# Patient Record
Sex: Female | Born: 1950 | Race: White | Hispanic: No | Smoking: Never smoker
Health system: Southern US, Community
[De-identification: ages and names within clinical notes are randomized; demographics above are authoritative.]

## PROBLEM LIST (undated history)

## (undated) DIAGNOSIS — F330 Major depressive disorder, recurrent, mild: Secondary | ICD-10-CM

## (undated) DIAGNOSIS — G4730 Sleep apnea, unspecified: Secondary | ICD-10-CM

## (undated) DIAGNOSIS — J8489 Other specified interstitial pulmonary diseases: Secondary | ICD-10-CM

## (undated) DIAGNOSIS — K589 Irritable bowel syndrome without diarrhea: Secondary | ICD-10-CM

## (undated) DIAGNOSIS — E782 Mixed hyperlipidemia: Secondary | ICD-10-CM

## (undated) DIAGNOSIS — R519 Headache, unspecified: Secondary | ICD-10-CM

## (undated) DIAGNOSIS — M51379 Other intervertebral disc degeneration, lumbosacral region without mention of lumbar back pain or lower extremity pain: Secondary | ICD-10-CM

## (undated) DIAGNOSIS — E042 Nontoxic multinodular goiter: Secondary | ICD-10-CM

## (undated) DIAGNOSIS — K219 Gastro-esophageal reflux disease without esophagitis: Secondary | ICD-10-CM

## (undated) DIAGNOSIS — F32A Depression, unspecified: Secondary | ICD-10-CM

## (undated) DIAGNOSIS — J45909 Unspecified asthma, uncomplicated: Secondary | ICD-10-CM

## (undated) DIAGNOSIS — I10 Essential (primary) hypertension: Secondary | ICD-10-CM

## (undated) DIAGNOSIS — U071 COVID-19: Secondary | ICD-10-CM

## (undated) HISTORY — PX: ABDOMINAL HYSTERECTOMY: SHX81

## (undated) HISTORY — PX: CHOLECYSTECTOMY: SHX55

## (undated) HISTORY — PX: OTHER SURGICAL HISTORY: SHX169

## (undated) HISTORY — PX: FRENULECTOMY, LINGUAL: SHX1681

## (undated) HISTORY — PX: APPENDECTOMY: SHX54

## (undated) HISTORY — PX: ELBOW BURSA SURGERY: SHX615

## (undated) HISTORY — PX: OOPHORECTOMY: SHX86

---

## 1994-12-27 HISTORY — PX: LUMBAR DISC SURGERY: SHX700

## 1996-12-27 HISTORY — PX: LUMBAR LAMINECTOMY: SHX95

## 1999-12-28 HISTORY — PX: UVULOPALATOPHARYNGOPLASTY: SHX827

## 2002-12-27 HISTORY — PX: LUMBAR DISC SURGERY: SHX700

## 2004-12-21 ENCOUNTER — Emergency Department: Payer: Self-pay | Admitting: Emergency Medicine

## 2004-12-27 HISTORY — PX: LUMBAR FUSION: SHX111

## 2005-01-01 ENCOUNTER — Ambulatory Visit: Payer: Self-pay | Admitting: Orthopedic Surgery

## 2005-01-14 ENCOUNTER — Inpatient Hospital Stay (HOSPITAL_COMMUNITY): Admission: RE | Admit: 2005-01-14 | Discharge: 2005-01-17 | Payer: Self-pay | Admitting: Orthopedic Surgery

## 2005-04-14 ENCOUNTER — Ambulatory Visit: Payer: Self-pay | Admitting: Otolaryngology

## 2005-05-15 ENCOUNTER — Inpatient Hospital Stay: Payer: Self-pay | Admitting: Internal Medicine

## 2005-05-15 ENCOUNTER — Other Ambulatory Visit: Payer: Self-pay

## 2005-05-16 ENCOUNTER — Other Ambulatory Visit: Payer: Self-pay

## 2005-10-25 ENCOUNTER — Inpatient Hospital Stay (HOSPITAL_COMMUNITY): Admission: RE | Admit: 2005-10-25 | Discharge: 2005-11-01 | Payer: Self-pay | Admitting: Orthopedic Surgery

## 2005-11-19 ENCOUNTER — Ambulatory Visit: Payer: Self-pay

## 2005-11-29 ENCOUNTER — Ambulatory Visit: Payer: Self-pay | Admitting: Physical Medicine & Rehabilitation

## 2005-11-29 ENCOUNTER — Inpatient Hospital Stay (HOSPITAL_COMMUNITY): Admission: RE | Admit: 2005-11-29 | Discharge: 2005-12-29 | Payer: Self-pay | Admitting: Orthopedic Surgery

## 2006-01-03 ENCOUNTER — Ambulatory Visit: Payer: Self-pay

## 2006-01-06 ENCOUNTER — Ambulatory Visit: Payer: Self-pay

## 2006-01-10 ENCOUNTER — Ambulatory Visit: Payer: Self-pay

## 2006-01-13 ENCOUNTER — Ambulatory Visit: Payer: Self-pay

## 2006-01-26 ENCOUNTER — Ambulatory Visit: Payer: Self-pay | Admitting: Internal Medicine

## 2008-07-16 ENCOUNTER — Ambulatory Visit: Payer: Self-pay | Admitting: Internal Medicine

## 2008-10-08 ENCOUNTER — Ambulatory Visit: Payer: Self-pay | Admitting: Unknown Physician Specialty

## 2009-03-09 ENCOUNTER — Emergency Department: Payer: Self-pay

## 2009-10-15 ENCOUNTER — Ambulatory Visit: Payer: Self-pay | Admitting: Internal Medicine

## 2011-01-05 ENCOUNTER — Ambulatory Visit: Payer: Self-pay | Admitting: Internal Medicine

## 2012-02-28 ENCOUNTER — Ambulatory Visit: Payer: Self-pay | Admitting: Internal Medicine

## 2013-08-22 ENCOUNTER — Ambulatory Visit: Payer: Self-pay | Admitting: Family Medicine

## 2014-05-30 ENCOUNTER — Ambulatory Visit: Payer: Self-pay | Admitting: Unknown Physician Specialty

## 2014-05-31 LAB — PATHOLOGY REPORT

## 2014-09-10 ENCOUNTER — Ambulatory Visit: Payer: Self-pay | Admitting: Internal Medicine

## 2017-11-24 ENCOUNTER — Other Ambulatory Visit: Payer: Self-pay | Admitting: Internal Medicine

## 2017-11-24 DIAGNOSIS — Z1231 Encounter for screening mammogram for malignant neoplasm of breast: Secondary | ICD-10-CM

## 2017-12-29 ENCOUNTER — Ambulatory Visit: Payer: Medicare Other | Attending: Otolaryngology

## 2017-12-29 DIAGNOSIS — R0683 Snoring: Secondary | ICD-10-CM | POA: Diagnosis not present

## 2017-12-29 DIAGNOSIS — I491 Atrial premature depolarization: Secondary | ICD-10-CM | POA: Insufficient documentation

## 2017-12-29 DIAGNOSIS — G4733 Obstructive sleep apnea (adult) (pediatric): Secondary | ICD-10-CM | POA: Insufficient documentation

## 2018-01-18 ENCOUNTER — Ambulatory Visit
Admission: RE | Admit: 2018-01-18 | Discharge: 2018-01-18 | Disposition: A | Discharge status: Home / Self Care | Payer: Medicare Other | Source: Ambulatory Visit | Attending: Internal Medicine | Admitting: Internal Medicine

## 2018-01-18 ENCOUNTER — Encounter: Payer: Self-pay | Admitting: Radiology

## 2018-01-18 DIAGNOSIS — Z1231 Encounter for screening mammogram for malignant neoplasm of breast: Secondary | ICD-10-CM | POA: Diagnosis not present

## 2018-09-26 ENCOUNTER — Emergency Department: Payer: Medicare Other

## 2018-09-26 ENCOUNTER — Encounter: Payer: Self-pay | Admitting: Emergency Medicine

## 2018-09-26 ENCOUNTER — Emergency Department
Admission: EM | Admit: 2018-09-26 | Discharge: 2018-09-26 | Disposition: A | Discharge status: Home / Self Care | Payer: Medicare Other | Attending: Emergency Medicine | Admitting: Emergency Medicine

## 2018-09-26 DIAGNOSIS — E860 Dehydration: Secondary | ICD-10-CM | POA: Diagnosis not present

## 2018-09-26 DIAGNOSIS — R42 Dizziness and giddiness: Secondary | ICD-10-CM | POA: Diagnosis present

## 2018-09-26 DIAGNOSIS — I10 Essential (primary) hypertension: Secondary | ICD-10-CM | POA: Diagnosis not present

## 2018-09-26 DIAGNOSIS — R55 Syncope and collapse: Secondary | ICD-10-CM | POA: Diagnosis not present

## 2018-09-26 LAB — CBC
HEMATOCRIT: 38.5 % (ref 35.0–47.0)
HEMOGLOBIN: 13.5 g/dL (ref 12.0–16.0)
MCH: 33 pg (ref 26.0–34.0)
MCHC: 35 g/dL (ref 32.0–36.0)
MCV: 94.1 fL (ref 80.0–100.0)
Platelets: 268 10*3/uL (ref 150–440)
RBC: 4.09 MIL/uL (ref 3.80–5.20)
RDW: 13.3 % (ref 11.5–14.5)
WBC: 8 10*3/uL (ref 3.6–11.0)

## 2018-09-26 LAB — URINALYSIS, COMPLETE (UACMP) WITH MICROSCOPIC
BILIRUBIN URINE: NEGATIVE
Glucose, UA: NEGATIVE mg/dL
HGB URINE DIPSTICK: NEGATIVE
Ketones, ur: NEGATIVE mg/dL
Leukocytes, UA: NEGATIVE
Nitrite: NEGATIVE
PROTEIN: NEGATIVE mg/dL
Specific Gravity, Urine: 1.011 (ref 1.005–1.030)
pH: 6 (ref 5.0–8.0)

## 2018-09-26 LAB — TROPONIN I

## 2018-09-26 LAB — BASIC METABOLIC PANEL
ANION GAP: 8 (ref 5–15)
BUN: 23 mg/dL (ref 8–23)
CALCIUM: 9.1 mg/dL (ref 8.9–10.3)
CO2: 26 mmol/L (ref 22–32)
Chloride: 106 mmol/L (ref 98–111)
Creatinine, Ser: 1.27 mg/dL — ABNORMAL HIGH (ref 0.44–1.00)
GFR calc non Af Amer: 43 mL/min — ABNORMAL LOW (ref >60)
GFR, EST AFRICAN AMERICAN: 50 mL/min — AB (ref >60)
GLUCOSE: 89 mg/dL (ref 70–99)
POTASSIUM: 4.2 mmol/L (ref 3.5–5.1)
Sodium: 140 mmol/L (ref 135–145)

## 2018-09-26 LAB — GLUCOSE, CAPILLARY: GLUCOSE-CAPILLARY: 83 mg/dL (ref 70–99)

## 2018-09-26 LAB — TSH: TSH: 3.956 u[IU]/mL (ref 0.350–4.500)

## 2018-09-26 LAB — T4, FREE: Free T4: 0.68 ng/dL — ABNORMAL LOW (ref 0.82–1.77)

## 2018-09-26 MED ORDER — SODIUM CHLORIDE 0.9 % IV BOLUS
1000.0000 mL | Freq: Once | INTRAVENOUS | Status: AC
  Administered 2018-09-26: 1000 mL via INTRAVENOUS

## 2018-09-26 NOTE — ED Triage Notes (Signed)
Pt had a sudden onset of shortness of breath and dizziness this morning. Pt attributed it to her blood sugar and ate an early lunch. Pt states she didn't feel any better and felt "foggy." Pt checked her blood sugar and it was 93 and bp was 90/42. Pt states she has had increased weakness/dizziness of the last few weeks. Pt states it's debilitated her from performing her ADLS

## 2018-09-26 NOTE — ED Notes (Signed)
Pt describes a feeling "like my blood sugar was low" with some generalized weakness and dizziness and diaphoresis followed by some slight sob which was not resolved by eating.  Pt reports her CBG was 93 when she checked it.  Pt reports some generalized weakness and fatigue ongoing for 6 weeks.  No focal weakness, numbness, no facial droop, no change in vision.  Pt is alert and oriented

## 2018-09-26 NOTE — ED Provider Notes (Signed)
[facility] Emergency Department Provider Note  ____________________________________________  Time seen: Approximately 8:39 PM  I have reviewed the triage vital signs and the nursing notes.   HISTORY  Chief Complaint Dizziness   HPI Jody Brewer is a 67 y.o. female with a history of OSA on CPAP, hypertension no longer medications since weight loss, IBS, and asthma who presents for evaluation of dizziness.  Patient reports that since the summer she has been having episodes of feeling off balance.  These episodes last a second or 2.  Over the last 2 weeks they have become more pronounced.  She has been extremely fatigued for 2 weeks and has not gone to the gym which she usually goes regularly.  Today she was vacuuming and she started feeling dizzy like she was going to pass out, she broke out in a sweat and felt clammy.  She felt that her sugar was down.  Patient reports having a history of hypoglycemia since losing 70 pounds several years ago.  She reports that she had a full meal which usually makes her feel better but she was not better.  She then check her blood glucose which was 85 and she thought it was low considering she had just had a full meal.  She check her blood pressure which was also low with systolics in the 90s.  Patient reports that her blood pressure at home is usually in the 130s.  She reports blurry vision and reports that that has been progressively worse over several months.  No headache although she does have a history of chronic headaches due to sinus infections, no chest pain, no palpitations, no shortness of breath, no abdominal pain, no nausea, no vomiting.  No personal family history of stroke.  Patient is not a smoker.  PMH OSA on CPAP 11/07/2017  LPRD (laryngopharyngeal reflux disease) 03/19/2015  Benign essential HTN 09/18/2014  DDD (degenerative disc disease), lumbosacral 09/18/2014  IBS (irritable bowel syndrome)      Past  Surgical History:  Procedure Laterality Date  . ABDOMINAL HYSTERECTOMY    . OOPHORECTOMY     1 ovary removed    Prior to Admission medications   Not on File    Allergies Patient has no allergy information on record.  FH High blood pressure (Hypertension) Brother    Alzheimer's disease Father    High blood pressure (Hypertension) Father    Parkinsonism Father    Emphysema Mother    High blood pressure (Hypertension) Sister Sister   High blood pressure (Hypertension) Sister       Social History Smoking - never Alcohol - no Drugs - no  Review of Systems  Constitutional: Negative for fever. + dizziness, near syncope, fatigue Eyes: Negative for visual changes. ENT: Negative for sore throat. Neck: No neck pain  Cardiovascular: Negative for chest pain. Respiratory: Negative for shortness of breath. Gastrointestinal: Negative for abdominal pain, vomiting or diarrhea. Genitourinary: Negative for dysuria. Musculoskeletal: Negative for back pain. Skin: Negative for rash. Neurological: Negative for headaches, weakness or numbness. Psych: No SI or HI  ____________________________________________   PHYSICAL EXAM:  VITAL SIGNS: ED Triage Vitals  Enc Vitals Group     BP 09/26/18 1535 136/60     Pulse Rate 09/26/18 1535 70     Resp 09/26/18 1535 18     Temp 09/26/18 1535 98.7 F (37.1 C)     Temp Source 09/26/18 1535 Oral     SpO2 09/26/18 1535 98 %  Weight 09/26/18 1535 168 lb (76.2 kg)     Height 09/26/18 1535 5\' 7"  (1.702 m)     Head Circumference --      Peak Flow --      Pain Score 09/26/18 1541 0     Pain Loc --      Pain Edu? --      Excl. in GC? --     Constitutional: Alert and oriented. Well appearing and in no apparent distress. HEENT:      Head: Normocephalic and atraumatic.         Eyes: Conjunctivae are normal. Sclera is non-icteric.       Mouth/Throat: Mucous membranes are moist.       Neck: Supple with no signs of  meningismus. Cardiovascular: Regular rate and rhythm. No murmurs, gallops, or rubs. 2+ symmetrical distal pulses are present in all extremities. No JVD. Respiratory: Normal respiratory effort. Lungs are clear to auscultation bilaterally. No wheezes, crackles, or rhonchi.  Gastrointestinal: Soft, non tender, and non distended with positive bowel sounds. No rebound or guarding. Musculoskeletal: Nontender with normal range of motion in all extremities. No edema, cyanosis, or erythema of extremities. Neurologic: Normal speech and language. Face is symmetric.  Intact strength and sensation all 4 extremities, patient has very slight right upper extremity pronator drift, no dysmetria. Skin: Skin is warm, dry and intact. No rash noted. Psychiatric: Mood and affect are normal. Speech and behavior are normal.  ____________________________________________   LABS (all labs ordered are listed, but only abnormal results are displayed)  Labs Reviewed  BASIC METABOLIC PANEL - Abnormal; Notable for the following components:      Result Value   Creatinine, Ser 1.27 (*)    GFR calc non Af Amer 43 (*)    GFR calc Af Amer 50 (*)    All other components within normal limits  URINALYSIS, COMPLETE (UACMP) WITH MICROSCOPIC - Abnormal; Notable for the following components:   Color, Urine STRAW (*)    APPearance CLEAR (*)    Bacteria, UA RARE (*)    All other components within normal limits  T4, FREE - Abnormal; Notable for the following components:   Free T4 0.68 (*)    All other components within normal limits  CBC  GLUCOSE, CAPILLARY  TROPONIN I  TSH  CBG MONITORING, ED   ____________________________________________  EKG  ED ECG REPORT I, Nita Sickle, the attending physician, personally viewed and interpreted this ECG.  Normal sinus rhythm, rate of 74, normal intervals, normal axis, no ST elevations or depressions. Normal EKG ____________________________________________  RADIOLOGY  I  have personally reviewed the images performed during this visit and I agree with the Radiologist's read.   Interpretation by Radiologist:  Ct Head Wo Contrast  Result Date: 09/26/2018 CLINICAL DATA:  Acute weakness and dizziness, vertigo EXAM: CT HEAD WITHOUT CONTRAST TECHNIQUE: Contiguous axial images were obtained from the base of the skull through the vertex without intravenous contrast. COMPARISON:  None. FINDINGS: Brain: Minor white matter microvascular ischemic changes about the periventricular white matter and the left frontal subcortical white matter. No acute intracranial hemorrhage, definite new infarction, mass lesion, midline shift, herniation, hydrocephalus, or extra-axial collection. No focal mass effect or edema. Cisterns are patent. Minor cerebellar atrophy. Vascular: No hyperdense vessel or unexpected calcification. Skull: Normal. Negative for fracture or focal lesion. Sinuses/Orbits: No acute finding. Other: None. IMPRESSION: Minor chronic white matter microvascular ischemic changes. No acute intracranial abnormality by noncontrast CT. Electronically Signed   By: Judie Petit.  Shick M.D.   On: 09/26/2018 20:34   Mr Brain Wo Contrast  Result Date: 09/26/2018 CLINICAL DATA:  67 y/o F; sudden onset shortness of breath, dizziness, and weakness starting this morning. Vertigo, episodic, peripheral. EXAM: MRI HEAD WITHOUT CONTRAST TECHNIQUE: Multiplanar, multiecho pulse sequences of the brain and surrounding structures were obtained without intravenous contrast. COMPARISON:  09/26/2018 CT head. FINDINGS: Brain: No acute infarction, hemorrhage, hydrocephalus, extra-axial collection or mass lesion. 5 mm well-circumscribed structure with fluid signal on all sequences centered in left lateral frontoparietal white matter without surrounding signal abnormality (series 10, image 17 and series 9, image 17). Few nonspecific T2 FLAIR hyperintensities in subcortical and periventricular white matter compatible with  mild chronic microvascular ischemic changes and can be seen associated with migraine headache. Mild volume loss of the brain for age. Vascular: Normal flow voids. Skull and upper cervical spine: Normal marrow signal. Sinuses/Orbits: Negative. Other: None. IMPRESSION: 1. No acute intracranial abnormality identified. 2. Few nonspecific T2 FLAIR hyperintensities in subcortical and periventricular white matter compatible with mild chronic microvascular ischemic changes and can be seen associated with migraine headache. 3. Mild volume loss of the brain for age. 4. 5 mm cyst in left frontoparietal white matter, likely a benign cyst such as neuroglial cyst or prominent perivascular space. Electronically Signed   By: Mitzi Hansen M.D.   On: 09/26/2018 22:42    ____________________________________________   PROCEDURES  Procedure(s) performed: None Procedures Critical Care performed:  None ____________________________________________   INITIAL IMPRESSION / ASSESSMENT AND PLAN / ED COURSE  a 67 y.o. female with a history of OSA on CPAP, hypertension no longer medications since weight loss, IBS, and asthma who presents for evaluation of dizziness, lightheadedness, fatigue, and a near syncopal episode today.  Patient is well-appearing, no distress she has normal vital signs, neuro exam shows right upper extremity pronator drift and no other neurological deficits.  Patient is complaining of blurry vision and very short-lived episodes of feeling off balance for several months therefore we will send patient for head CT and if that is negative we will most likely pursue an MRI to rule out stroke versus brain mass.  Will check labs to rule out thyroid dysfunction, anemia, AKI, electrolyte abnormalities, urinary tract infection.  EKG shows no evidence of dysrhythmias or ischemia.    Clinical Course as of Sep 26 2309  Tue Sep 26, 2018  2046 Labs showing mild acute kidney injury.  Will give IV fluids.   CT head is negative but due to neuro deficit found on exam we will proceed with an MRI.   [CV]  2308 MRI with no evidence of mass or stroke.  Free T4 is slightly low at 0.68 with a normal TSH.  Recommended recheck by primary care doctor in a few days.  Will not start patient on thyroid medicine at this time.  At this time presentation is concerning for mild dehydration and acute kidney injury.  Patient was given fluids.  Will discharge home with follow-up with primary care doctor.  Discussed return precautions and increase oral hydration.   [CV]    Clinical Course User Index [CV] Don Perking [place], MD     As part of my medical decision making, I reviewed the following data within the electronic MEDICAL RECORD NUMBER Nursing notes reviewed and incorporated, Labs reviewed , EKG interpreted , Old EKG reviewed, Old chart reviewed, Radiograph reviewed , Notes from prior ED visits and [place] Controlled Substance Database    Pertinent labs & imaging results that  were available during my care of the patient were reviewed by me and considered in my medical decision making (see chart for details).    ____________________________________________   FINAL CLINICAL IMPRESSION(S) / ED DIAGNOSES  Final diagnoses:  Near syncope  Dehydration      NEW MEDICATIONS STARTED DURING THIS VISIT:  ED Discharge Orders    None       Note:  This document was prepared using Dragon voice recognition software and may include unintentional dictation errors.    Nita Sickle, MD 09/26/18 [id number]

## 2018-11-08 ENCOUNTER — Ambulatory Visit
Admission: RE | Admit: 2018-11-08 | Discharge: 2018-11-08 | Disposition: A | Discharge status: Home / Self Care | Payer: Medicare Other | Source: Ambulatory Visit | Attending: Internal Medicine | Admitting: Internal Medicine

## 2018-11-08 ENCOUNTER — Other Ambulatory Visit: Payer: Self-pay | Admitting: Internal Medicine

## 2018-11-08 DIAGNOSIS — R0609 Other forms of dyspnea: Principal | ICD-10-CM

## 2018-11-08 DIAGNOSIS — I2510 Atherosclerotic heart disease of native coronary artery without angina pectoris: Secondary | ICD-10-CM | POA: Diagnosis not present

## 2018-11-08 MED ORDER — IOHEXOL 350 MG/ML SOLN
75.0000 mL | Freq: Once | INTRAVENOUS | Status: AC | PRN
  Administered 2018-11-08: 75 mL via INTRAVENOUS

## 2018-12-12 ENCOUNTER — Other Ambulatory Visit: Payer: Self-pay | Admitting: Internal Medicine

## 2018-12-12 DIAGNOSIS — Z1231 Encounter for screening mammogram for malignant neoplasm of breast: Secondary | ICD-10-CM

## 2019-01-23 ENCOUNTER — Ambulatory Visit
Admission: RE | Admit: 2019-01-23 | Discharge: 2019-01-23 | Disposition: A | Discharge status: Home / Self Care | Payer: Medicare Other | Source: Ambulatory Visit | Attending: Internal Medicine | Admitting: Internal Medicine

## 2019-01-23 DIAGNOSIS — Z1231 Encounter for screening mammogram for malignant neoplasm of breast: Secondary | ICD-10-CM | POA: Diagnosis not present

## 2019-11-27 DIAGNOSIS — U071 COVID-19: Secondary | ICD-10-CM

## 2019-11-27 HISTORY — DX: COVID-19: U07.1

## 2019-12-27 ENCOUNTER — Other Ambulatory Visit: Payer: Self-pay

## 2019-12-27 ENCOUNTER — Observation Stay
Admission: EM | Admit: 2019-12-27 | Discharge: 2019-12-28 | Disposition: A | Discharge status: Home / Self Care | Payer: Medicare Other | Attending: Family Medicine | Admitting: Family Medicine

## 2019-12-27 ENCOUNTER — Emergency Department: Payer: Medicare Other

## 2019-12-27 DIAGNOSIS — I10 Essential (primary) hypertension: Secondary | ICD-10-CM | POA: Diagnosis not present

## 2019-12-27 DIAGNOSIS — R0602 Shortness of breath: Secondary | ICD-10-CM | POA: Insufficient documentation

## 2019-12-27 DIAGNOSIS — J4520 Mild intermittent asthma, uncomplicated: Secondary | ICD-10-CM | POA: Diagnosis present

## 2019-12-27 DIAGNOSIS — U071 COVID-19: Principal | ICD-10-CM

## 2019-12-27 DIAGNOSIS — J9601 Acute respiratory failure with hypoxia: Secondary | ICD-10-CM

## 2019-12-27 DIAGNOSIS — Z20828 Contact with and (suspected) exposure to other viral communicable diseases: Secondary | ICD-10-CM | POA: Diagnosis not present

## 2019-12-27 DIAGNOSIS — J40 Bronchitis, not specified as acute or chronic: Secondary | ICD-10-CM | POA: Diagnosis not present

## 2019-12-27 DIAGNOSIS — B9729 Other coronavirus as the cause of diseases classified elsewhere: Secondary | ICD-10-CM | POA: Insufficient documentation

## 2019-12-27 HISTORY — DX: Essential (primary) hypertension: I10

## 2019-12-27 HISTORY — DX: Unspecified asthma, uncomplicated: J45.909

## 2019-12-27 LAB — HEPATIC FUNCTION PANEL
ALT: 22 U/L (ref 0–44)
AST: 29 U/L (ref 15–41)
Albumin: 4.1 g/dL (ref 3.5–5.0)
Alkaline Phosphatase: 92 U/L (ref 38–126)
Bilirubin, Direct: <0.1 mg/dL (ref 0.0–0.2)
Total Bilirubin: 0.6 mg/dL (ref 0.3–1.2)
Total Protein: 8.1 g/dL (ref 6.5–8.1)

## 2019-12-27 LAB — BASIC METABOLIC PANEL
Anion gap: 11 (ref 5–15)
BUN: 13 mg/dL (ref 8–23)
CO2: 26 mmol/L (ref 22–32)
Calcium: 9.2 mg/dL (ref 8.9–10.3)
Chloride: 101 mmol/L (ref 98–111)
Creatinine, Ser: 1.05 mg/dL — ABNORMAL HIGH (ref 0.44–1.00)
GFR calc Af Amer: >60 mL/min (ref >60)
GFR calc non Af Amer: 55 mL/min — ABNORMAL LOW (ref >60)
Glucose, Bld: 102 mg/dL — ABNORMAL HIGH (ref 70–99)
Potassium: 3.7 mmol/L (ref 3.5–5.1)
Sodium: 138 mmol/L (ref 135–145)

## 2019-12-27 LAB — CBC
HCT: 42.8 % (ref 36.0–46.0)
Hemoglobin: 14.1 g/dL (ref 12.0–15.0)
MCH: 29.7 pg (ref 26.0–34.0)
MCHC: 32.9 g/dL (ref 30.0–36.0)
MCV: 90.1 fL (ref 80.0–100.0)
Platelets: 252 10*3/uL (ref 150–400)
RBC: 4.75 MIL/uL (ref 3.87–5.11)
RDW: 12.4 % (ref 11.5–15.5)
WBC: 6.8 10*3/uL (ref 4.0–10.5)
nRBC: 0 % (ref 0.0–0.2)

## 2019-12-27 LAB — LACTIC ACID, PLASMA: Lactic Acid, Venous: 1.4 mmol/L (ref 0.5–1.9)

## 2019-12-27 LAB — POC SARS CORONAVIRUS 2 AG: SARS Coronavirus 2 Ag: NEGATIVE

## 2019-12-27 LAB — RESPIRATORY PANEL BY RT PCR (FLU A&B, COVID)
Influenza A by PCR: NEGATIVE
Influenza B by PCR: NEGATIVE
SARS Coronavirus 2 by RT PCR: POSITIVE — AB

## 2019-12-27 MED ORDER — HYDROCHLOROTHIAZIDE 12.5 MG PO CAPS
12.5000 mg | ORAL_CAPSULE | Freq: Every day | ORAL | Status: DC
  Administered 2019-12-27 – 2019-12-28 (×2): 12.5 mg via ORAL
  Filled 2019-12-27 (×4): qty 1

## 2019-12-27 MED ORDER — HYDROCOD POLST-CPM POLST ER 10-8 MG/5ML PO SUER
5.0000 mL | Freq: Two times a day (BID) | ORAL | Status: DC | PRN
  Administered 2019-12-28: 5 mL via ORAL
  Filled 2019-12-27: qty 5

## 2019-12-27 MED ORDER — ENOXAPARIN SODIUM 40 MG/0.4ML ~~LOC~~ SOLN
40.0000 mg | SUBCUTANEOUS | Status: DC
  Administered 2019-12-27: 23:00:00 40 mg via SUBCUTANEOUS
  Filled 2019-12-27 (×2): qty 0.4

## 2019-12-27 MED ORDER — GUAIFENESIN-DM 100-10 MG/5ML PO SYRP
10.0000 mL | ORAL_SOLUTION | ORAL | Status: DC | PRN
  Filled 2019-12-27: qty 10

## 2019-12-27 MED ORDER — ZINC SULFATE 220 (50 ZN) MG PO CAPS
220.0000 mg | ORAL_CAPSULE | Freq: Every day | ORAL | Status: DC
  Administered 2019-12-27 – 2019-12-28 (×2): 220 mg via ORAL
  Filled 2019-12-27 (×2): qty 1

## 2019-12-27 MED ORDER — OLMESARTAN MEDOXOMIL-HCTZ 20-12.5 MG PO TABS: 1.0000 | ORAL_TABLET | Freq: Every day | ORAL | Status: DC

## 2019-12-27 MED ORDER — OXYCODONE HCL 5 MG PO TABS: 5.0000 mg | ORAL_TABLET | ORAL | Status: DC | PRN

## 2019-12-27 MED ORDER — MONTELUKAST SODIUM 10 MG PO TABS
10.0000 mg | ORAL_TABLET | Freq: Every day | ORAL | Status: DC
  Administered 2019-12-27: 10 mg via ORAL
  Filled 2019-12-27 (×3): qty 1

## 2019-12-27 MED ORDER — NORTRIPTYLINE HCL 25 MG PO CAPS
25.0000 mg | ORAL_CAPSULE | Freq: Every day | ORAL | Status: DC
  Administered 2019-12-27: 25 mg via ORAL
  Filled 2019-12-27 (×2): qty 1

## 2019-12-27 MED ORDER — ALBUTEROL SULFATE HFA 108 (90 BASE) MCG/ACT IN AERS
6.0000 | INHALATION_SPRAY | Freq: Once | RESPIRATORY_TRACT | Status: DC
  Filled 2019-12-27: qty 6.7

## 2019-12-27 MED ORDER — ACETAMINOPHEN 325 MG PO TABS: 650.0000 mg | ORAL_TABLET | Freq: Once | ORAL | Status: AC

## 2019-12-27 MED ORDER — ASCORBIC ACID 500 MG PO TABS
500.0000 mg | ORAL_TABLET | Freq: Every day | ORAL | Status: DC
  Administered 2019-12-27 – 2019-12-28 (×2): 500 mg via ORAL
  Filled 2019-12-27 (×2): qty 1

## 2019-12-27 MED ORDER — IRBESARTAN 150 MG PO TABS
150.0000 mg | ORAL_TABLET | Freq: Every day | ORAL | Status: DC
  Administered 2019-12-27: 150 mg via ORAL
  Filled 2019-12-27 (×3): qty 1

## 2019-12-27 MED ORDER — ALBUTEROL SULFATE HFA 108 (90 BASE) MCG/ACT IN AERS
2.0000 | INHALATION_SPRAY | Freq: Four times a day (QID) | RESPIRATORY_TRACT | Status: DC | PRN
  Administered 2019-12-28: 2 via RESPIRATORY_TRACT
  Filled 2019-12-27: qty 6.7

## 2019-12-27 MED ORDER — IBUPROFEN 600 MG PO TABS
600.0000 mg | ORAL_TABLET | Freq: Once | ORAL | Status: AC
  Administered 2019-12-27: 14:00:00 600 mg via ORAL
  Filled 2019-12-27: qty 1

## 2019-12-27 MED ORDER — ACETAMINOPHEN 325 MG PO TABS
650.0000 mg | ORAL_TABLET | Freq: Four times a day (QID) | ORAL | Status: DC | PRN
  Administered 2019-12-28: 650 mg via ORAL
  Filled 2019-12-27: qty 2

## 2019-12-27 MED ORDER — VENLAFAXINE HCL ER 75 MG PO CP24
75.0000 mg | ORAL_CAPSULE | Freq: Every day | ORAL | Status: DC
  Administered 2019-12-27: 75 mg via ORAL
  Filled 2019-12-27 (×3): qty 1

## 2019-12-27 MED ORDER — PANTOPRAZOLE SODIUM 40 MG PO TBEC
40.0000 mg | DELAYED_RELEASE_TABLET | Freq: Two times a day (BID) | ORAL | Status: DC
  Administered 2019-12-27 – 2019-12-28 (×2): 40 mg via ORAL
  Filled 2019-12-27 (×2): qty 1

## 2019-12-27 MED ORDER — ONDANSETRON HCL 4 MG/2ML IJ SOLN: 4.0000 mg | Freq: Four times a day (QID) | INTRAMUSCULAR | Status: DC | PRN

## 2019-12-27 MED ORDER — ACETAMINOPHEN 325 MG PO TABS
ORAL_TABLET | ORAL | Status: AC
  Administered 2019-12-27: 650 mg via ORAL
  Filled 2019-12-27: qty 2

## 2019-12-27 MED ORDER — ONDANSETRON HCL 4 MG PO TABS
4.0000 mg | ORAL_TABLET | Freq: Four times a day (QID) | ORAL | Status: DC | PRN
  Filled 2019-12-27: qty 1

## 2019-12-27 NOTE — ED Notes (Signed)
Pt given belongings family brought from home including dinner.

## 2019-12-27 NOTE — ED Triage Notes (Addendum)
Pt sent from [facility] with low O2 in the low 80's per MD, pt c/o cough since sinus and chest congestion since Saturday, states she went to the clinic on Tuesday and was treated for bronchitis put on Augmentin and states she started loosing her taste and smell and had a fever that night, states she was feeling worse so returned to [facility] today. Pt arrived on 2L [place] at 99-100%. Pt is able to speak in complete sentences. Pt was given 1g Rocephin and 80mg  Kenalog IM at the clinic today

## 2019-12-27 NOTE — ED Notes (Signed)
Pt reports not feeling well since last Saturday with fevers, weakness and cough. PCP started pt on augmentin Monday for URI. Dyspnea noted with rest and exertion.

## 2019-12-27 NOTE — ED Notes (Addendum)
RN reached out to respiratory. Pt reports she wears CPAP at night but respiratory confirms pt can not use CPAP unless in a negative pressure room and even then it is discouraged if a Scott has not been attempted. Pt made aware.

## 2019-12-27 NOTE — ED Notes (Signed)
Pharmacy contacted to send medications

## 2019-12-27 NOTE — ED Notes (Signed)
MD at bedside. 

## 2019-12-27 NOTE — ED Provider Notes (Signed)
[facility] Emergency Department Provider Note  ____________________________________________   First MD Initiated Contact with Patient 12/27/19 1320     (approximate)  I have reviewed the triage vital signs and the nursing notes.   HISTORY  Chief Complaint Shortness of Breath    HPI Jody Brewer is a 68 y.o. female  With h/o HTN, asthma here with cough, SOB. Pt has been sick for the last  6 days. Sx started as fever, sinus and chest congestion, and mild SOB on Saturday. She presented to clinic on Tuesday and was placed on Augmentin, has been taking it but has sincel ost her taste, appetite, and had worsening fatigue. She was seen at [facility] today and became hypoxic, was given Rocephin, kenolog and sent here. No other complaints. No abd pain, nausea, vomiting. She feels markedly dyspneic during coughing spells and with exertion. No alleviating factors.       Past Medical History:  Diagnosis Date  . Asthma   . Hypertension     There are no problems to display for this patient.   Past Surgical History:  Procedure Laterality Date  . ABDOMINAL HYSTERECTOMY    . OOPHORECTOMY     1 ovary removed    Prior to Admission medications   Not on File    Allergies Patient has no known allergies.  No family history on file.  Social History Social History   Tobacco Use  . Smoking status: Never Smoker  . Smokeless tobacco: Never Used  Substance Use Topics  . Alcohol use: Not Currently  . Drug use: Not Currently    Review of Systems  Review of Systems  Constitutional: Positive for fatigue. Negative for chills and fever.  HENT: Positive for congestion and rhinorrhea. Negative for sore throat.   Respiratory: Positive for cough and shortness of breath.   Cardiovascular: Negative for chest pain.  Gastrointestinal: Negative for abdominal pain.  Genitourinary: Negative for flank pain.  Musculoskeletal: Negative for neck pain.  Skin: Negative for  rash and wound.  Allergic/Immunologic: Negative for immunocompromised state.  Neurological: Positive for weakness. Negative for numbness.  Hematological: Does not bruise/bleed easily.  All other systems reviewed and are negative.    ____________________________________________  PHYSICAL EXAM:      VITAL SIGNS: ED Triage Vitals  Enc Vitals Group     BP 12/27/19 1056 (!) 161/87     Pulse Rate 12/27/19 1056 (!) 101     Resp 12/27/19 1056 18     Temp 12/27/19 1102 (!) 102.8 F (39.3 C)     Temp Source 12/27/19 1056 Oral     SpO2 12/27/19 1102 98 %     Weight 12/27/19 1056 182 lb (82.6 kg)     Height 12/27/19 1056 5\' 7"  (1.702 m)     Head Circumference --      Peak Flow --      Pain Score 12/27/19 1056 5     Pain Loc --      Pain Edu? --      Excl. in GC? --      Physical Exam Vitals and nursing note reviewed.  Constitutional:      General: She is not in acute distress.    Appearance: She is well-developed.  HENT:     Head: Normocephalic and atraumatic.  Eyes:     Conjunctiva/sclera: Conjunctivae normal.  Cardiovascular:     Rate and Rhythm: Normal rate and regular rhythm.     Heart sounds: Normal heart  sounds. No murmur. No friction rub.  Pulmonary:     Effort: Pulmonary effort is normal. No respiratory distress.     Breath sounds: Examination of the right-upper field reveals wheezing. Examination of the left-upper field reveals wheezing. Examination of the right-middle field reveals wheezing. Examination of the left-middle field reveals wheezing. Examination of the right-lower field reveals wheezing. Examination of the left-lower field reveals wheezing. Wheezing present. No rhonchi or rales.  Abdominal:     General: There is no distension.     Palpations: Abdomen is soft.     Tenderness: There is no abdominal tenderness.  Musculoskeletal:     Cervical back: Neck supple.  Skin:    General: Skin is warm.     Capillary Refill: Capillary refill takes less than 2  seconds.  Neurological:     Mental Status: She is alert and oriented to person, place, and time.     Motor: No abnormal muscle tone.       ____________________________________________   LABS (all labs ordered are listed, but only abnormal results are displayed)  Labs Reviewed  RESPIRATORY PANEL BY RT PCR (FLU A&B, COVID) - Abnormal; Notable for the following components:      Result Value   SARS Coronavirus 2 by RT PCR POSITIVE (*)    All other components within normal limits  BASIC METABOLIC PANEL - Abnormal; Notable for the following components:   Glucose, Bld 102 (*)    Creatinine, Ser 1.05 (*)    GFR calc non Af Amer 55 (*)    All other components within normal limits  CBC  HEPATIC FUNCTION PANEL  LACTIC ACID, PLASMA  POC SARS CORONAVIRUS 2 AG -  ED  POC SARS CORONAVIRUS 2 AG    ____________________________________________  EKG: None ________________________________________  RADIOLOGY All imaging, including plain films, CT scans, and ultrasounds, independently reviewed by me, and interpretations confirmed via formal radiology reads.  ED MD interpretation:   CXR: No active disease  Official radiology report(s): DG Chest 2 View  Result Date: 12/27/2019 CLINICAL DATA:  Cough, congestion, shortness of breath EXAM: CHEST - 2 VIEW COMPARISON:  12/10/2005 FINDINGS: Linear atelectasis in the lung bases. Heart is normal size. No effusions or acute bony abnormality. Degenerative changes and postoperative changes in the thoracic spine. IMPRESSION: No active cardiopulmonary disease. Electronically Signed   By: Charlett NoseKevin  Dover M.D.   On: 12/27/2019 11:44    ____________________________________________  PROCEDURES   Procedure(s) performed (including Critical Care):  Procedures  ____________________________________________  INITIAL IMPRESSION / MDM / ASSESSMENT AND PLAN / ED COURSE  As part of my medical decision making, I reviewed the following data within the  electronic MEDICAL RECORD NUMBER Nursing notes reviewed and incorporated, Old chart reviewed, Notes from prior ED visits, and [place] Controlled Substance Database       *Jody Brewer was evaluated in Emergency Department on 12/27/2019 for the symptoms described in the history of present illness. She was evaluated in the context of the global COVID-19 pandemic, which necessitated consideration that the patient might be at risk for infection with the SARS-CoV-2 virus that causes COVID-19. Institutional protocols and algorithms that pertain to the evaluation of patients at risk for COVID-19 are in a state of rapid change based on information released by regulatory bodies including the CDC and federal and state organizations. These policies and algorithms were followed during the patient's care in the ED.  Some ED evaluations and interventions may be delayed as a result of limited staffing during the  pandemic.*  Clinical Course as of Dec 26 1548  Thu Dec 27, 2019  1536 68 yo F with h/o asthma here with COVID-19 related exertional hypoxia and dyspnea. H/o asthma so there could be a component of bronchospasm as her CXR is largely clear. No signs of bacterial superinfection.   [CI]  1547 With ambulation, pt desat into the 80s. Will place on supplemental O2 PRN and admit. Nebs ordered. She received Kenolog w her PCP. No PNA on CXR so will hold on Remdesevir.   [CI]  1548 Ir.   [CI]    Clinical Course User Index [CI] Duffy Bruce, MD    Medical Decision Making:  As above.  ____________________________________________  FINAL CLINICAL IMPRESSION(S) / ED DIAGNOSES  Final diagnoses:  COVID-19  Acute respiratory failure with hypoxemia ([place])     MEDICATIONS GIVEN DURING THIS VISIT:  Medications  albuterol (VENTOLIN HFA) 108 (90 Base) MCG/ACT inhaler 6 puff (has no administration in time range)  acetaminophen (TYLENOL) tablet 650 mg (650 mg Oral Given by Other 12/27/19 1120)  ibuprofen (ADVIL)  tablet 600 mg (600 mg Oral Given 12/27/19 1427)     ED Discharge Orders    None       Note:  This document was prepared using Dragon voice recognition software and may include unintentional dictation errors.   Duffy Bruce, MD 12/27/19 [phone]

## 2019-12-27 NOTE — ED Notes (Signed)
Pt lying in bed, NAD noted. Pt on RA, 96%

## 2019-12-27 NOTE — ED Notes (Signed)
Admitting MD at bedside.

## 2019-12-27 NOTE — ED Notes (Signed)
Pt given warm blanket and remote. Pt using phone at this time. No decrease in oxygen saturation after ambulating to bathroom.

## 2019-12-27 NOTE — ED Notes (Signed)
Pt ambulated on RA, SpO2 93%. Upon returning to bed, pt SpO2 86%, pt placed on 2 L [place]. Dr Ellender Hose aware.

## 2019-12-27 NOTE — ED Notes (Signed)
Pt up to the restroom. Pt able to ambulate with increased WOB. Coughing worsened when changing position.

## 2019-12-27 NOTE — H&P (Signed)
History and Physical  Patient Name: Jody Brewer     AYT:[id number]    DOB: 05-23-1951    DOA: 12/27/2019 PCP: Rusty Aus, MD  Patient coming from: Home  Chief Complaint: Cough, hypoxia from doctor's office      HPI: Jody Brewer is a 68 y.o. F with hx asthma, mild intermittent and HTN who presents with 1 week cough, malaise, URI symptoms.  Patient was in her usual state of health until 1 week ago, she started to have malaise, aches, cough, and sinus congestion.  This progressed until she had severe dry cough, headaches, and went to her PCPs office.  Their rapid antigen test was negative, but she was noted to desat to 85% with ambulation and so she was sent to the ER.  In the ER, chest x-ray clear, SPO2 99% on room air at rest, there was concern that she desaturated while lying in bed after ambulating.  Temp 102.24F.  Hemogram unremarkable, electrolytes and renal function normal.         ROS: Review of Systems  Constitutional: Positive for fever and malaise/fatigue.  HENT: Positive for congestion and sinus pain.   Respiratory: Positive for cough and shortness of breath. Negative for hemoptysis, sputum production and wheezing.   All other systems reviewed and are negative.         Past Medical History:  Diagnosis Date  . Asthma   . Hypertension     Past Surgical History:  Procedure Laterality Date  . ABDOMINAL HYSTERECTOMY    . APPENDECTOMY    . CHOLECYSTECTOMY    . OOPHORECTOMY     1 ovary removed    Social History: Patient lives with her husband.  The patient walks unassisted.  Never smoker.  No Known Allergies  Family history: family history includes Dementia in her father; Emphysema in her mother; Hypertension in her father; Parkinson's disease in her father.  Prior to Admission medications   Medication Sig Start Date End Date Taking? Authorizing Provider  albuterol (VENTOLIN HFA) 108 (90 Base) MCG/ACT inhaler Inhale 1 puff into the lungs  every 6 (six) hours as needed for wheezing or shortness of breath.   Yes [provider]  cetirizine (ZYRTEC) 10 MG tablet Take 10 mg by mouth daily.   Yes [provider]  cholecalciferol (VITAMIN D3) 25 MCG (1000 UT) tablet Take 1,000 Units by mouth 2 (two) times daily.   Yes [provider]  estradiol (ESTRACE) 0.5 MG tablet Take 0.5 mg by mouth daily. 10/15/19  Yes [provider]  montelukast (SINGULAIR) 10 MG tablet Take 10 mg by mouth daily. 10/15/19  Yes [provider]  Multiple Vitamin (MULTI-VITAMIN) tablet Take 1 tablet by mouth daily.   Yes [provider]  nortriptyline (PAMELOR) 25 MG capsule Take 25 mg by mouth at bedtime. 11/26/19  Yes [provider]  olmesartan-hydrochlorothiazide (BENICAR HCT) 20-12.5 MG tablet Take 1 tablet by mouth daily. 11/17/19  Yes [provider]  pantoprazole (PROTONIX) 40 MG tablet Take 40 mg by mouth 2 (two) times daily. 10/15/19  Yes [provider]  venlafaxine XR (EFFEXOR-XR) 75 MG 24 hr capsule Take 75 mg by mouth daily. 10/15/19  Yes [provider]  vitamin E 400 UNIT capsule Take 400 Units by mouth daily.   Yes [provider]       Physical Exam: BP 139/61   Pulse 87   Temp (!) 102.8 F (39.3 C) (Oral)   Resp 19  Ht 5\' 7"  (1.702 m)   Wt 82.6 kg   SpO2 94%   BMI 28.51 kg/m  General appearance: Well-developed, adult female, alert and in mild distress from coughing.   Eyes: Anicteric, conjunctiva pink, lids and lashes normal. PERRL.    ENT: No nasal deformity, discharge, epistaxis.  Hearing normal. OP moist without lesions.   Neck: No neck masses.  Trachea midline.  No thyromegaly/tenderness. Lymph: No cervical or supraclavicular lymphadenopathy. Skin: Warm and dry.  No jaundice.  No suspicious rashes or lesions. Cardiac: RRR, nl S1-S2, no murmurs appreciated.  Capillary refill is brisk.  JVP normal.  No LE edema.  Radial pulses 2+  and symmetric. Respiratory: Normal respiratory rate and rhythm, very labored with walking just around the bed twice.  CTAB without rales or wheezes. Abdomen: Abdomen soft.  No TTP Harding. No ascites, distension, hepatosplenomegaly.   MSK: No deformities or effusions of the large joints of the upper or lower extremities bilaterally.  No cyanosis or clubbing. Neuro: Cranial nerves 3 through 12 intact.  Sensation intact to light touch. Speech is fluent.  Muscle strength normal.   Gait normal. Psych: Sensorium intact and responding to questions, attention normal.  Behavior appropriate.  Affect blunted.  Judgment and insight appear normal.     Labs on Admission:  I have personally reviewed following labs and imaging studies: CBC: Recent Labs  Lab 12/27/19 1109  WBC 6.8  HGB 14.1  HCT 42.8  MCV 90.1  PLT 252   Basic Metabolic Panel: Recent Labs  Lab 12/27/19 1109  NA 138  K 3.7  CL 101  CO2 26  GLUCOSE 102*  BUN 13  CREATININE 1.05*  CALCIUM 9.2   GFR: Estimated Creatinine Clearance: 57.5 mL/min (A) (by C-G formula based on SCr of 1.05 mg/dL (H)).  Liver Function Tests: Recent Labs  Lab 12/27/19 1348  AST 29  ALT 22  ALKPHOS 92  BILITOT 0.6  PROT 8.1  ALBUMIN 4.1   Sepsis Labs: Lactate 1.4  Recent Results (from the past 240 hour(s))  Respiratory Panel by RT PCR (Flu A&B, Covid) - Nasopharyngeal Swab     Status: Abnormal   Collection Time: 12/27/19  1:49 PM   Specimen: Nasopharyngeal Swab  Result Value Ref Range Status   SARS Coronavirus 2 by RT PCR POSITIVE (A) NEGATIVE Final    Comment: RESULT CALLED TO, READ BACK BY AND VERIFIED WITH: JUDITH CHAPMAN 12/27/19 1533 KLW (NOTE) SARS-CoV-2 target nucleic acids are DETECTED. SARS-CoV-2 RNA is generally detectable in upper respiratory specimens  during the acute phase of infection. Positive results are indicative of the presence of the identified virus, but do not rule out bacterial infection or co-infection with  other pathogens not detected by the test. Clinical correlation with patient history and other diagnostic information is necessary to determine patient infection status. The expected result is Negative. Fact Sheet for Patients:  https://www.moore.com/https://www.fda.gov/media/142436/download Fact Sheet for Healthcare Providers: https://www.young.biz/https://www.fda.gov/media/142435/download This test is not yet approved or cleared by the [place] FDA and  has been authorized for detection and/or diagnosis of SARS-CoV-2 by FDA under an Emergency Use Authorization (EUA).  This EUA will remain in effect (meaning this test can be used) for  the duration of  the COVID-19 declaration under Section 564(b)(1) of the Act, 21 U.S.C. section 360bbb-3(b)(1), unless the authorization is terminated or revoked sooner.    Influenza A by PCR NEGATIVE NEGATIVE Final   Influenza B by PCR NEGATIVE NEGATIVE Final    Comment: (NOTE) The  Xpert Xpress SARS-CoV-2/FLU/RSV assay is intended as an aid in  the diagnosis of influenza from Nasopharyngeal swab specimens and  should not be used as a sole basis for treatment. Nasal washings and  aspirates are unacceptable for Xpert Xpress SARS-CoV-2/FLU/RSV  testing. Fact Sheet for Patients: https://www.moore.com/ Fact Sheet for Healthcare Providers: https://www.young.biz/ This test is not yet approved or cleared by the [place] FDA and  has been authorized for detection and/or diagnosis of SARS-CoV-2 by  FDA under an Emergency Use Authorization (EUA). This EUA will remain  in effect (meaning this test can be used) for the duration of the  Covid-19 declaration under Section 564(b)(1) of the Act, 21  U.S.C. section 360bbb-3(b)(1), unless the authorization is  terminated or revoked. Performed at [facility], [address] [place], [place] [address]            Radiological Exams on Admission: Personally reviewed chest x-ray shows no focal  airspace disease or opacities: DG Chest 2 View  Result Date: 12/27/2019 CLINICAL DATA:  Cough, congestion, shortness of breath EXAM: CHEST - 2 VIEW COMPARISON:  12/10/2005 FINDINGS: Linear atelectasis in the lung bases. Heart is normal size. No effusions or acute bony abnormality. Degenerative changes and postoperative changes in the thoracic spine. IMPRESSION: No active cardiopulmonary disease. Electronically Signed   By: Charlett Nose M.D.   On: 12/27/2019 11:44           Assessment/Plan Principal Problem:   COVID-19 virus infection Active Problems:   Essential hypertension   Asthma, intermittent, uncomplicated   COVID-19    Coronavirus bronchitis without hypoxemia at present Patient presents with 1 week malaise, dry cough, headaches in the setting of in setting of ongoing 2020 COVID-19 pandemic.  Antigen testing negative here but SARS-CoV-2 PCR positive.  Pulse ox is 97 to 99% on room air, and I am unable to reproduce desaturations even with having her ambulate in the room at this time.  Given concerns by her PCP and her family, I am comfortable observing overnight, to monitor for further desaturations.  If she does develop hypoxia, would start remdesivir and steroids. -VTE PPx with Lovenox -Zinc and Vitamin C    Hypertension Blood pressure elevated -Continue olmesartan and hydrochlorothiazide  Asthma, mild, intermittent -Continue Singulair -Albuterol as needed -Continue PPI  Mood disorder -Continue venlafaxine, nortriptyline      DVT prophylaxis: Lovenox Code Status: Full code Family Communication: Husband by phone Disposition Plan: Anticipate observation overnight for desaturation.  She is stable on room air, will get home tomorrow with antitussives, otherwise will start treatment for moderate Covid. Consults called:  Admission status: OBS   At the point of initial evaluation, it is my clinical opinion that admission for OBSERVATION is reasonable and  necessary because the patient's presenting complaints in the context of their chronic conditions represent sufficient risk of deterioration or significant morbidity to constitute reasonable grounds for close observation in the hospital setting, but that the patient may be medically stable for discharge from the hospital within 24 to 48 hours.     Medical decision making: Patient seen at 4:14 PM on 12/27/2019.  The patient was discussed with Dr. Erma Heritage.  What exists of the patient's chart was reviewed in depth and summarized above.  Clinical condition: Stable.        Earl Lites Xavior Niazi [facility] Pager: please page via [email or web address]  Contact charge nurse if [employer] needed

## 2019-12-28 DIAGNOSIS — U071 COVID-19: Secondary | ICD-10-CM | POA: Diagnosis not present

## 2019-12-28 DIAGNOSIS — I10 Essential (primary) hypertension: Secondary | ICD-10-CM | POA: Diagnosis not present

## 2019-12-28 DIAGNOSIS — J4520 Mild intermittent asthma, uncomplicated: Secondary | ICD-10-CM | POA: Diagnosis not present

## 2019-12-28 MED ORDER — HYDROCOD POLST-CPM POLST ER 10-8 MG/5ML PO SUER: 5.0000 mL | Freq: Two times a day (BID) | ORAL | 0 refills | Status: AC | PRN

## 2019-12-28 NOTE — Discharge Summary (Signed)
Physician Discharge Summary  Jody Brewer ZOX:[id number] DOB: 26-Aug-1951 DOA: 12/27/2019  PCP: Jody Brewer, Jody F, MD  Admit date: 12/27/2019 Discharge date: 12/28/2019  Admitted From: Home  Disposition:  Home   Recommendations for Outpatient Follow-up:  1. Follow up with PCP Dr. Hyacinth Brewer in 1-2 weeks     Home Health: None  Equipment/Devices: Pulse Ox for home checks  Discharge Condition: Fair  CODE STATUS: FULL Diet recommendation: Regular  Brief/Interim Summary: Jody Brewer is a 69 y.o. F with hx asthma, mild intermittent and HTN who presents with 1 week cough, malaise, URI symptoms.  Patient was in her usual state of health until 1 week ago, she started to have malaise, aches, cough, and sinus congestion.  This progressed until she had severe dry cough, headaches, and went to her PCPs office.  Their rapid antigen test was negative, but she was noted to desat to 85% with ambulation and so she was sent to the ER.  In the ER, chest x-ray clear, SPO2 99% on room air at rest, there was concern that she desaturated while lying in bed after ambulating.  Temp 102.1F.  Hemogram unremarkable, electrolytes and renal function normal.    Given the discrepancy between her normal SpO2 in the ER and the reported desaturation at PCP's office, the hospitalist service were asked to observe her overnight.      PRINCIPAL HOSPITAL DIAGNOSIS: COVID-19    Discharge Diagnoses:   Coronavirus bronchitis without hypoxemia at present Patient presents with 1 week malaise, dry cough, headaches in the setting of in setting of ongoing 2020 COVID-19 pandemic.  She was again negative by antigen testing here but positive by SARS-CoV-2 PCR.  She was observed overnight.  CXR was clear.  Pulse ox remained 97 to 99% on room air and with ambulation.  We were unable to reproduce desaturations with exertion.  While sleeping (she was unable to use her prescribed CPAP due to COVID protocols) she desaturated  briefly, but this is expected in the setting of OSA.      Hypertension  Asthma, mild, intermittent No wheezing on exam.     Mood disorder          Discharge Instructions  Discharge Instructions    Discharge instructions   Complete by: As directed    You were diagnosed with coronavirus (Also known as COVID-19)  Your chest x-ray shows no pneumonia, and other than when you were sleeping without your CPAP, your oxygen levels stayed normal.  This is reassuring.  If you have any lingering cough, you should take the cough syrup we gave you here, either the prescription medicine Tussionex (which contains hydrocodone, a narcotic cough syrup) or the over-the-counter Robitussin (with the ingredients "GUIAFENESIN" and "DEXTROMETHORPHAN")   You should purchase a pulse oximeter at your pharmacy. This is a device that you put on your finger to measure your oxygen level.  They are available at any pharmacy. Use it to check your oxygen level twice daily until you see your primary care doctor. If your oxygen level is ever LESS than 88% and doesn't get better, you should call your primary care doctor.   HOW LONG TO REMAIN IN QUARANTINE: There is no absolutely correct answer to this and so our best answer is to be on the cautious side.  Based on what we know of the virus, you should isolate strictly until 10 days from your first symptoms and at least 24 hours after your last fever  Your cough will likely linger  for several weeks  Until you end your quarantine: If you have anyone in the home who has NOT had coronavirus:    -do not be in the same room with them until your self isolation is over    -wear a mask and have them wear a mask if you MUST be in the same room    -clean all hard surfaces (counters, doors, tables) twice a day    -use a separate bathroom at all times   Increase activity slowly   Complete by: As directed      Allergies as of 12/28/2019   No Known Allergies      Medication List    TAKE these medications   albuterol 108 (90 Base) MCG/ACT inhaler Commonly known as: VENTOLIN HFA Inhale 1 puff into the lungs every 6 (six) hours as needed for wheezing or shortness of breath.   cetirizine 10 MG tablet Commonly known as: ZYRTEC Take 10 mg by mouth daily.   chlorpheniramine-HYDROcodone 10-8 MG/5ML Suer Commonly known as: TUSSIONEX Take 5 mLs by mouth every 12 (twelve) hours as needed for up to 5 days (for severe cough).   cholecalciferol 25 MCG (1000 UT) tablet Commonly known as: VITAMIN D3 Take 1,000 Units by mouth 2 (two) times daily.   estradiol 0.5 MG tablet Commonly known as: ESTRACE Take 0.5 mg by mouth daily.   montelukast 10 MG tablet Commonly known as: SINGULAIR Take 10 mg by mouth daily.   Multi-Vitamin tablet Take 1 tablet by mouth daily.   nortriptyline 25 MG capsule Commonly known as: PAMELOR Take 25 mg by mouth at bedtime.   olmesartan-hydrochlorothiazide 20-12.5 MG tablet Commonly known as: BENICAR HCT Take 1 tablet by mouth daily.   pantoprazole 40 MG tablet Commonly known as: PROTONIX Take 40 mg by mouth 2 (two) times daily.   venlafaxine XR 75 MG 24 hr capsule Commonly known as: EFFEXOR-XR Take 75 mg by mouth daily.   vitamin E 400 UNIT capsule Take 400 Units by mouth daily.       No Known Allergies  Consultations:     Procedures/Studies: DG Chest 2 View  Result Date: 12/27/2019 CLINICAL DATA:  Cough, congestion, shortness of breath EXAM: CHEST - 2 VIEW COMPARISON:  12/10/2005 FINDINGS: Linear atelectasis in the lung bases. Heart is normal size. No effusions or acute bony abnormality. Degenerative changes and postoperative changes in the thoracic spine. IMPRESSION: No active cardiopulmonary disease. Electronically Signed   By: Charlett Nose M.D.   On: 12/27/2019 11:44       Subjective: Still coughing, no sputum or hemoptysis.  Still tired, and chills overnight.  No confusion, vomiting,  respiratory distress.  Discharge Exam: Vitals:   12/27/19 2245 12/28/19 0923  BP: 114/68 121/74  Pulse: 69 81  Resp: 15 17  Temp:  97.9 F (36.6 C)  SpO2: 95% 95%   Vitals:   12/27/19 2030 12/27/19 2100 12/27/19 2245 12/28/19 0923  BP: (!) 125/58 129/69 114/68 121/74  Pulse: 77 77 69 81  Resp: 18 15 15 17   Temp:    97.9 F (36.6 C)  TempSrc:    Oral  SpO2: 92% 98% 95% 95%  Weight:      Height:        General: Pt is alert, awake, not in acute distress, appears tired, lying in bed Cardiovascular: RRR, nl S1-S2, no murmurs appreciated.   No LE edema.   Respiratory: Normal respiratory rate and rhythm.  CTAB without rales or wheezes. Abdominal: Abdomen soft  and non-tender.  No distension or HSM.   Neuro/Psych: Strength symmetric in upper and lower extremities.  Judgment and insight appear normal.   The results of significant diagnostics from this hospitalization (including imaging, microbiology, ancillary and laboratory) are listed below for reference.     Microbiology: Recent Results (from the past 240 hour(s))  Respiratory Panel by RT PCR (Flu A&B, Covid) - Nasopharyngeal Swab     Status: Abnormal   Collection Time: 12/27/19  1:49 PM   Specimen: Nasopharyngeal Swab  Result Value Ref Range Status   SARS Coronavirus 2 by RT PCR POSITIVE (A) NEGATIVE Final    Comment: RESULT CALLED TO, READ BACK BY AND VERIFIED WITH: JUDITH CHAPMAN 12/27/19 1533 KLW (NOTE) SARS-CoV-2 target nucleic acids are DETECTED. SARS-CoV-2 RNA is generally detectable in upper respiratory specimens  during the acute phase of infection. Positive results are indicative of the presence of the identified virus, but do not rule out bacterial infection or co-infection with other pathogens not detected by the test. Clinical correlation with patient history and other diagnostic information is necessary to determine patient infection status. The expected result is Negative. Fact Sheet for Patients:   PinkCheek.be Fact Sheet for Healthcare Providers: GravelBags.it This test is not yet approved or cleared by the [place] FDA and  has been authorized for detection and/or diagnosis of SARS-CoV-2 by FDA under an Emergency Use Authorization (EUA).  This EUA will remain in effect (meaning this test can be used) for  the duration of  the COVID-19 declaration under Section 564(b)(1) of the Act, 21 U.S.C. section 360bbb-3(b)(1), unless the authorization is terminated or revoked sooner.    Influenza A by PCR NEGATIVE NEGATIVE Final   Influenza B by PCR NEGATIVE NEGATIVE Final    Comment: (NOTE) The Xpert Xpress SARS-CoV-2/FLU/RSV assay is intended as an aid in  the diagnosis of influenza from Nasopharyngeal swab specimens and  should not be used as a sole basis for treatment. Nasal washings and  aspirates are unacceptable for Xpert Xpress SARS-CoV-2/FLU/RSV  testing. Fact Sheet for Patients: PinkCheek.be Fact Sheet for Healthcare Providers: GravelBags.it This test is not yet approved or cleared by the [place] FDA and  has been authorized for detection and/or diagnosis of SARS-CoV-2 by  FDA under an Emergency Use Authorization (EUA). This EUA will remain  in effect (meaning this test can be used) for the duration of the  Covid-19 declaration under Section 564(b)(1) of the Act, 21  U.S.C. section 360bbb-3(b)(1), unless the authorization is  terminated or revoked. Performed at [facility], [address] [place], [place] [address]      Labs: BNP (last 3 results) No results for input(s): BNP in the last 8760 hours. Basic Metabolic Panel: Recent Labs  Lab 12/27/19 1109  NA 138  K 3.7  CL 101  CO2 26  GLUCOSE 102*  BUN 13  CREATININE 1.05*  CALCIUM 9.2   Liver Function Tests: Recent Labs  Lab 12/27/19 1348  AST 29  ALT 22  ALKPHOS 92   BILITOT 0.6  PROT 8.1  ALBUMIN 4.1   No results for input(s): LIPASE, AMYLASE in the last 168 hours. No results for input(s): AMMONIA in the last 168 hours. CBC: Recent Labs  Lab 12/27/19 1109  WBC 6.8  HGB 14.1  HCT 42.8  MCV 90.1  PLT 252   Cardiac Enzymes: No results for input(s): CKTOTAL, CKMB, CKMBINDEX, TROPONINI in the last 168 hours. BNP: Invalid input(s): POCBNP CBG: No results for input(s): GLUCAP  in the last 168 hours. D-Dimer No results for input(s): DDIMER in the last 72 hours. Hgb A1c No results for input(s): HGBA1C in the last 72 hours. Lipid Profile No results for input(s): CHOL, HDL, LDLCALC, TRIG, CHOLHDL, LDLDIRECT in the last 72 hours. Thyroid function studies No results for input(s): TSH, T4TOTAL, T3FREE, THYROIDAB in the last 72 hours.  Invalid input(s): FREET3 Anemia work up No results for input(s): VITAMINB12, FOLATE, FERRITIN, TIBC, IRON, RETICCTPCT in the last 72 hours. Urinalysis    Component Value Date/Time   COLORURINE STRAW (A) 09/26/2018 2000   APPEARANCEUR CLEAR (A) 09/26/2018 2000   LABSPEC 1.011 09/26/2018 2000   PHURINE 6.0 09/26/2018 2000   GLUCOSEU NEGATIVE 09/26/2018 2000   HGBUR NEGATIVE 09/26/2018 2000   BILIRUBINUR NEGATIVE 09/26/2018 2000   KETONESUR NEGATIVE 09/26/2018 2000   PROTEINUR NEGATIVE 09/26/2018 2000   NITRITE NEGATIVE 09/26/2018 2000   LEUKOCYTESUR NEGATIVE 09/26/2018 2000   Sepsis Labs Invalid input(s): PROCALCITONIN,  WBC,  LACTICIDVEN Microbiology Recent Results (from the past 240 hour(s))  Respiratory Panel by RT PCR (Flu A&B, Covid) - Nasopharyngeal Swab     Status: Abnormal   Collection Time: 12/27/19  1:49 PM   Specimen: Nasopharyngeal Swab  Result Value Ref Range Status   SARS Coronavirus 2 by RT PCR POSITIVE (A) NEGATIVE Final    Comment: RESULT CALLED TO, READ BACK BY AND VERIFIED WITH: JUDITH CHAPMAN 12/27/19 1533 KLW (NOTE) SARS-CoV-2 target nucleic acids are DETECTED. SARS-CoV-2 RNA is  generally detectable in upper respiratory specimens  during the acute phase of infection. Positive results are indicative of the presence of the identified virus, but do not rule out bacterial infection or co-infection with other pathogens not detected by the test. Clinical correlation with patient history and other diagnostic information is necessary to determine patient infection status. The expected result is Negative. Fact Sheet for Patients:  https://www.moore.com/ Fact Sheet for Healthcare Providers: https://www.young.biz/ This test is not yet approved or cleared by the [place] FDA and  has been authorized for detection and/or diagnosis of SARS-CoV-2 by FDA under an Emergency Use Authorization (EUA).  This EUA will remain in effect (meaning this test can be used) for  the duration of  the COVID-19 declaration under Section 564(b)(1) of the Act, 21 U.S.C. section 360bbb-3(b)(1), unless the authorization is terminated or revoked sooner.    Influenza A by PCR NEGATIVE NEGATIVE Final   Influenza B by PCR NEGATIVE NEGATIVE Final    Comment: (NOTE) The Xpert Xpress SARS-CoV-2/FLU/RSV assay is intended as an aid in  the diagnosis of influenza from Nasopharyngeal swab specimens and  should not be used as a sole basis for treatment. Nasal washings and  aspirates are unacceptable for Xpert Xpress SARS-CoV-2/FLU/RSV  testing. Fact Sheet for Patients: https://www.moore.com/ Fact Sheet for Healthcare Providers: https://www.young.biz/ This test is not yet approved or cleared by the [place] FDA and  has been authorized for detection and/or diagnosis of SARS-CoV-2 by  FDA under an Emergency Use Authorization (EUA). This EUA will remain  in effect (meaning this test can be used) for the duration of the  Covid-19 declaration under Section 564(b)(1) of the Act, 21  U.S.C. section 360bbb-3(b)(1), unless the  authorization is  terminated or revoked. Performed at [facility], [address] [place], [place] [address]      Time coordinating discharge: 25 minutes The [place] controlled substances registry was reviewed for this patient.      SIGNED:   Alberteen Sam, MD  [facility] 12/28/2019, 11:25 AM

## 2019-12-28 NOTE — ED Notes (Signed)
Patient placed on 2L [place] because O2 sats were 88% on RA. 2L [place] brought patient up to 98%

## 2019-12-28 NOTE — Progress Notes (Addendum)
Reviewed AVS with patient who has no questions at this time. She will go home with her husband and isolate per the guidelines recommended by Dr. Maryfrances Bunnell. She understands she can get over the counter cough medicine and a pulse ox, and that she should call her PCP (Dr. Hyacinth Meeker) with any questions or concerns. Patient is adequate for discharge at this time.  Patient will share COVID-19 paperwork with daughter and husband and again, isolate appropriately. VSS.

## 2019-12-28 NOTE — Progress Notes (Signed)
Pulse oximetry on room air at rest is 97&. Pulse oximetry on room air ambulating is 100%. Pulse oximetry while patient coughing on room air is 98%.   Patient experienced severe dyspnea while ambulating after a coughing spell, but recovered quickly with rest on room air.

## 2020-01-05 ENCOUNTER — Emergency Department
Admission: EM | Admit: 2020-01-05 | Discharge: 2020-01-05 | Disposition: A | Discharge status: Home / Self Care | Payer: Medicare Other | Attending: Emergency Medicine | Admitting: Emergency Medicine

## 2020-01-05 ENCOUNTER — Emergency Department: Payer: Medicare Other

## 2020-01-05 ENCOUNTER — Encounter: Payer: Self-pay | Admitting: Emergency Medicine

## 2020-01-05 ENCOUNTER — Other Ambulatory Visit: Payer: Self-pay

## 2020-01-05 DIAGNOSIS — J45909 Unspecified asthma, uncomplicated: Secondary | ICD-10-CM | POA: Diagnosis not present

## 2020-01-05 DIAGNOSIS — Z79899 Other long term (current) drug therapy: Secondary | ICD-10-CM | POA: Diagnosis not present

## 2020-01-05 DIAGNOSIS — U071 COVID-19: Secondary | ICD-10-CM | POA: Insufficient documentation

## 2020-01-05 DIAGNOSIS — J1289 Other viral pneumonia: Secondary | ICD-10-CM | POA: Insufficient documentation

## 2020-01-05 DIAGNOSIS — J189 Pneumonia, unspecified organism: Secondary | ICD-10-CM

## 2020-01-05 DIAGNOSIS — R059 Cough, unspecified: Secondary | ICD-10-CM

## 2020-01-05 DIAGNOSIS — R509 Fever, unspecified: Secondary | ICD-10-CM | POA: Diagnosis present

## 2020-01-05 DIAGNOSIS — R05 Cough: Secondary | ICD-10-CM

## 2020-01-05 DIAGNOSIS — I10 Essential (primary) hypertension: Secondary | ICD-10-CM | POA: Insufficient documentation

## 2020-01-05 NOTE — ED Provider Notes (Signed)
[facility] Emergency Department Provider Note   ____________________________________________   First MD Initiated Contact with Patient 01/05/20 1301     (approximate)  I have reviewed the triage vital signs and the nursing notes.   HISTORY  Chief Complaint COVID positive and Fever    HPI Jody Brewer is a 69 y.o. female patient presents with fever and cough.  Patient was diagnosed with COVID-19 2 weeks ago.  Patient stated mild control with fever alternating Tylenol and ibuprofen.  Patient contacted her PCP yesterday and was prescribed Levaquin and steroids.  Patient also has a prescription for Tussionex.  Patient states only take Tussionex at night secondary to the drowsy effects.  Patient stated no relief taking Tessalon Perles.  Patient is a decreased energy.  Patient rates her pain discomfort as a 5/10.  Patient describes her pain as "achy".         Past Medical History:  Diagnosis Date  . Asthma   . Hypertension     Patient Active Problem List   Diagnosis Date Noted  . COVID-19 virus infection 12/27/2019  . Essential hypertension 12/27/2019  . Asthma, intermittent, uncomplicated 94/85/4627  . COVID-19 12/27/2019    Past Surgical History:  Procedure Laterality Date  . ABDOMINAL HYSTERECTOMY    . APPENDECTOMY    . CHOLECYSTECTOMY    . OOPHORECTOMY     1 ovary removed    Prior to Admission medications   Medication Sig Start Date End Date Taking? Authorizing Provider  albuterol (VENTOLIN HFA) 108 (90 Base) MCG/ACT inhaler Inhale 1 puff into the lungs every 6 (six) hours as needed for wheezing or shortness of breath.    [provider]  cetirizine (ZYRTEC) 10 MG tablet Take 10 mg by mouth daily.    [provider]  cholecalciferol (VITAMIN D3) 25 MCG (1000 UT) tablet Take 1,000 Units by mouth 2 (two) times daily.    [provider]  estradiol (ESTRACE) 0.5 MG tablet Take 0.5 mg by mouth daily. 10/15/19    [provider]  montelukast (SINGULAIR) 10 MG tablet Take 10 mg by mouth daily. 10/15/19   [provider]  Multiple Vitamin (MULTI-VITAMIN) tablet Take 1 tablet by mouth daily.    [provider]  nortriptyline (PAMELOR) 25 MG capsule Take 25 mg by mouth at bedtime. 11/26/19   [provider]  olmesartan-hydrochlorothiazide (BENICAR HCT) 20-12.5 MG tablet Take 1 tablet by mouth daily. 11/17/19   [provider]  pantoprazole (PROTONIX) 40 MG tablet Take 40 mg by mouth 2 (two) times daily. 10/15/19   [provider]  venlafaxine XR (EFFEXOR-XR) 75 MG 24 hr capsule Take 75 mg by mouth daily. 10/15/19   [provider]  vitamin E 400 UNIT capsule Take 400 Units by mouth daily.    [provider]    Allergies Patient has no known allergies.  Family History  Problem Relation Age of Onset  . Emphysema Mother   . Parkinson's disease Father   . Dementia Father   . Hypertension Father     Social History Social History   Tobacco Use  . Smoking status: Never Smoker  . Smokeless tobacco: Never Used  Substance Use Topics  . Alcohol use: Not Currently  . Drug use: Not Currently    Review of Systems  Constitutional: Fever. Eyes: No visual changes. ENT: No sore throat. Cardiovascular: Denies chest pain. Respiratory: Mild shortness of breath.  Nonproductive cough Gastrointestinal: No abdominal pain.  No nausea,  no vomiting.  No diarrhea.  No constipation. Genitourinary: Negative for dysuria. Musculoskeletal: Negative for back pain. Skin: Negative for rash. Neurological: Negative for headaches, focal weakness or numbness.   Endocrine:  Hypertension Hematological/Lymphatic:  Allergic/Immunilogical: **}  ____________________________________________   PHYSICAL EXAM:  VITAL SIGNS: ED Triage Vitals  Enc Vitals Group     BP 01/05/20 1245 (!) 147/84     Pulse Rate 01/05/20 1245 88     Resp 01/05/20 1245 20      Temp 01/05/20 1245 97.6 F (36.4 C)     Temp Source 01/05/20 1245 Oral     SpO2 01/05/20 1245 98 %     Weight 01/05/20 1246 178 lb (80.7 kg)     Height 01/05/20 1246 5\' 8"  (1.727 m)     Head Circumference --      Peak Flow --      Pain Score 01/05/20 1245 5     Pain Loc --      Pain Edu? --      Excl. in [place]? --     Constitutional: Alert and oriented. Well appearing and in no acute distress. Neck: No stridor.  Cardiovascular: Normal rate, regular rhythm. Grossly normal heart sounds.  Good peripheral circulation. Respiratory: Normal respiratory effort.  No retractions. Lungs CTAB. Skin:  Skin is warm, dry and intact. No rash noted. Psychiatric: Mood and affect are normal. Speech and behavior are normal.  ____________________________________________   LABS (all labs ordered are listed, but only abnormal results are displayed)  Labs Reviewed - No data to display ____________________________________________  EKG   ____________________________________________  RADIOLOGY  ED MD interpretation:    Official radiology report(s): DG Chest Port 1 View  Result Date: 01/05/2020 CLINICAL DATA:  COVID symptoms for 2 weeks. Patient diagnosed positive for COVID-19 December 26, 2019. EXAM: PORTABLE CHEST 1 VIEW COMPARISON:  None. FINDINGS: No pneumothorax. There is infiltrate in the right mid lower lung peripherally. The heart, hila, mediastinum, lungs, and pleura are otherwise unremarkable. IMPRESSION: Right-sided infiltrate consistent with pneumonia. Recommend short-term follow-up imaging to ensure resolution. Electronically Signed   By: 21 December III M.D   On: 01/05/2020 13:36    ____________________________________________   PROCEDURES  Procedure(s) performed (including Critical Care):  Procedures   ____________________________________________   INITIAL IMPRESSION / ASSESSMENT AND PLAN / ED COURSE  As part of my medical decision making, I reviewed the following data  within the electronic MEDICAL RECORD NUMBER     Patient presents with fever and cough status post diagnosed with COVID-19.  Patient chest x-ray is consistent with right lower lobe pneumonia.  Patient advised to continue medication prescribed yesterday by her PCP.  Increase Tussionex f to 5 mL twice a day.  Twice a day.  Follow-up PCP if no improvement in 3 days.  Advised to repeat chest x-ray in 10 days.   JYLA HOPF was evaluated in Emergency Department on 01/05/2020 for the symptoms described in the history of present illness. She was evaluated in the context of the global COVID-19 pandemic, which necessitated consideration that the patient might be at risk for infection with the SARS-CoV-2 virus that causes COVID-19. Institutional protocols and algorithms that pertain to the evaluation of patients at risk for COVID-19 are in a state of rapid change based on information released by regulatory bodies including the CDC and federal and state organizations. These policies and algorithms were followed during the patient's care in the ED.        ____________________________________________  FINAL CLINICAL IMPRESSION(S) / ED DIAGNOSES  Final diagnoses:  Community acquired pneumonia of right lower lobe of lung     ED Discharge Orders    None       Note:  This document was prepared using Dragon voice recognition software and may include unintentional dictation errors.    Joni Reining, PA-C 01/05/20 1407    Concha Se, MD 01/06/20 [phone]

## 2020-01-05 NOTE — ED Notes (Signed)
First Nurse Note: Pt is COVID pos. Pt states that she has high fever. Pt SpO2 97% on room air.

## 2020-01-05 NOTE — ED Triage Notes (Signed)
Patient states has had COVID symptoms x 2 weeks, diagnosed on December 30th. States she is still having fevers and thought they would be done by this time. States fever max today 103 and has been alternating tylenol and ibuprofen. Afebrile at triage. States has been taking soups and other fluids po well.

## 2020-01-05 NOTE — Discharge Instructions (Addendum)
Follow discharge care instruction.  Continues Levaquin, prednisone, and increased Tussionex to twice a day.  Advised Tylenol 650 mg every 4-6 hours when temperatures over 100.  Follow-up with PCP in 2 days if no improvement.  Advised to have chest x-ray again in 10 days.

## 2020-01-09 ENCOUNTER — Emergency Department: Payer: Medicare Other

## 2020-01-09 ENCOUNTER — Encounter: Payer: Self-pay | Admitting: Emergency Medicine

## 2020-01-09 ENCOUNTER — Inpatient Hospital Stay
Admission: EM | Admit: 2020-01-09 | Discharge: 2020-01-11 | DRG: 177 | Disposition: A | Discharge status: Home / Self Care | Payer: Medicare Other | Attending: Internal Medicine | Admitting: Internal Medicine

## 2020-01-09 ENCOUNTER — Other Ambulatory Visit: Payer: Self-pay

## 2020-01-09 DIAGNOSIS — U071 COVID-19: Principal | ICD-10-CM

## 2020-01-09 DIAGNOSIS — Z8249 Family history of ischemic heart disease and other diseases of the circulatory system: Secondary | ICD-10-CM

## 2020-01-09 DIAGNOSIS — J1282 Pneumonia due to coronavirus disease 2019: Secondary | ICD-10-CM | POA: Diagnosis present

## 2020-01-09 DIAGNOSIS — Z79899 Other long term (current) drug therapy: Secondary | ICD-10-CM

## 2020-01-09 DIAGNOSIS — J9601 Acute respiratory failure with hypoxia: Secondary | ICD-10-CM | POA: Diagnosis present

## 2020-01-09 DIAGNOSIS — R0602 Shortness of breath: Secondary | ICD-10-CM

## 2020-01-09 DIAGNOSIS — J189 Pneumonia, unspecified organism: Secondary | ICD-10-CM | POA: Diagnosis not present

## 2020-01-09 DIAGNOSIS — I10 Essential (primary) hypertension: Secondary | ICD-10-CM

## 2020-01-09 DIAGNOSIS — J45909 Unspecified asthma, uncomplicated: Secondary | ICD-10-CM | POA: Diagnosis present

## 2020-01-09 DIAGNOSIS — K219 Gastro-esophageal reflux disease without esophagitis: Secondary | ICD-10-CM | POA: Diagnosis present

## 2020-01-09 LAB — COMPREHENSIVE METABOLIC PANEL
ALT: 44 U/L (ref 0–44)
AST: 30 U/L (ref 15–41)
Albumin: 3.8 g/dL (ref 3.5–5.0)
Alkaline Phosphatase: 73 U/L (ref 38–126)
Anion gap: 12 (ref 5–15)
BUN: 29 mg/dL — ABNORMAL HIGH (ref 8–23)
CO2: 21 mmol/L — ABNORMAL LOW (ref 22–32)
Calcium: 9.8 mg/dL (ref 8.9–10.3)
Chloride: 104 mmol/L (ref 98–111)
Creatinine, Ser: 0.95 mg/dL (ref 0.44–1.00)
GFR calc Af Amer: >60 mL/min (ref >60)
GFR calc non Af Amer: >60 mL/min (ref >60)
Glucose, Bld: 108 mg/dL — ABNORMAL HIGH (ref 70–99)
Potassium: 4.3 mmol/L (ref 3.5–5.1)
Sodium: 137 mmol/L (ref 135–145)
Total Bilirubin: 0.9 mg/dL (ref 0.3–1.2)
Total Protein: 7.3 g/dL (ref 6.5–8.1)

## 2020-01-09 LAB — CBC WITH DIFFERENTIAL/PLATELET
Abs Immature Granulocytes: 0.08 10*3/uL — ABNORMAL HIGH (ref 0.00–0.07)
Basophils Absolute: 0 10*3/uL (ref 0.0–0.1)
Basophils Relative: 0 %
Eosinophils Absolute: 0 10*3/uL (ref 0.0–0.5)
Eosinophils Relative: 0 %
HCT: 39.3 % (ref 36.0–46.0)
Hemoglobin: 13.4 g/dL (ref 12.0–15.0)
Immature Granulocytes: 1 %
Lymphocytes Relative: 11 %
Lymphs Abs: 1.2 10*3/uL (ref 0.7–4.0)
MCH: 30.4 pg (ref 26.0–34.0)
MCHC: 34.1 g/dL (ref 30.0–36.0)
MCV: 89.1 fL (ref 80.0–100.0)
Monocytes Absolute: 0.5 10*3/uL (ref 0.1–1.0)
Monocytes Relative: 4 %
Neutro Abs: 9.2 10*3/uL — ABNORMAL HIGH (ref 1.7–7.7)
Neutrophils Relative %: 84 %
Platelets: 520 10*3/uL — ABNORMAL HIGH (ref 150–400)
RBC: 4.41 MIL/uL (ref 3.87–5.11)
RDW: 12.5 % (ref 11.5–15.5)
WBC: 11 10*3/uL — ABNORMAL HIGH (ref 4.0–10.5)
nRBC: 0 % (ref 0.0–0.2)

## 2020-01-09 LAB — LACTIC ACID, PLASMA: Lactic Acid, Venous: 1.8 mmol/L (ref 0.5–1.9)

## 2020-01-09 MED ORDER — SODIUM CHLORIDE 0.9 % IV SOLN
500.0000 mg | INTRAVENOUS | Status: DC
  Administered 2020-01-10: 500 mg via INTRAVENOUS
  Filled 2020-01-09: qty 500

## 2020-01-09 MED ORDER — IOHEXOL 350 MG/ML SOLN
100.0000 mL | Freq: Once | INTRAVENOUS | Status: AC | PRN
  Administered 2020-01-09: 100 mL via INTRAVENOUS

## 2020-01-09 MED ORDER — SODIUM CHLORIDE 0.9 % IV SOLN
200.0000 mg | Freq: Once | INTRAVENOUS | Status: AC
  Administered 2020-01-10: 200 mg via INTRAVENOUS
  Filled 2020-01-09: qty 200

## 2020-01-09 MED ORDER — ALBUTEROL SULFATE (2.5 MG/3ML) 0.083% IN NEBU: 5.0000 mg | INHALATION_SOLUTION | Freq: Once | RESPIRATORY_TRACT | Status: DC

## 2020-01-09 MED ORDER — DEXAMETHASONE SODIUM PHOSPHATE 10 MG/ML IJ SOLN
10.0000 mg | Freq: Once | INTRAMUSCULAR | Status: AC
  Administered 2020-01-09: 10 mg via INTRAVENOUS
  Filled 2020-01-09: qty 1

## 2020-01-09 MED ORDER — SODIUM CHLORIDE 0.9 % IV BOLUS
1000.0000 mL | Freq: Once | INTRAVENOUS | Status: AC
  Administered 2020-01-09: 1000 mL via INTRAVENOUS

## 2020-01-09 MED ORDER — SODIUM CHLORIDE 0.9 % IV SOLN
100.0000 mg | Freq: Every day | INTRAVENOUS | Status: DC
  Administered 2020-01-10 – 2020-01-11 (×2): 100 mg via INTRAVENOUS
  Filled 2020-01-09 (×2): qty 20
  Filled 2020-01-09: qty 100

## 2020-01-09 MED ORDER — SODIUM CHLORIDE 0.9 % IV SOLN
2.0000 g | INTRAVENOUS | Status: DC
  Administered 2020-01-10: 2 g via INTRAVENOUS
  Filled 2020-01-09: qty 20

## 2020-01-09 NOTE — ED Provider Notes (Signed)
[facility] Emergency Department Provider Note    First MD Initiated Contact with Patient 01/09/20 2319     (approximate)  I have reviewed the triage vital signs and the nursing notes.   HISTORY  Chief Complaint Fever    HPI Jody Brewer is a 68 y.o. female with below list of previous medical conditions including COVID-19 infection diagnosed on December 23 and subsequent right lower lobe pneumonia returns to the emergency department secondary to persistent fevers and progressive dyspnea.  Patient states her temperatures at home exceeded 102.  Patient also admits to right side chest pain.  Chest discomfort is worse with deep inspiration.  Patient denies any lower extremity pain or swelling no nausea vomiting diarrhea.        Past Medical History:  Diagnosis Date  . Asthma   . Hypertension     Patient Active Problem List   Diagnosis Date Noted  . COVID-19 virus infection 12/27/2019  . Essential hypertension 12/27/2019  . Asthma, intermittent, uncomplicated 93/90/3009  . COVID-19 12/27/2019    Past Surgical History:  Procedure Laterality Date  . ABDOMINAL HYSTERECTOMY    . APPENDECTOMY    . CHOLECYSTECTOMY    . OOPHORECTOMY     1 ovary removed    Prior to Admission medications   Medication Sig Start Date End Date Taking? Authorizing Provider  albuterol (VENTOLIN HFA) 108 (90 Base) MCG/ACT inhaler Inhale 1 puff into the lungs every 6 (six) hours as needed for wheezing or shortness of breath.    [provider]  cetirizine (ZYRTEC) 10 MG tablet Take 10 mg by mouth daily.    [provider]  cholecalciferol (VITAMIN D3) 25 MCG (1000 UT) tablet Take 1,000 Units by mouth 2 (two) times daily.    [provider]  estradiol (ESTRACE) 0.5 MG tablet Take 0.5 mg by mouth daily. 10/15/19   [provider]  montelukast (SINGULAIR) 10 MG tablet Take 10 mg by mouth daily. 10/15/19   [provider]    Multiple Vitamin (MULTI-VITAMIN) tablet Take 1 tablet by mouth daily.    [provider]  nortriptyline (PAMELOR) 25 MG capsule Take 25 mg by mouth at bedtime. 11/26/19   [provider]  olmesartan-hydrochlorothiazide (BENICAR HCT) 20-12.5 MG tablet Take 1 tablet by mouth daily. 11/17/19   [provider]  pantoprazole (PROTONIX) 40 MG tablet Take 40 mg by mouth 2 (two) times daily. 10/15/19   [provider]  venlafaxine XR (EFFEXOR-XR) 75 MG 24 hr capsule Take 75 mg by mouth daily. 10/15/19   [provider]  vitamin E 400 UNIT capsule Take 400 Units by mouth daily.    [provider]    Allergies Patient has no known allergies.  Family History  Problem Relation Age of Onset  . Emphysema Mother   . Parkinson's disease Father   . Dementia Father   . Hypertension Father     Social History Social History   Tobacco Use  . Smoking status: Never Smoker  . Smokeless tobacco: Never Used  Substance Use Topics  . Alcohol use: Not Currently  . Drug use: Not Currently    Review of Systems Constitutional: Positive fever/chills Eyes: No visual changes. ENT: No sore throat. Cardiovascular: Positive for chest pain. Respiratory: Positive for shortness of breath. Gastrointestinal: No abdominal pain.  No nausea, no vomiting.  No diarrhea.  No constipation. Genitourinary: Negative for dysuria. Musculoskeletal: Negative for neck pain.  Negative for back pain. Integumentary: Negative  for rash. Neurological: Negative for headaches, focal weakness or numbness.  ____________________________________________   PHYSICAL EXAM:  VITAL SIGNS: ED Triage Vitals  Enc Vitals Group     BP 01/09/20 1829 130/71     Pulse Rate 01/09/20 1829 87     Resp 01/09/20 1829 20     Temp 01/09/20 1829 99 F (37.2 C)     Temp Source 01/09/20 1829 Oral     SpO2 01/09/20 1829 94 %     Weight 01/09/20 1831 80.7 kg (177 lb 14.6 oz)     Height 01/09/20  1831 1.727 m ('5\' 8"'$ )     Head Circumference --      Peak Flow --      Pain Score 01/09/20 1829 7     Pain Loc --      Pain Edu? --      Excl. in [place]? --     Constitutional: Alert and oriented.  Apparent respiratory distress Eyes: Conjunctivae are normal.  Mouth/Throat: Patient is wearing a mask. Neck: No stridor.  No meningeal signs.   Cardiovascular: Normal rate, regular rhythm. Good peripheral circulation. Grossly normal heart sounds. Respiratory: Tachypnea, diffuse rhonchi worse in the right lung fields.  Positive accessory muscle use. Gastrointestinal: Soft and nontender. No distention.  Musculoskeletal: No lower extremity tenderness nor edema. No gross deformities of extremities. Neurologic:  Normal speech and language. No gross focal neurologic deficits are appreciated.  Skin:  Skin is warm, dry and intact. Psychiatric: Mood and affect are normal. Speech and behavior are normal.  ____________________________________________   LABS (all labs ordered are listed, but only abnormal results are displayed)  Labs Reviewed  CBC WITH DIFFERENTIAL/PLATELET - Abnormal; Notable for the following components:      Result Value   WBC 11.0 (*)    Platelets 520 (*)    Neutro Abs 9.2 (*)    Abs Immature Granulocytes 0.08 (*)    All other components within normal limits  COMPREHENSIVE METABOLIC PANEL - Abnormal; Notable for the following components:   CO2 21 (*)    Glucose, Bld 108 (*)    BUN 29 (*)    All other components within normal limits  CULTURE, BLOOD (ROUTINE X 2)  CULTURE, BLOOD (ROUTINE X 2)  LACTIC ACID, PLASMA  APTT  PROTIME-INR   ____________________________________________  EKG  ED ECG REPORT I, Iaeger N Yavuz Kirby, the attending physician, personally viewed and interpreted this ECG.   Date: 01/09/2020  EKG Time: 6:24 PM  Rate: 89  Rhythm: Normal sinus rhythm  Axis: Normal  Intervals: Normal  ST&T Change:  None  ____________________________________________  RADIOLOGY I, [place] Ernst Bowler, personally viewed and evaluated these images (plain radiographs) as part of my medical decision making, as well as reviewing the written report by the radiologist.  ED MD interpretation: Creased right lateral pneumonia since 01/05/2020.  Official radiology report(s): DG Chest Port 1 View  Result Date: 01/09/2020 CLINICAL DATA:  69 year old female with pneumonia, switched antibiotics recently. Persistent fever. Right side pleuritic pain. Positive for COVID-19. Last month. EXAM: PORTABLE CHEST 1 VIEW COMPARISON:  Portable chest 01/05/2020 and earlier. FINDINGS: Portable AP upright view at 2301 hours. Continued patchy and streaky opacity in the right lower lung. Density has mildly increased since 01/05/2020. No superimposed pneumothorax or pleural effusion. Elsewhere lung markings are stable. Normal lung volumes and mediastinal contours. Partially visible thoracolumbar posterior fusion hardware. IMPRESSION: Increased right lower lung pneumonia since 01/05/2020. No pleural effusion. Electronically Signed   By: Genevie Ann  M.D.   On: 01/09/2020 23:25    ____________________________________________   PROCEDURES     .Critical Care Performed by: Gregor Hams, MD Authorized by: Gregor Hams, MD   Critical care provider statement:    Critical care time (minutes):  30   Critical care time was exclusive of:  Separately billable procedures and treating other patients   Critical care was necessary to treat or prevent imminent or life-threatening deterioration of the following conditions:  Respiratory failure   Critical care was time spent personally by me on the following activities:  Development of treatment plan with patient or surrogate, discussions with consultants, evaluation of patient's response to treatment, examination of patient, obtaining history from patient or surrogate, ordering and performing  treatments and interventions, ordering and review of laboratory studies, ordering and review of radiographic studies, pulse oximetry, re-evaluation of patient's condition and review of old charts     ____________________________________________   INITIAL IMPRESSION / MDM / [place] / ED COURSE  As part of my medical decision making, I reviewed the following data within the electronic MEDICAL RECORD NUMBER   69 year old female presented with above-stated history and physical exam differential diagnosis including pneumonia with failed outpatient management, Covid pneumonia, pulmonary emboli.  Patient met sepsis criteria and as such was given ceftriaxone and azithromycin IV.  CT scan  revealed diffuse pneumonia.  Patient discussed with Dr. Danella Penton for hospital admission for further evaluation and management.      ____________________________________________  FINAL CLINICAL IMPRESSION(S) / ED DIAGNOSES  Final diagnoses:  Pneumonia  SOB (shortness of breath)  COVID-19     MEDICATIONS GIVEN DURING THIS VISIT:  Medications  cefTRIAXone (ROCEPHIN) 2 g in sodium chloride 0.9 % 100 mL IVPB (has no administration in time range)  azithromycin (ZITHROMAX) 500 mg in sodium chloride 0.9 % 250 mL IVPB (has no administration in time range)  sodium chloride 0.9 % bolus 1,000 mL (1,000 mLs Intravenous New Bag/Given 01/09/20 2238)     ED Discharge Orders    None      *Please note:  Jody Brewer was evaluated in Emergency Department on 01/09/2020 for the symptoms described in the history of present illness. She was evaluated in the context of the global COVID-19 pandemic, which necessitated consideration that the patient might be at risk for infection with the SARS-CoV-2 virus that causes COVID-19. Institutional protocols and algorithms that pertain to the evaluation of patients at risk for COVID-19 are in a state of rapid change based on information released by regulatory bodies  including the CDC and federal and state organizations. These policies and algorithms were followed during the patient's care in the ED.  Some ED evaluations and interventions may be delayed as a result of limited staffing during the pandemic.*  Note:  This document was prepared using Dragon voice recognition software and may include unintentional dictation errors.   Gregor Hams, MD 01/10/20 [phone]

## 2020-01-09 NOTE — ED Triage Notes (Addendum)
States she has pneumonia.  Recently switched from Levaquin to Doxycycline.  Reports persistent fevers this week.  Also c/o right pleuritic pain with inspiration.  Also had been diagnosed with COVID December 12/23.

## 2020-01-09 NOTE — ED Notes (Signed)
Pt taken to CT for PE study

## 2020-01-09 NOTE — ED Notes (Signed)
Pt taken to Xray.

## 2020-01-10 ENCOUNTER — Other Ambulatory Visit: Payer: Self-pay

## 2020-01-10 DIAGNOSIS — Z8249 Family history of ischemic heart disease and other diseases of the circulatory system: Secondary | ICD-10-CM | POA: Diagnosis not present

## 2020-01-10 DIAGNOSIS — J9601 Acute respiratory failure with hypoxia: Secondary | ICD-10-CM

## 2020-01-10 DIAGNOSIS — J1282 Pneumonia due to coronavirus disease 2019: Secondary | ICD-10-CM | POA: Diagnosis present

## 2020-01-10 DIAGNOSIS — U071 COVID-19: Secondary | ICD-10-CM | POA: Diagnosis not present

## 2020-01-10 DIAGNOSIS — I10 Essential (primary) hypertension: Secondary | ICD-10-CM

## 2020-01-10 DIAGNOSIS — K219 Gastro-esophageal reflux disease without esophagitis: Secondary | ICD-10-CM | POA: Diagnosis present

## 2020-01-10 DIAGNOSIS — J189 Pneumonia, unspecified organism: Secondary | ICD-10-CM | POA: Diagnosis present

## 2020-01-10 DIAGNOSIS — J45909 Unspecified asthma, uncomplicated: Secondary | ICD-10-CM

## 2020-01-10 DIAGNOSIS — Z79899 Other long term (current) drug therapy: Secondary | ICD-10-CM | POA: Diagnosis not present

## 2020-01-10 DIAGNOSIS — R0602 Shortness of breath: Secondary | ICD-10-CM

## 2020-01-10 LAB — C-REACTIVE PROTEIN: CRP: 0.6 mg/dL (ref <1.0)

## 2020-01-10 LAB — LACTATE DEHYDROGENASE: LDH: 121 U/L (ref 98–192)

## 2020-01-10 LAB — FIBRIN DERIVATIVES D-DIMER (ARMC ONLY): Fibrin derivatives D-dimer (ARMC): 304.61 ng/mL (FEU) (ref 0.00–499.00)

## 2020-01-10 LAB — APTT: aPTT: 29 seconds (ref 24–36)

## 2020-01-10 LAB — PROTIME-INR
INR: 1 (ref 0.8–1.2)
Prothrombin Time: 13.1 seconds (ref 11.4–15.2)

## 2020-01-10 LAB — FERRITIN: Ferritin: 215 ng/mL (ref 11–307)

## 2020-01-10 LAB — PROCALCITONIN: Procalcitonin: <0.1 ng/mL

## 2020-01-10 LAB — ABO/RH: ABO/RH(D): O POS

## 2020-01-10 MED ORDER — ESTRADIOL 1 MG PO TABS: 0.5000 mg | ORAL_TABLET | Freq: Every day | ORAL | Status: DC

## 2020-01-10 MED ORDER — OLMESARTAN MEDOXOMIL-HCTZ 20-12.5 MG PO TABS: 1.0000 | ORAL_TABLET | Freq: Every day | ORAL | Status: DC

## 2020-01-10 MED ORDER — VITAMIN D 25 MCG (1000 UNIT) PO TABS: 1000.0000 [IU] | ORAL_TABLET | Freq: Every day | ORAL | Status: DC

## 2020-01-10 MED ORDER — PANTOPRAZOLE SODIUM 40 MG PO TBEC
40.0000 mg | DELAYED_RELEASE_TABLET | Freq: Two times a day (BID) | ORAL | Status: DC
  Administered 2020-01-10 – 2020-01-11 (×4): 40 mg via ORAL
  Filled 2020-01-10 (×4): qty 1

## 2020-01-10 MED ORDER — ALBUTEROL SULFATE HFA 108 (90 BASE) MCG/ACT IN AERS
1.0000 | INHALATION_SPRAY | Freq: Four times a day (QID) | RESPIRATORY_TRACT | Status: DC | PRN
  Administered 2020-01-10: 1 via RESPIRATORY_TRACT
  Filled 2020-01-10: qty 6.7

## 2020-01-10 MED ORDER — ADULT MULTIVITAMIN W/MINERALS CH
1.0000 | ORAL_TABLET | Freq: Every day | ORAL | Status: DC
  Administered 2020-01-10 – 2020-01-11 (×2): 1 via ORAL
  Filled 2020-01-10 (×2): qty 1

## 2020-01-10 MED ORDER — VITAMIN D 25 MCG (1000 UNIT) PO TABS
1000.0000 [IU] | ORAL_TABLET | Freq: Two times a day (BID) | ORAL | Status: DC
  Administered 2020-01-10 – 2020-01-11 (×4): 1000 [IU] via ORAL
  Filled 2020-01-10 (×4): qty 1

## 2020-01-10 MED ORDER — SODIUM CHLORIDE 0.9 % IV SOLN: 100.0000 mg | Freq: Every day | INTRAVENOUS | Status: DC

## 2020-01-10 MED ORDER — NORTRIPTYLINE HCL 25 MG PO CAPS
25.0000 mg | ORAL_CAPSULE | Freq: Every day | ORAL | Status: DC
  Administered 2020-01-10 (×2): 25 mg via ORAL
  Filled 2020-01-10 (×4): qty 1

## 2020-01-10 MED ORDER — ZINC SULFATE 220 (50 ZN) MG PO CAPS
220.0000 mg | ORAL_CAPSULE | Freq: Every day | ORAL | Status: DC
  Administered 2020-01-10 – 2020-01-11 (×2): 220 mg via ORAL
  Filled 2020-01-10 (×2): qty 1

## 2020-01-10 MED ORDER — MONTELUKAST SODIUM 10 MG PO TABS
10.0000 mg | ORAL_TABLET | Freq: Every day | ORAL | Status: DC
  Administered 2020-01-10 – 2020-01-11 (×2): 10 mg via ORAL
  Filled 2020-01-10 (×2): qty 1

## 2020-01-10 MED ORDER — HYDROCOD POLST-CPM POLST ER 10-8 MG/5ML PO SUER
5.0000 mL | Freq: Two times a day (BID) | ORAL | Status: DC | PRN
  Administered 2020-01-10: 5 mL via ORAL
  Filled 2020-01-10: qty 5

## 2020-01-10 MED ORDER — ONDANSETRON HCL 4 MG/2ML IJ SOLN: 4.0000 mg | Freq: Four times a day (QID) | INTRAMUSCULAR | Status: DC | PRN

## 2020-01-10 MED ORDER — GUAIFENESIN-DM 100-10 MG/5ML PO SYRP
10.0000 mL | ORAL_SOLUTION | ORAL | Status: DC | PRN
  Filled 2020-01-10: qty 10

## 2020-01-10 MED ORDER — ONDANSETRON HCL 4 MG PO TABS: 4.0000 mg | ORAL_TABLET | Freq: Four times a day (QID) | ORAL | Status: DC | PRN

## 2020-01-10 MED ORDER — ASCORBIC ACID 500 MG PO TABS
500.0000 mg | ORAL_TABLET | Freq: Every day | ORAL | Status: DC
  Administered 2020-01-10 – 2020-01-11 (×2): 500 mg via ORAL
  Filled 2020-01-10 (×2): qty 1

## 2020-01-10 MED ORDER — VITAMIN E 180 MG (400 UNIT) PO CAPS
400.0000 [IU] | ORAL_CAPSULE | Freq: Every day | ORAL | Status: DC
  Administered 2020-01-10 – 2020-01-11 (×2): 400 [IU] via ORAL
  Filled 2020-01-10 (×2): qty 1

## 2020-01-10 MED ORDER — ACETAMINOPHEN 325 MG PO TABS
650.0000 mg | ORAL_TABLET | Freq: Four times a day (QID) | ORAL | Status: DC | PRN
  Administered 2020-01-10 – 2020-01-11 (×2): 650 mg via ORAL
  Filled 2020-01-10 (×2): qty 2

## 2020-01-10 MED ORDER — DEXAMETHASONE SODIUM PHOSPHATE 10 MG/ML IJ SOLN
6.0000 mg | INTRAMUSCULAR | Status: DC
  Administered 2020-01-10: 6 mg via INTRAVENOUS
  Filled 2020-01-10: qty 1

## 2020-01-10 MED ORDER — CEFDINIR 300 MG PO CAPS
300.0000 mg | ORAL_CAPSULE | Freq: Two times a day (BID) | ORAL | Status: DC
  Administered 2020-01-10 – 2020-01-11 (×2): 300 mg via ORAL
  Filled 2020-01-10 (×3): qty 1

## 2020-01-10 MED ORDER — HYDROCHLOROTHIAZIDE 12.5 MG PO CAPS
12.5000 mg | ORAL_CAPSULE | Freq: Every day | ORAL | Status: DC
  Administered 2020-01-10 – 2020-01-11 (×2): 12.5 mg via ORAL
  Filled 2020-01-10 (×2): qty 1

## 2020-01-10 MED ORDER — SODIUM CHLORIDE 0.9 % IV SOLN: INTRAVENOUS | Status: DC

## 2020-01-10 MED ORDER — MAGNESIUM HYDROXIDE 400 MG/5ML PO SUSP
30.0000 mL | Freq: Every day | ORAL | Status: DC | PRN
  Administered 2020-01-10: 30 mL via ORAL
  Filled 2020-01-10: qty 30

## 2020-01-10 MED ORDER — IRBESARTAN 150 MG PO TABS
75.0000 mg | ORAL_TABLET | Freq: Every day | ORAL | Status: DC
  Administered 2020-01-10 – 2020-01-11 (×2): 75 mg via ORAL
  Filled 2020-01-10 (×3): qty 1

## 2020-01-10 MED ORDER — SODIUM CHLORIDE 0.9 % IV SOLN: 200.0000 mg | Freq: Once | INTRAVENOUS | Status: DC

## 2020-01-10 MED ORDER — LORATADINE 10 MG PO TABS
10.0000 mg | ORAL_TABLET | Freq: Every day | ORAL | Status: DC
  Administered 2020-01-10 – 2020-01-11 (×2): 10 mg via ORAL
  Filled 2020-01-10 (×2): qty 1

## 2020-01-10 MED ORDER — FAMOTIDINE 20 MG PO TABS: 20.0000 mg | ORAL_TABLET | Freq: Two times a day (BID) | ORAL | Status: DC

## 2020-01-10 MED ORDER — ENOXAPARIN SODIUM 40 MG/0.4ML ~~LOC~~ SOLN
40.0000 mg | SUBCUTANEOUS | Status: DC
  Administered 2020-01-10 – 2020-01-11 (×2): 40 mg via SUBCUTANEOUS
  Filled 2020-01-10 (×2): qty 0.4

## 2020-01-10 MED ORDER — TRAZODONE HCL 50 MG PO TABS: 25.0000 mg | ORAL_TABLET | Freq: Every evening | ORAL | Status: DC | PRN

## 2020-01-10 MED ORDER — ASPIRIN EC 81 MG PO TBEC
81.0000 mg | DELAYED_RELEASE_TABLET | Freq: Every day | ORAL | Status: DC
  Administered 2020-01-11: 81 mg via ORAL
  Filled 2020-01-10 (×2): qty 1

## 2020-01-10 MED ORDER — SODIUM CHLORIDE 0.9 % IV SOLN: 1.0000 g | INTRAVENOUS | Status: DC

## 2020-01-10 MED ORDER — SODIUM CHLORIDE 0.9% FLUSH
10.0000 mL | Freq: Two times a day (BID) | INTRAVENOUS | Status: DC
  Administered 2020-01-10 – 2020-01-11 (×2): 10 mL via INTRAVENOUS

## 2020-01-10 MED ORDER — GUAIFENESIN ER 600 MG PO TB12
600.0000 mg | ORAL_TABLET | Freq: Two times a day (BID) | ORAL | Status: DC
  Administered 2020-01-10 – 2020-01-11 (×4): 600 mg via ORAL
  Filled 2020-01-10 (×4): qty 1

## 2020-01-10 MED ORDER — VENLAFAXINE HCL ER 75 MG PO CP24
75.0000 mg | ORAL_CAPSULE | Freq: Every day | ORAL | Status: DC
  Administered 2020-01-10 – 2020-01-11 (×2): 75 mg via ORAL
  Filled 2020-01-10 (×2): qty 1

## 2020-01-10 MED ORDER — AZITHROMYCIN 250 MG PO TABS: 250.0000 mg | ORAL_TABLET | Freq: Every day | ORAL | Status: DC

## 2020-01-10 NOTE — Progress Notes (Signed)
CODE SEPSIS - PHARMACY COMMUNICATION  **Broad Spectrum Antibiotics should be administered within 1 hour of Sepsis diagnosis**  Time Code Sepsis Called/Page Received: 2332  Antibiotics Ordered: azithro/ceftriaxone  Time of 1st antibiotic administration: 0028  Additional action taken by pharmacy:   If necessary, Name of Provider/Nurse Contacted:     Thomasene Ripple ,PharmD Clinical Pharmacist  01/10/2020  3:40 AM

## 2020-01-10 NOTE — ED Notes (Signed)
Pt assisted to bedside commode. Peri-care performed independently. Pt assisted back to bed. Oxygen remains 100% on room air.

## 2020-01-10 NOTE — ED Notes (Signed)
ED TO INPATIENT HANDOFF REPORT  ED Nurse Name and Phone #: Danae OrleansAnnie   S Name/Age/Gender Jody Brewer 69 y.o. female Room/Bed: ED35A/ED35A  Code Status   Code Status: Full Code  Home/SNF/Other Home Patient oriented to: self, place, time and situation Is this baseline? Yes   Triage Complete: Triage complete  Chief Complaint COVID-19 [U07.1]  Triage Note States she has pneumonia.  Recently switched from Levaquin to Doxycycline.  Reports persistent fevers this week.  Also c/o right pleuritic pain with inspiration.  Also had been diagnosed with COVID December 12/23.    Allergies No Known Allergies  Level of Care/Admitting Diagnosis ED Disposition    ED Disposition Condition Comment   Admit  Hospital Area: [facility] [[id number]]  Level of Care: Med-Surg [16]  Covid Evaluation: Confirmed COVID Positive  Diagnosis: COVID-19 [[id number]][[phone]]  Admitting Physician: Hannah BeatMANSY, JAN A [[id number]]  Attending Physician: Hannah BeatMANSY, JAN A [[id number]]  Estimated length of stay: 3 - 4 days  Certification:: I certify this patient will need inpatient services for at least 2 midnights       B Medical/Surgery History Past Medical History:  Diagnosis Date  . Asthma   . Hypertension    Past Surgical History:  Procedure Laterality Date  . ABDOMINAL HYSTERECTOMY    . APPENDECTOMY    . CHOLECYSTECTOMY    . OOPHORECTOMY     1 ovary removed     A IV Location/Drains/Wounds Patient Lines/Drains/Airways Status   Active Line/Drains/Airways    Name:   Placement date:   Placement time:   Site:   Days:   Peripheral IV 01/09/20 Right Antecubital   01/09/20    2237    Antecubital   1   Peripheral IV 01/10/20 Left Antecubital   01/10/20    0023    Antecubital   less than 1          Intake/Output Last 24 hours  Intake/Output Summary (Last 24 hours) at 01/10/2020 1222 Last data filed at 01/10/2020 0936 Gross per 24 hour  Intake 1700 ml  Output 900 ml  Net 800 ml     Labs/Imaging Results for orders placed or performed during the hospital encounter of 01/09/20 (from the past 48 hour(s))  CBC with Differential     Status: Abnormal   Collection Time: 01/09/20  6:38 PM  Result Value Ref Range   WBC 11.0 (H) 4.0 - 10.5 K/uL   RBC 4.41 3.87 - 5.11 MIL/uL   Hemoglobin 13.4 12.0 - 15.0 g/dL   HCT 29.539.3 28.436.0 - 13.246.0 %   MCV 89.1 80.0 - 100.0 fL   MCH 30.4 26.0 - 34.0 pg   MCHC 34.1 30.0 - 36.0 g/dL   RDW 44.012.5 10.211.5 - 72.515.5 %   Platelets 520 (H) 150 - 400 K/uL   nRBC 0.0 0.0 - 0.2 %   Neutrophils Relative % 84 %   Neutro Abs 9.2 (H) 1.7 - 7.7 K/uL   Lymphocytes Relative 11 %   Lymphs Abs 1.2 0.7 - 4.0 K/uL   Monocytes Relative 4 %   Monocytes Absolute 0.5 0.1 - 1.0 K/uL   Eosinophils Relative 0 %   Eosinophils Absolute 0.0 0.0 - 0.5 K/uL   Basophils Relative 0 %   Basophils Absolute 0.0 0.0 - 0.1 K/uL   Immature Granulocytes 1 %   Abs Immature Granulocytes 0.08 (H) 0.00 - 0.07 K/uL    Comment: Performed at [facility], [address] [place], [place] [address]  Comprehensive  metabolic panel     Status: Abnormal   Collection Time: 01/09/20  6:38 PM  Result Value Ref Range   Sodium 137 135 - 145 mmol/L   Potassium 4.3 3.5 - 5.1 mmol/L   Chloride 104 98 - 111 mmol/L   CO2 21 (L) 22 - 32 mmol/L   Glucose, Bld 108 (H) 70 - 99 mg/dL   BUN 29 (H) 8 - 23 mg/dL   Creatinine, Ser 5.57 0.44 - 1.00 mg/dL   Calcium 9.8 8.9 - 32.2 mg/dL   Total Protein 7.3 6.5 - 8.1 g/dL   Albumin 3.8 3.5 - 5.0 g/dL   AST 30 15 - 41 U/L   ALT 44 0 - 44 U/L   Alkaline Phosphatase 73 38 - 126 U/L   Total Bilirubin 0.9 0.3 - 1.2 mg/dL   GFR calc non Af Amer >60 >60 mL/min   GFR calc Af Amer >60 >60 mL/min   Anion gap 12 5 - 15    Comment: Performed at [facility], [address] [place], [place] [address]  Lactic acid, plasma     Status: None   Collection Time: 01/09/20  6:38 PM  Result Value Ref Range   Lactic Acid, Venous 1.8 0.5 - 1.9 mmol/L     Comment: Performed at [facility], [address], [place], [place] [address]  ABO/Rh     Status: None   Collection Time: 01/09/20  6:38 PM  Result Value Ref Range   ABO/RH(D)      O POS Performed at [facility], [address] [place], [place] [address]   APTT     Status: None   Collection Time: 01/10/20 12:16 AM  Result Value Ref Range   aPTT 29 24 - 36 seconds    Comment: Performed at [facility], [address] [place], [place] [address]  Protime-INR     Status: None   Collection Time: 01/10/20 12:16 AM  Result Value Ref Range   Prothrombin Time 13.1 11.4 - 15.2 seconds   INR 1.0 0.8 - 1.2    Comment: (NOTE) INR goal varies based on device and disease states. Performed at [facility], [address] [place], [place] [address]   Blood Culture (routine x 2)     Status: None (Preliminary result)   Collection Time: 01/10/20 12:16 AM   Specimen: BLOOD  Result Value Ref Range   Specimen Description BLOOD RIGHT ANTECUBITAL    Special Requests      BOTTLES DRAWN AEROBIC AND ANAEROBIC Blood Culture results may not be optimal due to an excessive volume of blood received in culture bottles   Culture      NO GROWTH < 12 HOURS Performed at [facility], [address], [place], [place] [address]    Report Status PENDING   Blood Culture (routine x 2)     Status: None (Preliminary result)   Collection Time: 01/10/20 12:16 AM   Specimen: BLOOD  Result Value Ref Range   Specimen Description BLOOD LEFT ANTECUBITAL    Special Requests      BOTTLES DRAWN AEROBIC AND ANAEROBIC Blood Culture adequate volume   Culture      NO GROWTH < 12 HOURS Performed at [facility], [address], [place], [place] [address]    Report Status PENDING   C-reactive protein     Status: None   Collection Time: 01/10/20  3:29 AM  Result Value Ref Range   CRP  0.6 <1.0 mg/dL    Comment: Performed at [facility], [address], [place], [place] [address]  Ferritin     Status: None   Collection Time: 01/10/20  3:29 AM  Result Value Ref Range   Ferritin 215 11 - 307 ng/mL    Comment: Performed at [facility], [address] [place], [place] [address]  Lactate dehydrogenase     Status: None   Collection Time: 01/10/20  3:29 AM  Result Value Ref Range   LDH 121 98 - 192 U/L    Comment: Performed at [facility], [address] [place], [place] [address]  Procalcitonin - Baseline     Status: None   Collection Time: 01/10/20  3:29 AM  Result Value Ref Range   Procalcitonin <0.10 ng/mL    Comment:        Interpretation: PCT (Procalcitonin) <= 0.5 ng/mL: Systemic infection (sepsis) is not likely. Local bacterial infection is possible. (NOTE)       Sepsis PCT Algorithm           Lower Respiratory Tract                                      Infection PCT Algorithm    ----------------------------     ----------------------------         PCT < 0.25 ng/mL                PCT < 0.10 ng/mL         Strongly encourage             Strongly discourage   discontinuation of antibiotics    initiation of antibiotics    ----------------------------     -----------------------------       PCT 0.25 - 0.50 ng/mL            PCT 0.10 - 0.25 ng/mL               OR       >80% decrease in PCT            Discourage initiation of                                            antibiotics      Encourage discontinuation           of antibiotics    ----------------------------     -----------------------------         PCT >= 0.50 ng/mL              PCT 0.26 - 0.50 ng/mL               AND        <80% decrease in PCT             Encourage initiation of                                             antibiotics       Encourage continuation           of antibiotics    ----------------------------     -----------------------------  PCT >= 0.50 ng/mL                  PCT > 0.50 ng/mL               AND         increase  in PCT                  Strongly encourage                                      initiation of antibiotics    Strongly encourage escalation           of antibiotics                                     -----------------------------                                           PCT <= 0.25 ng/mL                                                 OR                                        > 80% decrease in PCT                                     Discontinue / Do not initiate                                             antibiotics Performed at [facility], [address], [place], [place] [address]   Fibrin derivatives D-Dimer [facility] only)     Status: None   Collection Time: 01/10/20  9:22 AM  Result Value Ref Range   Fibrin derivatives D-dimer ([facility]) 304.61 0.00 - 499.00 ng/mL (FEU)    Comment: (NOTE) <> Exclusion of Venous Thromboembolism (VTE) - OUTPATIENT ONLY   (Emergency Department or [place])   0-499 ng/ml (FEU): With a low to intermediate pretest probability                      for VTE this test result excludes the diagnosis                      of VTE.   >499 ng/ml (FEU) : VTE not excluded; additional work up for VTE is                      required. <> Testing on Inpatients and Evaluation of Disseminated Intravascular   Coagulation (DIC) Reference Range:   0-499 ng/ml (FEU) Performed at [facility], [address], [place], [place] [address]    CT Angio Chest  PE W and/or Wo Contrast  Result Date: 01/10/2020 CLINICAL DATA:  69 year old female with pneumonia, switched antibiotics recently. Persistent fever. Right side pleuritic pain. Positive for COVID-19 last month. EXAM: CT ANGIOGRAPHY CHEST WITH CONTRAST TECHNIQUE: Multidetector CT imaging of the chest was performed using the standard protocol during bolus administration of intravenous contrast. Multiplanar CT image reconstructions and MIPs were obtained to evaluate the vascular anatomy. CONTRAST:  100mL OMNIPAQUE  IOHEXOL 350 MG/ML SOLN COMPARISON:  Portable chest earlier today, 01/05/2020. CTA chest 11/08/2018. FINDINGS: Cardiovascular: Good contrast bolus timing in the pulmonary arterial tree. No focal filling defect identified in the pulmonary arteries to suggest acute pulmonary embolism. No calcified coronary artery atherosclerosis is evident. No cardiomegaly or pericardial effusion. Negative visible aorta. Mediastinum/Nodes: Negative. No lymphadenopathy. Lungs/Pleura: Major airways remain patent. There is widespread scattered right lung peripheral and peribronchial ground-glass opacity in all lobes. In the contralateral left lung there is only occasional mild peripheral and peribronchial ground-glass (series 7, image 51). No consolidation. No pleural effusion. Upper Abdomen: Surgically absent gallbladder. Negative visible liver, spleen, pancreas, adrenal glands, kidneys. Small gastric hiatal hernia otherwise negative visible bowel. Musculoskeletal: Posterior fusion hardware beginning in the lower thoracic spine and continuing inferiorly. Solid-appearing posterior element arthrodesis and superimposed additional levels of lower thoracic interbody ankylosis related to bridging endplate osteophytes. No acute osseous abnormality identified. Review of the MIP images confirms the above findings. IMPRESSION: 1. Negative for acute pulmonary embolus. 2. Widespread right lung and occasional left lung peripheral and peribronchial ground-glass opacity typical in appearance for COVID-19 pneumonia. Other viral/atypical etiology is possible. No pleural effusion. 3. No other acute findings in the chest. Electronically Signed   By: Odessa FlemingH  Hall M.D.   On: 01/10/2020 00:10   DG Chest Port 1 View  Result Date: 01/09/2020 CLINICAL DATA:  69 year old female with pneumonia, switched antibiotics recently. Persistent fever. Right side pleuritic pain. Positive for COVID-19. Last month. EXAM: PORTABLE CHEST 1 VIEW COMPARISON:  Portable chest  01/05/2020 and earlier. FINDINGS: Portable AP upright view at 2301 hours. Continued patchy and streaky opacity in the right lower lung. Density has mildly increased since 01/05/2020. No superimposed pneumothorax or pleural effusion. Elsewhere lung markings are stable. Normal lung volumes and mediastinal contours. Partially visible thoracolumbar posterior fusion hardware. IMPRESSION: Increased right lower lung pneumonia since 01/05/2020. No pleural effusion. Electronically Signed   By: Odessa FlemingH  Hall M.D.   On: 01/09/2020 23:25    Pending Labs Unresulted Labs (From admission, onward)    Start     Ordered   01/11/20 0500  Comprehensive metabolic panel  Daily,   STAT     01/10/20 0116   01/11/20 0500  CBC with Differential/Platelet  Daily,   STAT     01/10/20 0116   01/11/20 0500  C-reactive protein  Daily,   STAT     01/10/20 0116   01/11/20 0500  Fibrin derivatives D-Dimer ([facility] only)  Daily,   STAT     01/10/20 0116   01/11/20 0500  Ferritin  Daily,   STAT     01/10/20 0116   01/10/20 0113  Influenza panel by PCR (type A & B)  Add-on,   AD     01/10/20 0116          Vitals/Pain Today's Vitals   01/10/20 0329 01/10/20 0330 01/10/20 0500 01/10/20 0800  BP:  139/71 134/64 (!) 152/70  Pulse:  83 82 87  Resp:  18 17 15   Temp:      TempSrc:  SpO2:  100% 100% 99%  Weight:      Height:      PainSc: 0-No pain       Isolation Precautions Airborne and Contact precautions  Medications Medications  cefTRIAXone (ROCEPHIN) 2 g in sodium chloride 0.9 % 100 mL IVPB (0 g Intravenous Stopped 01/10/20 0105)  azithromycin (ZITHROMAX) 500 mg in sodium chloride 0.9 % 250 mL IVPB (0 mg Intravenous Stopped 01/10/20 0126)  remdesivir 200 mg in sodium chloride 0.9% 250 mL IVPB (0 mg Intravenous Stopped 01/10/20 0221)    Followed by  remdesivir 100 mg in sodium chloride 0.9 % 100 mL IVPB (0 mg Intravenous Stopped 01/10/20 0936)  nortriptyline (PAMELOR) capsule 25 mg (25 mg Oral Given 01/10/20 0332)   venlafaxine XR (EFFEXOR-XR) 24 hr capsule 75 mg (75 mg Oral Given 01/10/20 0906)  pantoprazole (PROTONIX) EC tablet 40 mg (40 mg Oral Given 01/10/20 0906)  cholecalciferol (VITAMIN D3) tablet 1,000 Units (1,000 Units Oral Given 01/10/20 0906)  multivitamin with minerals tablet 1 tablet (1 tablet Oral Given 01/10/20 0906)  vitamin E capsule 400 Units (400 Units Oral Given 01/10/20 0907)  albuterol (VENTOLIN HFA) 108 (90 Base) MCG/ACT inhaler 1 puff (1 puff Inhalation Given 01/10/20 0316)  loratadine (CLARITIN) tablet 10 mg (10 mg Oral Given 01/10/20 0906)  montelukast (SINGULAIR) tablet 10 mg (10 mg Oral Given 01/10/20 0907)  enoxaparin (LOVENOX) injection 40 mg (40 mg Subcutaneous Given 01/10/20 0317)  guaiFENesin-dextromethorphan (ROBITUSSIN DM) 100-10 MG/5ML syrup 10 mL (has no administration in time range)  chlorpheniramine-HYDROcodone (TUSSIONEX) 10-8 MG/5ML suspension 5 mL (has no administration in time range)  dexamethasone (DECADRON) injection 6 mg (6 mg Intravenous Given 01/10/20 0318)  ascorbic acid (VITAMIN C) tablet 500 mg (500 mg Oral Given 01/10/20 0907)  zinc sulfate capsule 220 mg (220 mg Oral Given 01/10/20 0906)  acetaminophen (TYLENOL) tablet 650 mg (has no administration in time range)  traZODone (DESYREL) tablet 25 mg (has no administration in time range)  magnesium hydroxide (MILK OF MAGNESIA) suspension 30 mL (has no administration in time range)  ondansetron (ZOFRAN) tablet 4 mg (has no administration in time range)    Or  ondansetron (ZOFRAN) injection 4 mg (has no administration in time range)  aspirin EC tablet 81 mg (81 mg Oral Refused 01/10/20 0906)  guaiFENesin (MUCINEX) 12 hr tablet 600 mg (600 mg Oral Given 01/10/20 0906)  irbesartan (AVAPRO) tablet 75 mg (75 mg Oral Given 01/10/20 0906)    And  hydrochlorothiazide (MICROZIDE) capsule 12.5 mg (12.5 mg Oral Given 01/10/20 0906)  sodium chloride 0.9 % bolus 1,000 mL (0 mLs Intravenous Stopped 01/10/20 0030)  dexamethasone  (DECADRON) injection 10 mg (10 mg Intravenous Given 01/09/20 2342)  iohexol (OMNIPAQUE) 350 MG/ML injection 100 mL (100 mLs Intravenous Contrast Given 01/09/20 2346)    Mobility walks Low fall risk   Focused Assessments Pulmonary Assessment Handoff:  Lung sounds: Bilateral Breath Sounds: Expiratory wheezes L Breath Sounds: Diminished R Breath Sounds: Clear O2 Device: Room Air O2 Flow Rate (L/min): 2 L/min      R Recommendations: See Admitting Provider Note  Report given to:   Additional Notes:

## 2020-01-10 NOTE — Progress Notes (Signed)
[facility]  - [place] [facility]   PATIENT NAME: Jody Brewer    MR#:  [id number]  DATE OF BIRTH:  [birth year]  SUBJECTIVE:   Patient came in with intermittent high-grade fever 103 at home. She denies coughing up any flame however feels tired fatigued and short of breath. Feels a lot better after receiving IV antibiotics fluids and steroids. REVIEW OF SYSTEMS:   Review of Systems  Constitutional: Positive for malaise/fatigue. Negative for chills, fever and weight loss.  HENT: Negative for ear discharge, ear pain and nosebleeds.   Eyes: Negative for blurred vision, pain and discharge.  Respiratory: Positive for shortness of breath. Negative for sputum production, wheezing and stridor.   Cardiovascular: Negative for chest pain, palpitations, orthopnea and PND.  Gastrointestinal: Negative for abdominal pain, diarrhea, nausea and vomiting.  Genitourinary: Negative for frequency and urgency.  Musculoskeletal: Negative for back pain and joint pain.  Neurological: Positive for weakness. Negative for sensory change, speech change and focal weakness.  Psychiatric/Behavioral: Negative for depression and hallucinations. The patient is not nervous/anxious.    Tolerating Diet:yes Tolerating PT: not needed  DRUG ALLERGIES:  No Known Allergies  VITALS:  Blood pressure 123/65, pulse 84, temperature 97.8 F (36.6 C), temperature source Oral, resp. rate 16, height 5\' 8"  (1.727 m), weight 83.1 kg, SpO2 96 %.  PHYSICAL EXAMINATION:   Physical Exam  GENERAL:  69 y.o.-year-old patient lying in the bed with no acute distress.  EYES: Pupils equal, round, reactive to light and accommodation. No scleral icterus. Extraocular muscles intact.  HEENT: Head atraumatic, normocephalic. Oropharynx and nasopharynx clear.  NECK:  Supple, no jugular venous distention. No thyroid enlargement, no tenderness.  LUNGS: decreased breath sounds bilaterally bases , no wheezing, rales, rhonchi.  No use of accessory muscles of respiration.  CARDIOVASCULAR: S1, S2 normal. No murmurs, rubs, or gallops.  ABDOMEN: Soft, nontender, nondistended. Bowel sounds present. No organomegaly or mass.  EXTREMITIES: No cyanosis, clubbing or edema b/l.    NEUROLOGIC: Cranial nerves II through XII are intact. No focal Motor or sensory deficits b/l.   PSYCHIATRIC:  patient is alert and oriented x 3.  SKIN: No obvious rash, lesion, or ulcer.   LABORATORY PANEL:  CBC Recent Labs  Lab 01/09/20 1838  WBC 11.0*  HGB 13.4  HCT 39.3  PLT 520*    Chemistries  Recent Labs  Lab 01/09/20 1838  NA 137  K 4.3  CL 104  CO2 21*  GLUCOSE 108*  BUN 29*  CREATININE 0.95  CALCIUM 9.8  AST 30  ALT 44  ALKPHOS 73  BILITOT 0.9   Cardiac Enzymes No results for input(s): TROPONINI in the last 168 hours. RADIOLOGY:  CT Angio Chest PE W and/or Wo Contrast  Result Date: 01/10/2020 CLINICAL DATA:  69 year old female with pneumonia, switched antibiotics recently. Persistent fever. Right side pleuritic pain. Positive for COVID-19 last month. EXAM: CT ANGIOGRAPHY CHEST WITH CONTRAST TECHNIQUE: Multidetector CT imaging of the chest was performed using the standard protocol during bolus administration of intravenous contrast. Multiplanar CT image reconstructions and MIPs were obtained to evaluate the vascular anatomy. CONTRAST:  79 OMNIPAQUE IOHEXOL 350 MG/ML SOLN COMPARISON:  Portable chest earlier today, 01/05/2020. CTA chest 11/08/2018. FINDINGS: Cardiovascular: Good contrast bolus timing in the pulmonary arterial tree. No focal filling defect identified in the pulmonary arteries to suggest acute pulmonary embolism. No calcified coronary artery atherosclerosis is evident. No cardiomegaly or pericardial effusion. Negative visible aorta. Mediastinum/Nodes: Negative. No lymphadenopathy. Lungs/Pleura: Major airways remain patent.  There is widespread scattered right lung peripheral and peribronchial ground-glass  opacity in all lobes. In the contralateral left lung there is only occasional mild peripheral and peribronchial ground-glass (series 7, image 51). No consolidation. No pleural effusion. Upper Abdomen: Surgically absent gallbladder. Negative visible liver, spleen, pancreas, adrenal glands, kidneys. Small gastric hiatal hernia otherwise negative visible bowel. Musculoskeletal: Posterior fusion hardware beginning in the lower thoracic spine and continuing inferiorly. Solid-appearing posterior element arthrodesis and superimposed additional levels of lower thoracic interbody ankylosis related to bridging endplate osteophytes. No acute osseous abnormality identified. Review of the MIP images confirms the above findings. IMPRESSION: 1. Negative for acute pulmonary embolus. 2. Widespread right lung and occasional left lung peripheral and peribronchial ground-glass opacity typical in appearance for COVID-19 pneumonia. Other viral/atypical etiology is possible. No pleural effusion. 3. No other acute findings in the chest. Electronically Signed   By: Odessa Fleming M.D.   On: 01/10/2020 00:10   DG Chest Port 1 View  Result Date: 01/09/2020 CLINICAL DATA:  69 year old female with pneumonia, switched antibiotics recently. Persistent fever. Right side pleuritic pain. Positive for COVID-19. Last month. EXAM: PORTABLE CHEST 1 VIEW COMPARISON:  Portable chest 01/05/2020 and earlier. FINDINGS: Portable AP upright view at 2301 hours. Continued patchy and streaky opacity in the right lower lung. Density has mildly increased since 01/05/2020. No superimposed pneumothorax or pleural effusion. Elsewhere lung markings are stable. Normal lung volumes and mediastinal contours. Partially visible thoracolumbar posterior fusion hardware. IMPRESSION: Increased right lower lung pneumonia since 01/05/2020. No pleural effusion. Electronically Signed   By: Odessa Fleming M.D.   On: 01/09/2020 23:25   ASSESSMENT AND PLAN:  Ziyanna Tolin  is a 69 y.o.  Caucasian female with a known history of asthma and hypertension, who presented to the emergency room acute onset of persistent dry cough with associated dyspnea without wheezing for the last couple weeks.  She had a positive COVID-19 test on 12/31.  She admitted to fever which has been intermittent and up to 103 over the last couple weeks.   1. Acute hypoxemic respiratory failure secondary to COVID-19. -O2 protocol will be followed to keep O2 saturation above 93.  2.   Pneumonia likely secondary to COVID-19.s. -Given multifocal pneumonia we will empirically place the patient on IV Rocephin and Zithromax --change to po antibiotic -Her procalcitonin is <0.1--d/w pt. Given that she is been having fevers intermittently at home I will finish course for seven days  -Ton scheduled Mucinex and as needed Tussionex. -O2 protocol will be followed. - patient on IV Remdisivir and IV steroid therapy with Decadron with elevated inflammatory markers. - on vitamin D3, vitamin C, zinc sulfate, p.o. Pepcid  -patient feels a lot better.  3.  Asthma.  She has no current exacerbation.  We will continue Singulair and as needed albuterol MDI.  4.  Essential hypertension.  We will continue Benicar while hydrating the patient given slightly elevated BUN.  5.  GERD.  PPI therapy will be resumed.  6.  DVT prophylaxis.  Subtenons Lovenox.  Procedures:none Family communication : patient and husband on the phone Consults : none Discharge Disposition : home CODE STATUS: full DVT Prophylaxis : lovenox  TOTAL TIME TAKING CARE OF THIS PATIENT: *30* minutes.  >50% time spent on counselling and coordination of care  Note: This dictation was prepared with Dragon dictation along with smaller phrase technology. Any transcriptional errors that result from this process are unintentional.  Enedina Finner M.D on 01/10/2020 at 3:56 PM  Between 7am  to 6pm - Pager - [phone]  After 6pm go to [email or web address]  [facility]   CC: Primary care physician; Rusty Aus, MDPatient ID: Omar Person, female   DOB: October 15, 1951, 69 y.o.   MRN: [id number]

## 2020-01-10 NOTE — ED Notes (Addendum)
Patient ambulated in room approx 50 feet. Patient became more short of breath and became fatigued, but was able to tolerate ambulation.

## 2020-01-10 NOTE — H&P (Addendum)
[name]    MR#:  [id number]  DATE OF BIRTH:  05-Mar-1951  DATE OF ADMISSION:  01/09/2020  PRIMARY CARE PHYSICIAN: Jody Aus, MD   REQUESTING/REFERRING PHYSICIAN: Marjean Donna, MD  CHIEF COMPLAINT:   Chief Complaint  Patient presents with  . Fever    HISTORY OF PRESENT ILLNESS:  Jody Brewer  is a 69 y.o. Caucasian female with a known history of asthma and hypertension, who presented to the emergency room acute onset of persistent dry cough with associated dyspnea without wheezing for the last couple weeks.  She had a positive COVID-19 test on 12/31.  She admitted to fever which has been intermittent and up to 103 over the last couple weeks.  She has been alternating Tylenol and ibuprofen almost every 3 hours.  She denies any diarrhea however admits to nausea without vomiting.  She admits to loss of taste and smell which has resolved.  She has been having headache without dizziness or blurred vision.  No dysuria, oliguria or hematuria or flank pain.  Upon presentation to the emergency room, temperature was 99 otherwise vital signs were within normal.  Later on the patient was however required O2 at 2 L/min and pulse oximetry was 100%.  Respiratory rate was 22.  Labs revealed a BUN of 29 with otherwise unremarkable CMP.  CBC showed minimal leukocytosis and thrombocytosis.  Serum ferritin came back to 15 and LDH 121.  Blood cultures were drawn.EKG showed normal sinus rhythm with a rate of 89 with Q waves inferiorly.  Chest x-ray showed increased right lower lobe pneumonia compared to 01/05/2020 with no pleural effusion.  The patient was given IV Rocephin and Zithromax as well as IV remdesivir 1 L bolus of IV normal saline and 10 mg of IV Decadron.  She will be admitted to the medical monitored isolation bed for further evaluation and management.  PAST MEDICAL HISTORY:   Past Medical History:  Diagnosis Date  . Asthma     . Hypertension     PAST SURGICAL HISTORY:   Past Surgical History:  Procedure Laterality Date  . ABDOMINAL HYSTERECTOMY    . APPENDECTOMY    . CHOLECYSTECTOMY    . OOPHORECTOMY     1 ovary removed    SOCIAL HISTORY:   Social History   Tobacco Use  . Smoking status: Never Smoker  . Smokeless tobacco: Never Used  Substance Use Topics  . Alcohol use: Not Currently    FAMILY HISTORY:   Family History  Problem Relation Age of Onset  . Emphysema Mother   . Parkinson's disease Father   . Dementia Father   . Hypertension Father     DRUG ALLERGIES:  No Known Allergies  REVIEW OF SYSTEMS:   ROS As per history of present illness. All pertinent systems were reviewed above. Constitutional,  HEENT, cardiovascular, respiratory, GI, GU, musculoskeletal, neuro, psychiatric, endocrine,  integumentary and hematologic systems were reviewed and are otherwise  negative/unremarkable except for positive findings mentioned above in the HPI.   MEDICATIONS AT HOME:   Prior to Admission medications   Medication Sig Start Date End Date Taking? Authorizing Provider  albuterol (VENTOLIN HFA) 108 (90 Base) MCG/ACT inhaler Inhale 1 puff into the lungs every 6 (six) hours as needed for wheezing or shortness of breath.   Yes [provider]  azithromycin (ZITHROMAX) 500 MG tablet Take 500 mg by mouth daily. 01/08/20 01/12/20 Yes [provider]  cetirizine (ZYRTEC) 10 MG tablet Take 10 mg by mouth daily.   Yes [provider]  cholecalciferol (VITAMIN D3) 25 MCG (1000 UT) tablet Take 1,000 Units by mouth 2 (two) times daily.   Yes [provider]  doxycycline (VIBRAMYCIN) 100 MG capsule Take 100 mg by mouth 2 (two) times daily. 01/07/20 01/14/20 Yes [provider]  estradiol (ESTRACE) 0.5 MG tablet Take 0.5 mg by mouth daily. 10/15/19  Yes [provider]  montelukast (SINGULAIR) 10 MG tablet Take 10 mg by mouth daily. 10/15/19  Yes [provider]  Multiple Vitamin (MULTI-VITAMIN) tablet Take 1 tablet by mouth daily.   Yes [provider]  nortriptyline (PAMELOR) 25 MG capsule Take 25 mg by mouth at bedtime. 11/26/19  Yes [provider]  olmesartan-hydrochlorothiazide (BENICAR HCT) 20-12.5 MG tablet Take 1 tablet by mouth daily. 11/17/19  Yes [provider]  pantoprazole (PROTONIX) 40 MG tablet Take 40 mg by mouth 2 (two) times daily. 10/15/19  Yes [provider]  venlafaxine XR (EFFEXOR-XR) 75 MG 24 hr capsule Take 75 mg by mouth daily. 10/15/19  Yes [provider]  vitamin E 400 UNIT capsule Take 400 Units by mouth daily.   Yes [provider]      VITAL SIGNS:  Blood pressure 134/64, pulse 82, temperature 98.6 F (37 C), temperature source Oral, resp. rate 17, height 5\' 8"  (1.727 m), weight 81.6 kg, SpO2 100 %.  PHYSICAL EXAMINATION:  Physical Exam  GENERAL:  69 y.o.-year-old Caucasian female patient lying in the bed with mild respiratory distress with conversational dyspnea. EYES: Pupils equal, round, reactive to light and accommodation. No scleral icterus. Extraocular muscles intact.  HEENT: Head atraumatic, normocephalic. Oropharynx and nasopharynx clear.  NECK:  Supple, no jugular venous distention. No thyroid enlargement, no tenderness.  LUNGS: Diminished bibasal breath sounds with right basal crackles. CARDIOVASCULAR: Regular rate and rhythm, S1, S2 normal. No murmurs, rubs, or gallops.  ABDOMEN: Soft, nondistended, nontender. Bowel sounds present. No organomegaly or mass.  EXTREMITIES: No pedal edema, cyanosis, or clubbing.  NEUROLOGIC: Cranial nerves II through XII are intact. Muscle strength 5/5 in all extremities. Sensation intact. Gait not checked.  PSYCHIATRIC: The patient is alert and oriented x 3.  Normal affect and good eye contact. SKIN: No obvious rash, lesion, or ulcer.   LABORATORY PANEL:   CBC Recent Labs  Lab 01/09/20 1838  WBC  11.0*  HGB 13.4  HCT 39.3  PLT 520*   ------------------------------------------------------------------------------------------------------------------  Chemistries  Recent Labs  Lab 01/09/20 1838  NA 137  K 4.3  CL 104  CO2 21*  GLUCOSE 108*  BUN 29*  CREATININE 0.95  CALCIUM 9.8  AST 30  ALT 44  ALKPHOS 73  BILITOT 0.9   ------------------------------------------------------------------------------------------------------------------  Cardiac Enzymes No results for input(s): TROPONINI in the last 168 hours. ------------------------------------------------------------------------------------------------------------------  RADIOLOGY:  CT Angio Chest PE W and/or Wo Contrast  Result Date: 01/10/2020 CLINICAL DATA:  69 year old female with pneumonia, switched antibiotics recently. Persistent fever. Right side pleuritic pain. Positive for COVID-19 last month. EXAM: CT ANGIOGRAPHY CHEST WITH CONTRAST TECHNIQUE: Multidetector CT imaging of the chest was performed using the standard protocol during bolus administration of intravenous contrast. Multiplanar CT image reconstructions and MIPs were obtained to evaluate the vascular anatomy. CONTRAST:  79 OMNIPAQUE IOHEXOL 350 MG/ML SOLN COMPARISON:  Portable chest earlier today, 01/05/2020. CTA chest 11/08/2018. FINDINGS: Cardiovascular: Good contrast bolus timing in the pulmonary arterial tree. No focal filling defect identified in the pulmonary  arteries to suggest acute pulmonary embolism. No calcified coronary artery atherosclerosis is evident. No cardiomegaly or pericardial effusion. Negative visible aorta. Mediastinum/Nodes: Negative. No lymphadenopathy. Lungs/Pleura: Major airways remain patent. There is widespread scattered right lung peripheral and peribronchial ground-glass opacity in all lobes. In the contralateral left lung there is only occasional mild peripheral and peribronchial ground-glass (series 7, image 51). No  consolidation. No pleural effusion. Upper Abdomen: Surgically absent gallbladder. Negative visible liver, spleen, pancreas, adrenal glands, kidneys. Small gastric hiatal hernia otherwise negative visible bowel. Musculoskeletal: Posterior fusion hardware beginning in the lower thoracic spine and continuing inferiorly. Solid-appearing posterior element arthrodesis and superimposed additional levels of lower thoracic interbody ankylosis related to bridging endplate osteophytes. No acute osseous abnormality identified. Review of the MIP images confirms the above findings. IMPRESSION: 1. Negative for acute pulmonary embolus. 2. Widespread right lung and occasional left lung peripheral and peribronchial ground-glass opacity typical in appearance for COVID-19 pneumonia. Other viral/atypical etiology is possible. No pleural effusion. 3. No other acute findings in the chest. Electronically Signed   By: Odessa Fleming M.D.   On: 01/10/2020 00:10   DG Chest Port 1 View  Result Date: 01/09/2020 CLINICAL DATA:  69 year old female with pneumonia, switched antibiotics recently. Persistent fever. Right side pleuritic pain. Positive for COVID-19. Last month. EXAM: PORTABLE CHEST 1 VIEW COMPARISON:  Portable chest 01/05/2020 and earlier. FINDINGS: Portable AP upright view at 2301 hours. Continued patchy and streaky opacity in the right lower lung. Density has mildly increased since 01/05/2020. No superimposed pneumothorax or pleural effusion. Elsewhere lung markings are stable. Normal lung volumes and mediastinal contours. Partially visible thoracolumbar posterior fusion hardware. IMPRESSION: Increased right lower lung pneumonia since 01/05/2020. No pleural effusion. Electronically Signed   By: Odessa Fleming M.D.   On: 01/09/2020 23:25      IMPRESSION AND PLAN:   1.  Acute hypoxemic respiratory failure secondary to COVID-19. -The patient will be admitted to a medically monitored isolation bed. -O2 protocol will be followed to keep O2  saturation above 93.   2.    Pneumonia likely secondary to COVID-19. -The patient will be admitted to an isolation monitored bed with droplet and contact precautions. -Given multifocal pneumonia we will empirically place the patient on IV Rocephin and Zithromax for possible bacterial superinfection only with elevated Procalcitonin.  She received 1 dose in the ER and will follow procalcitonin. -The patient will be placed on scheduled Mucinex and as needed Tussionex. -We will avoid nebulization as much as we can, give bronchodilator MDI if needed, and with deterioration of oxygenation try to avoid BiPAP/CPAP if possible.    -Will obtain sputum Gram stain culture and sensitivity and follow blood cultures. -O2 protocol will be followed. -We will follow CRP, ferritin, LDH and D-dimer. -Will follow manual differential for ANC/ALC ratio as well as follow troponin I and daily CBC with manual differential and CMP. - Will place the patient on IV Remdisivir and IV steroid therapy with Decadron with elevated inflammatory markers. - I discussed convalescent plasma benefits and risks with the patient who was agreeable to proceed with it. -The patient will be placed on vitamin D3, vitamin C, zinc sulfate, p.o. Pepcid and aspirin.  3.  Asthma.  She has no current exacerbation.  We will continue Singulair and as needed albuterol MDI.  4.  Essential hypertension.  We will continue Benicar while hydrating the patient given slightly elevated BUN.  5.  GERD.  PPI therapy will be resumed.  6.  DVT prophylaxis.  Subtenons Lovenox.    All the records are reviewed and case discussed with ED provider. The plan of care was discussed in details with the patient (and family). I answered all questions. The patient agreed to proceed with the above mentioned plan. Further management will depend upon hospital course.   CODE STATUS: Full code  TOTAL TIME TAKING CARE OF THIS PATIENT: 55 minutes.    Hannah Beat M.D  on 01/10/2020 at 6:14 AM  [facility]   From 7 PM-7 AM, contact night-coverage [email or web address]  CC: Primary care physician; Danella Penton, MD   Note: This dictation was prepared with Dragon dictation along with smaller phrase technology. Any transcriptional errors that result from this process are unintentional.

## 2020-01-10 NOTE — Progress Notes (Signed)
Patient admitted to unit. Oriented to room, call bell, and staff. Bed in lowest position. Fall safety plan reviewed. Full assessment to Epic. Skin assessment verified with Margaretmary Dys RN. Telemetry box verification with tele clerk- Box#: [id number]. Will continue to monitor.

## 2020-01-11 LAB — COMPREHENSIVE METABOLIC PANEL
ALT: 37 U/L (ref 0–44)
AST: 24 U/L (ref 15–41)
Albumin: 3.2 g/dL — ABNORMAL LOW (ref 3.5–5.0)
Alkaline Phosphatase: 64 U/L (ref 38–126)
Anion gap: 7 (ref 5–15)
BUN: 30 mg/dL — ABNORMAL HIGH (ref 8–23)
CO2: 25 mmol/L (ref 22–32)
Calcium: 8.8 mg/dL — ABNORMAL LOW (ref 8.9–10.3)
Chloride: 107 mmol/L (ref 98–111)
Creatinine, Ser: 0.81 mg/dL (ref 0.44–1.00)
GFR calc Af Amer: >60 mL/min (ref >60)
GFR calc non Af Amer: >60 mL/min (ref >60)
Glucose, Bld: 96 mg/dL (ref 70–99)
Potassium: 4.1 mmol/L (ref 3.5–5.1)
Sodium: 139 mmol/L (ref 135–145)
Total Bilirubin: 1 mg/dL (ref 0.3–1.2)
Total Protein: 6.3 g/dL — ABNORMAL LOW (ref 6.5–8.1)

## 2020-01-11 LAB — FIBRIN DERIVATIVES D-DIMER (ARMC ONLY): Fibrin derivatives D-dimer (ARMC): 308.41 ng/mL (FEU) (ref 0.00–499.00)

## 2020-01-11 LAB — CBC WITH DIFFERENTIAL/PLATELET
Abs Immature Granulocytes: 0.05 10*3/uL (ref 0.00–0.07)
Basophils Absolute: 0 10*3/uL (ref 0.0–0.1)
Basophils Relative: 0 %
Eosinophils Absolute: 0 10*3/uL (ref 0.0–0.5)
Eosinophils Relative: 0 %
HCT: 32.9 % — ABNORMAL LOW (ref 36.0–46.0)
Hemoglobin: 11 g/dL — ABNORMAL LOW (ref 12.0–15.0)
Immature Granulocytes: 1 %
Lymphocytes Relative: 20 %
Lymphs Abs: 2.1 10*3/uL (ref 0.7–4.0)
MCH: 30 pg (ref 26.0–34.0)
MCHC: 33.4 g/dL (ref 30.0–36.0)
MCV: 89.6 fL (ref 80.0–100.0)
Monocytes Absolute: 1 10*3/uL (ref 0.1–1.0)
Monocytes Relative: 9 %
Neutro Abs: 7.5 10*3/uL (ref 1.7–7.7)
Neutrophils Relative %: 70 %
Platelets: 407 10*3/uL — ABNORMAL HIGH (ref 150–400)
RBC: 3.67 MIL/uL — ABNORMAL LOW (ref 3.87–5.11)
RDW: 12.8 % (ref 11.5–15.5)
WBC: 10.6 10*3/uL — ABNORMAL HIGH (ref 4.0–10.5)
nRBC: 0 % (ref 0.0–0.2)

## 2020-01-11 LAB — C-REACTIVE PROTEIN: CRP: <0.5 mg/dL (ref <1.0)

## 2020-01-11 LAB — FERRITIN: Ferritin: 192 ng/mL (ref 11–307)

## 2020-01-11 MED ORDER — GUAIFENESIN-DM 100-10 MG/5ML PO SYRP: 10.0000 mL | ORAL_SOLUTION | ORAL | 0 refills | Status: DC | PRN

## 2020-01-11 MED ORDER — HYDROCOD POLST-CPM POLST ER 10-8 MG/5ML PO SUER: 5.0000 mL | Freq: Two times a day (BID) | ORAL | 0 refills | Status: DC | PRN

## 2020-01-11 MED ORDER — DEXAMETHASONE 4 MG PO TABS
6.0000 mg | ORAL_TABLET | Freq: Every day | ORAL | Status: DC
  Administered 2020-01-11: 6 mg via ORAL
  Filled 2020-01-11: qty 1.5

## 2020-01-11 MED ORDER — DEXAMETHASONE 6 MG PO TABS: 6.0000 mg | ORAL_TABLET | Freq: Every day | ORAL | 0 refills | Status: DC

## 2020-01-11 MED ORDER — CEFDINIR 300 MG PO CAPS: 300.0000 mg | ORAL_CAPSULE | Freq: Two times a day (BID) | ORAL | 0 refills | Status: AC

## 2020-01-11 NOTE — Discharge Instructions (Signed)
You are scheduled for an outpatient infusion of Remdesivir at 11:30AM on Saturday 1/16, Sunday 1/17, Monday 1/18. Please report to [facility] at [address]  Drive to the security guard and tell them you are here for an infusion. They will direct you to the front entrance where we will come and get you.  For questions call [phone].  Thanks

## 2020-01-11 NOTE — Plan of Care (Signed)
  Problem: Education: Goal: Knowledge of General Education information will improve Description: Including pain rating scale, medication(s)/side effects and non-pharmacologic comfort measures Outcome: Progressing   Problem: Health Behavior/Discharge Planning: Goal: Ability to manage health-related needs will improve Outcome: Progressing   Problem: Clinical Measurements: Goal: Respiratory complications will improve Outcome: Not Progressing Note: Patient declined ambulating around the room due to dyspnea

## 2020-01-11 NOTE — Progress Notes (Signed)
Orders for CPAP at bedtime. Patient is Covid positive. Made patient aware that she could not use her home cpap unit per hospital policy for Covid. Explained options to patient. We could place her on our filtered V-60 unit or place patient on HFNC through the night. Patient would like to use our V-60 unit.  Placed on filtered V-60 unit with medium mask. Patient was displeased with mask, switched to small mask. RN called to notify me that patient still has complaints that masks are not working well for her. Explained to RN of our only other option, the HFNC. Patient has not indicated a choice at this time. Will continue to follow.

## 2020-01-11 NOTE — Discharge Summary (Signed)
[facility] [place] [facility]   PATIENT NAME: Jody Brewer    MR#:  [id number]  DATE OF BIRTH:  [birth year]  DATE OF ADMISSION:  01/09/2020 ADMITTING PHYSICIAN: Hannah Beat, MD  DATE OF DISCHARGE: 01/11/2020  PRIMARY CARE PHYSICIAN: Danella Penton, MD    ADMISSION DIAGNOSIS:  Pneumonia [J18.9] SOB (shortness of breath) [R06.02] COVID-19 [U07.1]  DISCHARGE DIAGNOSIS:   Acute respiratory failure due to COVID-19 infection  SECONDARY DIAGNOSIS:   Past Medical History:  Diagnosis Date  . Asthma   . Hypertension     HOSPITAL COURSE:   DeborahWinsteadis a69 y.o.Caucasian femalewith a known history of asthma and hypertension, who presented to the emergency room acute onset of persistent dry cough with associated dyspnea without wheezing for the last couple weeks. She had a positive COVID-19 test on 12/31. She admitted to fever which has been intermittent and up to 103 over the last couple weeks.   1. Acute hypoxemic respiratory failure secondary to COVID-19. -O2 protocol will be followed to keep O2 saturation above 93.  2.Pneumonia likely secondary to COVID-19.s. -Given multifocal pneumonia we will empirically place the patient on IV Rocephin and Zithromax --change to po antibiotic -Her procalcitonin is <0.1--d/w pt. Given that she is been having fevers intermittently at home I will finish course for seven days -remains afebrile throughout hospital stay.  - scheduled Mucinex and as needed Tussionex. -O2 protocol will be followed-- sats are 99% on room air patient exertional shortness of breath -patient on IV Remdisivir day 2/5-- she will go to [facility] outpatient infusion. Arrangement has been made. Patient has been given information. and IV steroid therapy with Decadron -- inch to oral Decadron -- on vitamin D3, vitamin C, zinc sulfate, p.o. Pepcid  -patient feels  Better has some lingering shortness of breath.  3. Asthma. She  has no current exacerbation.  -We will continue Singulair and as needed albuterol MDI.  4. Essential hypertension. -continue Benicar  5. GERD. PPI therapy will be resumed.  6. DVT prophylaxis.  Lovenox.  Answered list of questions which family had for patient to ask me.  Procedures:none Family communication : patient and left message for husband on the phone Consults : none Discharge Disposition : home CODE STATUS: full DVT Prophylaxis : lovenox CONSULTS OBTAINED:    DRUG ALLERGIES:  No Known Allergies  DISCHARGE MEDICATIONS:   Allergies as of 01/11/2020   No Known Allergies     Medication List    STOP taking these medications   azithromycin 500 MG tablet Commonly known as: ZITHROMAX   doxycycline 100 MG capsule Commonly known as: VIBRAMYCIN     TAKE these medications   albuterol 108 (90 Base) MCG/ACT inhaler Commonly known as: VENTOLIN HFA Inhale 1 puff into the lungs every 6 (six) hours as needed for wheezing or shortness of breath.   cefdinir 300 MG capsule Commonly known as: OMNICEF Take 1 capsule (300 mg total) by mouth every 12 (twelve) hours for 6 days.   cetirizine 10 MG tablet Commonly known as: ZYRTEC Take 10 mg by mouth daily.   chlorpheniramine-HYDROcodone 10-8 MG/5ML Suer Commonly known as: TUSSIONEX Take 5 mLs by mouth every 12 (twelve) hours as needed for cough.   cholecalciferol 25 MCG (1000 UNIT) tablet Commonly known as: VITAMIN D3 Take 1,000 Units by mouth 2 (two) times daily.   dexamethasone 6 MG tablet Commonly known as: DECADRON Take 1 tablet (6 mg total) by mouth daily.   estradiol 0.5 MG tablet  Commonly known as: ESTRACE Take 0.5 mg by mouth daily.   guaiFENesin-dextromethorphan 100-10 MG/5ML syrup Commonly known as: ROBITUSSIN DM Take 10 mLs by mouth every 4 (four) hours as needed for cough.   montelukast 10 MG tablet Commonly known as: SINGULAIR Take 10 mg by mouth daily.   Multi-Vitamin tablet Take 1  tablet by mouth daily.   nortriptyline 25 MG capsule Commonly known as: PAMELOR Take 25 mg by mouth at bedtime.   olmesartan-hydrochlorothiazide 20-12.5 MG tablet Commonly known as: BENICAR HCT Take 1 tablet by mouth daily.   pantoprazole 40 MG tablet Commonly known as: PROTONIX Take 40 mg by mouth 2 (two) times daily.   venlafaxine XR 75 MG 24 hr capsule Commonly known as: EFFEXOR-XR Take 75 mg by mouth daily.   vitamin E 180 MG (400 UNITS) capsule Take 400 Units by mouth daily.       If you experience worsening of your admission symptoms, develop shortness of breath, life threatening emergency, suicidal or homicidal thoughts you must seek medical attention immediately by calling 911 or calling your MD immediately  if symptoms less severe.  You Must read complete instructions/literature along with all the possible adverse reactions/side effects for all the Medicines you take and that have been prescribed to you. Take any new Medicines after you have completely understood and accept all the possible adverse reactions/side effects.   Please note  You were cared for by a hospitalist during your hospital stay. If you have any questions about your discharge medications or the care you received while you were in the hospital after you are discharged, you can call the unit and asked to speak with the hospitalist on call if the hospitalist that took care of you is not available. Once you are discharged, your primary care physician will handle any further medical issues. Please note that NO REFILLS for any discharge medications will be authorized once you are discharged, as it is imperative that you return to your primary care physician (or establish a relationship with a primary care physician if you do not have one) for your aftercare needs so that they can reassess your need for medications and monitor your lab values. Today   SUBJECTIVE   Overall better. No respiratory distress. Some  exertional shortness of breath. Sats 99% on room air  VITAL SIGNS:  Blood pressure 120/77, pulse 85, temperature 98.6 F (37 C), temperature source Oral, resp. rate 18, height 5\' 8"  (1.727 m), weight 83.1 kg, SpO2 100 %.  I/O:    Intake/Output Summary (Last 24 hours) at 01/11/2020 1108 Last data filed at 01/11/2020 0401 Gross per 24 hour  Intake --  Output 1300 ml  Net -1300 ml    PHYSICAL EXAMINATION:  GENERAL:  69 y.o.-year-old patient lying in the bed with no acute distress.  EYES: Pupils equal, round, reactive to light and accommodation. No scleral icterus. Extraocular muscles intact.  HEENT: Head atraumatic, normocephalic. Oropharynx and nasopharynx clear.  NECK:  Supple, no jugular venous distention. No thyroid enlargement, no tenderness.  LUNGS: Normal breath sounds bilaterally, no wheezing, rales,rhonchi or crepitation. No use of accessory muscles of respiration.  CARDIOVASCULAR: S1, S2 normal. No murmurs, rubs, or gallops.  ABDOMEN: Soft, non-tender, non-distended. Bowel sounds present. No organomegaly or mass.  EXTREMITIES: No pedal edema, cyanosis, or clubbing.  NEUROLOGIC: Cranial nerves II through XII are intact. Muscle strength 5/5 in all extremities. Sensation intact. Gait not checked.  PSYCHIATRIC: The patient is alert and oriented x 3.  SKIN:  No obvious rash, lesion, or ulcer.   DATA REVIEW:   CBC  Recent Labs  Lab 01/11/20 0341  WBC 10.6*  HGB 11.0*  HCT 32.9*  PLT 407*    Chemistries  Recent Labs  Lab 01/11/20 0341  NA 139  K 4.1  CL 107  CO2 25  GLUCOSE 96  BUN 30*  CREATININE 0.81  CALCIUM 8.8*  AST 24  ALT 37  ALKPHOS 64  BILITOT 1.0    Microbiology Results   Recent Results (from the past 240 hour(s))  Blood Culture (routine x 2)     Status: None (Preliminary result)   Collection Time: 01/10/20 12:16 AM   Specimen: BLOOD  Result Value Ref Range Status   Specimen Description BLOOD RIGHT ANTECUBITAL  Final   Special Requests    Final    BOTTLES DRAWN AEROBIC AND ANAEROBIC Blood Culture results may not be optimal due to an excessive volume of blood received in culture bottles   Culture   Final    NO GROWTH 1 DAY Performed at [facility], [address], [place], [place] [address]    Report Status PENDING  Incomplete  Blood Culture (routine x 2)     Status: None (Preliminary result)   Collection Time: 01/10/20 12:16 AM   Specimen: BLOOD  Result Value Ref Range Status   Specimen Description BLOOD LEFT ANTECUBITAL  Final   Special Requests   Final    BOTTLES DRAWN AEROBIC AND ANAEROBIC Blood Culture adequate volume   Culture   Final    NO GROWTH 1 DAY Performed at [facility], [address], [place], [place] [address]    Report Status PENDING  Incomplete    RADIOLOGY:  CT Angio Chest PE W and/or Wo Contrast  Result Date: 01/10/2020 CLINICAL DATA:  69 year old female with pneumonia, switched antibiotics recently. Persistent fever. Right side pleuritic pain. Positive for COVID-19 last month. EXAM: CT ANGIOGRAPHY CHEST WITH CONTRAST TECHNIQUE: Multidetector CT imaging of the chest was performed using the standard protocol during bolus administration of intravenous contrast. Multiplanar CT image reconstructions and MIPs were obtained to evaluate the vascular anatomy. CONTRAST:  166mL OMNIPAQUE IOHEXOL 350 MG/ML SOLN COMPARISON:  Portable chest earlier today, 01/05/2020. CTA chest 11/08/2018. FINDINGS: Cardiovascular: Good contrast bolus timing in the pulmonary arterial tree. No focal filling defect identified in the pulmonary arteries to suggest acute pulmonary embolism. No calcified coronary artery atherosclerosis is evident. No cardiomegaly or pericardial effusion. Negative visible aorta. Mediastinum/Nodes: Negative. No lymphadenopathy. Lungs/Pleura: Major airways remain patent. There is widespread scattered right lung peripheral and peribronchial ground-glass opacity in all lobes. In the  contralateral left lung there is only occasional mild peripheral and peribronchial ground-glass (series 7, image 51). No consolidation. No pleural effusion. Upper Abdomen: Surgically absent gallbladder. Negative visible liver, spleen, pancreas, adrenal glands, kidneys. Small gastric hiatal hernia otherwise negative visible bowel. Musculoskeletal: Posterior fusion hardware beginning in the lower thoracic spine and continuing inferiorly. Solid-appearing posterior element arthrodesis and superimposed additional levels of lower thoracic interbody ankylosis related to bridging endplate osteophytes. No acute osseous abnormality identified. Review of the MIP images confirms the above findings. IMPRESSION: 1. Negative for acute pulmonary embolus. 2. Widespread right lung and occasional left lung peripheral and peribronchial ground-glass opacity typical in appearance for COVID-19 pneumonia. Other viral/atypical etiology is possible. No pleural effusion. 3. No other acute findings in the chest. Electronically Signed   By: Genevie Ann M.D.   On: 01/10/2020 00:10   DG Chest [facility]  1 View  Result Date: 01/09/2020 CLINICAL DATA:  69 year old female with pneumonia, switched antibiotics recently. Persistent fever. Right side pleuritic pain. Positive for COVID-19. Last month. EXAM: PORTABLE CHEST 1 VIEW COMPARISON:  Portable chest 01/05/2020 and earlier. FINDINGS: Portable AP upright view at 2301 hours. Continued patchy and streaky opacity in the right lower lung. Density has mildly increased since 01/05/2020. No superimposed pneumothorax or pleural effusion. Elsewhere lung markings are stable. Normal lung volumes and mediastinal contours. Partially visible thoracolumbar posterior fusion hardware. IMPRESSION: Increased right lower lung pneumonia since 01/05/2020. No pleural effusion. Electronically Signed   By: Odessa Fleming M.D.   On: 01/09/2020 23:25     CODE STATUS:     Code Status Orders  (From admission, onward)          Start     Ordered   01/10/20 0111  Full code  Continuous     01/10/20 0116        Code Status History    Date Active Date Inactive Code Status Order ID Comments User Context   12/27/2019 1614 12/28/2019 1803 Full Code [id number]  Alberteen Sam, MD ED   Advance Care Planning Activity       TOTAL TIME TAKING CARE OF THIS PATIENT: *40* minutes.    Enedina Finner M.D on 01/11/2020 at 11:08 AM  Between 7am to 6pm - Pager - [phone] After 6pm go to [email or web address] - password [email or web address]  [facility]    CC: Primary care physician; Danella Penton, MD

## 2020-01-11 NOTE — Progress Notes (Signed)
Patient scheduled for outpatient Remdesivir infusion at 11:30AM on Saturday 1/16, Sunday 1/17, Monday 1/18.  Please advise them to report to [facility] at [address]  Drive to the security guard and tell them you are here for an infusion. They will direct you to the front entrance where we will come and get you.  For questions call [phone].  Thanks

## 2020-01-12 ENCOUNTER — Ambulatory Visit (HOSPITAL_COMMUNITY)
Admission: RE | Admit: 2020-01-12 | Discharge: 2020-01-12 | Disposition: A | Discharge status: Home / Self Care | Payer: Medicare Other | Source: Ambulatory Visit | Attending: Pulmonary Disease | Admitting: Pulmonary Disease

## 2020-01-12 VITALS — BP 128/64 | HR 80 | Temp 98.4°F | Resp 17

## 2020-01-12 DIAGNOSIS — U071 COVID-19: Secondary | ICD-10-CM | POA: Diagnosis present

## 2020-01-12 MED ORDER — METHYLPREDNISOLONE SODIUM SUCC 125 MG IJ SOLR: 125.0000 mg | Freq: Once | INTRAMUSCULAR | Status: DC | PRN

## 2020-01-12 MED ORDER — FAMOTIDINE IN NACL 20-0.9 MG/50ML-% IV SOLN: 20.0000 mg | Freq: Once | INTRAVENOUS | Status: DC | PRN

## 2020-01-12 MED ORDER — SODIUM CHLORIDE 0.9 % IV SOLN
INTRAVENOUS | Status: DC | PRN
  Administered 2020-01-12: 12:00:00 250 mL via INTRAVENOUS

## 2020-01-12 MED ORDER — DIPHENHYDRAMINE HCL 50 MG/ML IJ SOLN: 50.0000 mg | Freq: Once | INTRAMUSCULAR | Status: DC | PRN

## 2020-01-12 MED ORDER — SODIUM CHLORIDE 0.9 % IV SOLN
100.0000 mg | Freq: Once | INTRAVENOUS | Status: AC
  Administered 2020-01-12: 12:00:00 100 mg via INTRAVENOUS

## 2020-01-12 MED ORDER — EPINEPHRINE 0.3 MG/0.3ML IJ SOAJ: 0.3000 mg | Freq: Once | INTRAMUSCULAR | Status: DC | PRN

## 2020-01-12 MED ORDER — SODIUM CHLORIDE 0.9 % IV SOLN
INTRAVENOUS | Status: AC
  Filled 2020-01-12: qty 20

## 2020-01-12 MED ORDER — ALBUTEROL SULFATE HFA 108 (90 BASE) MCG/ACT IN AERS: 2.0000 | INHALATION_SPRAY | Freq: Once | RESPIRATORY_TRACT | Status: DC | PRN

## 2020-01-12 NOTE — Discharge Instructions (Signed)
10 Things You Can Do to Manage Your COVID-19 Symptoms at Home If you have possible or confirmed COVID-19: 1. Stay home from work and school. And stay away from other public places. If you must go out, avoid using any kind of public transportation, ridesharing, or taxis. 2. Monitor your symptoms carefully. If your symptoms get worse, call your healthcare provider immediately. 3. Get rest and stay hydrated. 4. If you have a medical appointment, call the healthcare provider ahead of time and tell them that you have or may have COVID-19. 5. For medical emergencies, call 911 and notify the dispatch personnel that you have or may have COVID-19. 6. Cover your cough and sneezes with a tissue or use the inside of your elbow. 7. Wash your hands often with soap and water for at least 20 seconds or clean your hands with an alcohol-based hand sanitizer that contains at least 60% alcohol. 8. As much as possible, stay in a specific room and away from other people in your home. Also, you should use a separate bathroom, if available. If you need to be around other people in or outside of the home, wear a mask. 9. Avoid sharing personal items with other people in your household, like dishes, towels, and bedding. 10. Clean all surfaces that are touched often, like counters, tabletops, and doorknobs. Use household cleaning sprays or wipes according to the label instructions. cdc.gov/coronavirus 06/27/2019 This information is not intended to replace advice given to you by your health care provider. Make sure you discuss any questions you have with your health care provider. Document Revised: 11/29/2019 Document Reviewed: 11/29/2019 Elsevier Patient Education  2020 [employer]   COVID-19 COVID-19 is a respiratory infection that is caused by a virus called severe acute respiratory syndrome coronavirus 2 (SARS-CoV-2). The disease is also known as coronavirus disease or novel coronavirus. In some people, the virus may  not cause any symptoms. In others, it may cause a serious infection. The infection can get worse quickly and can lead to complications, such as:  Pneumonia, or infection of the lungs.  Acute respiratory distress syndrome or ARDS. This is a condition in which fluid build-up in the lungs prevents the lungs from filling with air and passing oxygen into the blood.  Acute respiratory failure. This is a condition in which there is not enough oxygen passing from the lungs to the body or when carbon dioxide is not passing from the lungs out of the body.  Sepsis or septic shock. This is a serious bodily reaction to an infection.  Blood clotting problems.  Secondary infections due to bacteria or fungus.  Organ failure. This is when your body's organs stop working. The virus that causes COVID-19 is contagious. This means that it can spread from person to person through droplets from coughs and sneezes (respiratory secretions). What are the causes? This illness is caused by a virus. You may catch the virus by:  Breathing in droplets from an infected person. Droplets can be spread by a person breathing, speaking, singing, coughing, or sneezing.  Touching something, like a table or a doorknob, that was exposed to the virus (contaminated) and then touching your mouth, nose, or eyes. What increases the risk? Risk for infection You are more likely to be infected with this virus if you:  Are within 6 feet (2 meters) of a person with COVID-19.  Provide care for or live with a person who is infected with COVID-19.  Spend time in crowded indoor spaces or   live in shared housing. Risk for serious illness You are more likely to become seriously ill from the virus if you:  Are 50 years of age or older. The higher your age, the more you are at risk for serious illness.  Live in a nursing home or long-term care facility.  Have cancer.  Have a long-term (chronic) disease such as: ? Chronic lung disease,  including chronic obstructive pulmonary disease or asthma. ? A long-term disease that lowers your body's ability to fight infection (immunocompromised). ? Heart disease, including heart failure, a condition in which the arteries that lead to the heart become narrow or blocked (coronary artery disease), a disease which makes the heart muscle thick, weak, or stiff (cardiomyopathy). ? Diabetes. ? Chronic kidney disease. ? Sickle cell disease, a condition in which red blood cells have an abnormal "sickle" shape. ? Liver disease.  Are obese. What are the signs or symptoms? Symptoms of this condition can range from mild to severe. Symptoms may appear any time from 2 to 14 days after being exposed to the virus. They include:  A fever or chills.  A cough.  Difficulty breathing.  Headaches, body aches, or muscle aches.  Runny or stuffy (congested) nose.  A sore throat.  New loss of taste or smell. Some people may also have stomach problems, such as nausea, vomiting, or diarrhea. Other people may not have any symptoms of COVID-19. How is this diagnosed? This condition may be diagnosed based on:  Your signs and symptoms, especially if: ? You live in an area with a COVID-19 outbreak. ? You recently traveled to or from an area where the virus is common. ? You provide care for or live with a person who was diagnosed with COVID-19. ? You were exposed to a person who was diagnosed with COVID-19.  A physical exam.  Lab tests, which may include: ? Taking a sample of fluid from the back of your nose and throat (nasopharyngeal fluid), your nose, or your throat using a swab. ? A sample of mucus from your lungs (sputum). ? Blood tests.  Imaging tests, which may include, X-rays, CT scan, or ultrasound. How is this treated? At present, there is no medicine to treat COVID-19. Medicines that treat other diseases are being used on a trial basis to see if they are effective against COVID-19. Your  health care provider will talk with you about ways to treat your symptoms. For most people, the infection is mild and can be managed at home with rest, fluids, and over-the-counter medicines. Treatment for a serious infection usually takes places in a hospital intensive care unit (ICU). It may include one or more of the following treatments. These treatments are given until your symptoms improve.  Receiving fluids and medicines through an IV.  Supplemental oxygen. Extra oxygen is given through a tube in the nose, a face mask, or a hood.  Positioning you to lie on your stomach (prone position). This makes it easier for oxygen to get into the lungs.  Continuous positive airway pressure (CPAP) or bi-level positive airway pressure (BPAP) machine. This treatment uses mild air pressure to keep the airways open. A tube that is connected to a motor delivers oxygen to the body.  Ventilator. This treatment moves air into and out of the lungs by using a tube that is placed in your windpipe.  Tracheostomy. This is a procedure to create a hole in the neck so that a breathing tube can be inserted.  Extracorporeal membrane   oxygenation (ECMO). This procedure gives the lungs a chance to recover by taking over the functions of the heart and lungs. It supplies oxygen to the body and removes carbon dioxide. Follow these instructions at home: Lifestyle  If you are sick, stay home except to get medical care. Your health care provider will tell you how long to stay home. Call your health care provider before you go for medical care.  Rest at home as told by your health care provider.  Do not use any products that contain nicotine or tobacco, such as cigarettes, e-cigarettes, and chewing tobacco. If you need help quitting, ask your health care provider.  Return to your normal activities as told by your health care provider. Ask your health care provider what activities are safe for you. General  instructions  Take over-the-counter and prescription medicines only as told by your health care provider.  Drink enough fluid to keep your urine pale yellow.  Keep all follow-up visits as told by your health care provider. This is important. How is this prevented?  There is no vaccine to help prevent COVID-19 infection. However, there are steps you can take to protect yourself and others from this virus. To protect yourself:   Do not travel to areas where COVID-19 is a risk. The areas where COVID-19 is reported change often. To identify high-risk areas and travel restrictions, check the CDC travel website: wwwnc.cdc.gov/travel/notices  If you live in, or must travel to, an area where COVID-19 is a risk, take precautions to avoid infection. ? Stay away from people who are sick. ? Wash your hands often with soap and water for 20 seconds. If soap and water are not available, use an alcohol-based hand sanitizer. ? Avoid touching your mouth, face, eyes, or nose. ? Avoid going out in public, follow guidance from your state and local health authorities. ? If you must go out in public, wear a cloth face covering or face mask. Make sure your mask covers your nose and mouth. ? Avoid crowded indoor spaces. Stay at least 6 feet (2 meters) away from others. ? Disinfect objects and surfaces that are frequently touched every day. This may include:  Counters and tables.  Doorknobs and light switches.  Sinks and faucets.  Electronics, such as phones, remote controls, keyboards, computers, and tablets. To protect others: If you have symptoms of COVID-19, take steps to prevent the virus from spreading to others.  If you think you have a COVID-19 infection, contact your health care provider right away. Tell your health care team that you think you may have a COVID-19 infection.  Stay home. Leave your house only to seek medical care. Do not use public transport.  Do not travel while you are  sick.  Wash your hands often with soap and water for 20 seconds. If soap and water are not available, use alcohol-based hand sanitizer.  Stay away from other members of your household. Let healthy household members care for children and pets, if possible. If you have to care for children or pets, wash your hands often and wear a mask. If possible, stay in your own room, separate from others. Use a different bathroom.  Make sure that all people in your household wash their hands well and often.  Cough or sneeze into a tissue or your sleeve or elbow. Do not cough or sneeze into your hand or into the air.  Wear a cloth face covering or face mask. Make sure your mask covers your nose   and mouth. Where to find more information  Centers for Disease Control and Prevention: www.cdc.gov/coronavirus/2019-ncov/index.html  World Health Organization: www.who.int/health-topics/coronavirus Contact a health care provider if:  You live in or have traveled to an area where COVID-19 is a risk and you have symptoms of the infection.  You have had contact with someone who has COVID-19 and you have symptoms of the infection. Get help right away if:  You have trouble breathing.  You have pain or pressure in your chest.  You have confusion.  You have bluish lips and fingernails.  You have difficulty waking from sleep.  You have symptoms that get worse. These symptoms may represent a serious problem that is an emergency. Do not wait to see if the symptoms will go away. Get medical help right away. Call your local emergency services (911 in the [place] Do not drive yourself to the hospital. Let the emergency medical personnel know if you think you have COVID-19. Summary  COVID-19 is a respiratory infection that is caused by a virus. It is also known as coronavirus disease or novel coronavirus. It can cause serious infections, such as pneumonia, acute respiratory distress syndrome, acute respiratory failure,  or sepsis.  The virus that causes COVID-19 is contagious. This means that it can spread from person to person through droplets from breathing, speaking, singing, coughing, or sneezing.  You are more likely to develop a serious illness if you are 50 years of age or older, have a weak immune system, live in a nursing home, or have chronic disease.  There is no medicine to treat COVID-19. Your health care provider will talk with you about ways to treat your symptoms.  Take steps to protect yourself and others from infection. Wash your hands often and disinfect objects and surfaces that are frequently touched every day. Stay away from people who are sick and wear a mask if you are sick. This information is not intended to replace advice given to you by your health care provider. Make sure you discuss any questions you have with your health care provider. Document Revised: 10/12/2019 Document Reviewed: 01/18/2019 Elsevier Patient Education  2020 [employer]  What types of side effects do monoclonal antibody drugs cause?  Common side effects  In general, the more common side effects caused by monoclonal antibody drugs include: . Allergic reactions, such as hives or itching . Flu-like signs and symptoms, including chills, fatigue, fever, and muscle aches and pains . Nausea, vomiting . Diarrhea . Skin rashes . Low blood pressure   The CDC is recommending patients who receive monoclonal antibody treatments wait at least 90 days before being vaccinated.  Currently, there are no data on the safety and efficacy of mRNA COVID-19 vaccines in persons who received monoclonal antibodies or convalescent plasma as part of COVID-19 treatment. Based on the estimated half-life of such therapies as well as evidence suggesting that reinfection is uncommon in the 90 days after initial infection, vaccination should be deferred for at least 90 days, as a precautionary measure until additional information becomes  available, to avoid interference of the antibody treatment with vaccine-induced immune responses.  

## 2020-01-12 NOTE — Progress Notes (Signed)
  Diagnosis: COVID-19  Physician:Dr Wright  Procedure: [facility] Med: remdesivir infusion.  Complications: No immediate complications noted.  Discharge: Discharged home   Evania Lyne W 01/12/2020  

## 2020-01-13 ENCOUNTER — Ambulatory Visit (HOSPITAL_COMMUNITY)
Admit: 2020-01-13 | Discharge: 2020-01-13 | Disposition: A | Discharge status: Home / Self Care | Payer: Medicare Other | Attending: Pulmonary Disease | Admitting: Pulmonary Disease

## 2020-01-13 DIAGNOSIS — U071 COVID-19: Secondary | ICD-10-CM

## 2020-01-13 MED ORDER — METHYLPREDNISOLONE SODIUM SUCC 125 MG IJ SOLR: 125.0000 mg | Freq: Once | INTRAMUSCULAR | Status: DC | PRN

## 2020-01-13 MED ORDER — SODIUM CHLORIDE 0.9 % IV SOLN: INTRAVENOUS | Status: DC | PRN

## 2020-01-13 MED ORDER — EPINEPHRINE 0.3 MG/0.3ML IJ SOAJ: 0.3000 mg | Freq: Once | INTRAMUSCULAR | Status: DC | PRN

## 2020-01-13 MED ORDER — ALBUTEROL SULFATE HFA 108 (90 BASE) MCG/ACT IN AERS: 2.0000 | INHALATION_SPRAY | Freq: Once | RESPIRATORY_TRACT | Status: DC | PRN

## 2020-01-13 MED ORDER — DIPHENHYDRAMINE HCL 50 MG/ML IJ SOLN: 50.0000 mg | Freq: Once | INTRAMUSCULAR | Status: DC | PRN

## 2020-01-13 MED ORDER — FAMOTIDINE IN NACL 20-0.9 MG/50ML-% IV SOLN: 20.0000 mg | Freq: Once | INTRAVENOUS | Status: DC | PRN

## 2020-01-13 MED ORDER — SODIUM CHLORIDE 0.9 % IV SOLN: 100.0000 mg | Freq: Once | INTRAVENOUS | Status: DC

## 2020-01-13 MED ORDER — SODIUM CHLORIDE 0.9 % IV SOLN
INTRAVENOUS | Status: AC
  Filled 2020-01-13: qty 20

## 2020-01-13 MED ORDER — SODIUM CHLORIDE 0.9 % IV SOLN
100.0000 mg | Freq: Once | INTRAVENOUS | Status: AC
  Administered 2020-01-13: 12:00:00 100 mg via INTRAVENOUS

## 2020-01-13 NOTE — Progress Notes (Signed)
  Diagnosis: COVID-19  Physician: Bethann Punches, MD  Procedure: [facility] Med: remdesivir infusion.  Complications: No immediate complications noted.  Discharge: Discharged home   Rosi Secrist R Izel Eisenhardt 01/13/2020

## 2020-01-13 NOTE — Discharge Instructions (Signed)
10 Things You Can Do to Manage Your COVID-19 Symptoms at Home °If you have possible or confirmed COVID-19: °1. Stay home from work and school. And stay away from other public places. If you must go out, avoid using any kind of public transportation, ridesharing, or taxis. °2. Monitor your symptoms carefully. If your symptoms get worse, call your healthcare provider immediately. °3. Get rest and stay hydrated. °4. If you have a medical appointment, call the healthcare provider ahead of time and tell them that you have or may have COVID-19. °5. For medical emergencies, call 911 and notify the dispatch personnel that you have or may have COVID-19. °6. Cover your cough and sneezes with a tissue or use the inside of your elbow. °7. Wash your hands often with soap and water for at least 20 seconds or clean your hands with an alcohol-based hand sanitizer that contains at least 60% alcohol. °8. As much as possible, stay in a specific room and away from other people in your home. Also, you should use a separate bathroom, if available. If you need to be around other people in or outside of the home, wear a mask. °9. Avoid sharing personal items with other people in your household, like dishes, towels, and bedding. °10. Clean all surfaces that are touched often, like counters, tabletops, and doorknobs. Use household cleaning sprays or wipes according to the label instructions. °cdc.gov/coronavirus °06/27/2019 °This information is not intended to replace advice given to you by your health care provider. Make sure you discuss any questions you have with your health care provider. °Document Revised: 11/29/2019 Document Reviewed: 11/29/2019 °[employer] Patient Education © 2020 [employer] ° ° °COVID-19 °COVID-19 is a respiratory infection that is caused by a virus called severe acute respiratory syndrome coronavirus 2 (SARS-CoV-2). The disease is also known as coronavirus disease or novel coronavirus. In some people, the virus may  not cause any symptoms. In others, it may cause a serious infection. The infection can get worse quickly and can lead to complications, such as: °· Pneumonia, or infection of the lungs. °· Acute respiratory distress syndrome or ARDS. This is a condition in which fluid build-up in the lungs prevents the lungs from filling with air and passing oxygen into the blood. °· Acute respiratory failure. This is a condition in which there is not enough oxygen passing from the lungs to the body or when carbon dioxide is not passing from the lungs out of the body. °· Sepsis or septic shock. This is a serious bodily reaction to an infection. °· Blood clotting problems. °· Secondary infections due to bacteria or fungus. °· Organ failure. This is when your body's organs stop working. °The virus that causes COVID-19 is contagious. This means that it can spread from person to person through droplets from coughs and sneezes (respiratory secretions). °What are the causes? °This illness is caused by a virus. You may catch the virus by: °· Breathing in droplets from an infected person. Droplets can be spread by a person breathing, speaking, singing, coughing, or sneezing. °· Touching something, like a table or a doorknob, that was exposed to the virus (contaminated) and then touching your mouth, nose, or eyes. °What increases the risk? °Risk for infection °You are more likely to be infected with this virus if you: °· Are within 6 feet (2 meters) of a person with COVID-19. °· Provide care for or live with a person who is infected with COVID-19. °· Spend time in crowded indoor spaces or   live in shared housing. °Risk for serious illness °You are more likely to become seriously ill from the virus if you: °· Are 50 years of age or older. The higher your age, the more you are at risk for serious illness. °· Live in a nursing home or long-term care facility. °· Have cancer. °· Have a long-term (chronic) disease such as: °? Chronic lung disease,  including chronic obstructive pulmonary disease or asthma. °? A long-term disease that lowers your body's ability to fight infection (immunocompromised). °? Heart disease, including heart failure, a condition in which the arteries that lead to the heart become narrow or blocked (coronary artery disease), a disease which makes the heart muscle thick, weak, or stiff (cardiomyopathy). °? Diabetes. °? Chronic kidney disease. °? Sickle cell disease, a condition in which red blood cells have an abnormal "sickle" shape. °? Liver disease. °· Are obese. °What are the signs or symptoms? °Symptoms of this condition can range from mild to severe. Symptoms may appear any time from 2 to 14 days after being exposed to the virus. They include: °· A fever or chills. °· A cough. °· Difficulty breathing. °· Headaches, body aches, or muscle aches. °· Runny or stuffy (congested) nose. °· A sore throat. °· New loss of taste or smell. °Some people may also have stomach problems, such as nausea, vomiting, or diarrhea. °Other people may not have any symptoms of COVID-19. °How is this diagnosed? °This condition may be diagnosed based on: °· Your signs and symptoms, especially if: °? You live in an area with a COVID-19 outbreak. °? You recently traveled to or from an area where the virus is common. °? You provide care for or live with a person who was diagnosed with COVID-19. °? You were exposed to a person who was diagnosed with COVID-19. °· A physical exam. °· Lab tests, which may include: °? Taking a sample of fluid from the back of your nose and throat (nasopharyngeal fluid), your nose, or your throat using a swab. °? A sample of mucus from your lungs (sputum). °? Blood tests. °· Imaging tests, which may include, X-rays, CT scan, or ultrasound. °How is this treated? °At present, there is no medicine to treat COVID-19. Medicines that treat other diseases are being used on a trial basis to see if they are effective against COVID-19. °Your  health care provider will talk with you about ways to treat your symptoms. For most people, the infection is mild and can be managed at home with rest, fluids, and over-the-counter medicines. °Treatment for a serious infection usually takes places in a hospital intensive care unit (ICU). It may include one or more of the following treatments. These treatments are given until your symptoms improve. °· Receiving fluids and medicines through an IV. °· Supplemental oxygen. Extra oxygen is given through a tube in the nose, a face mask, or a hood. °· Positioning you to lie on your stomach (prone position). This makes it easier for oxygen to get into the lungs. °· Continuous positive airway pressure (CPAP) or bi-level positive airway pressure (BPAP) machine. This treatment uses mild air pressure to keep the airways open. A tube that is connected to a motor delivers oxygen to the body. °· Ventilator. This treatment moves air into and out of the lungs by using a tube that is placed in your windpipe. °· Tracheostomy. This is a procedure to create a hole in the neck so that a breathing tube can be inserted. °· Extracorporeal membrane   oxygenation (ECMO). This procedure gives the lungs a chance to recover by taking over the functions of the heart and lungs. It supplies oxygen to the body and removes carbon dioxide. °Follow these instructions at home: °Lifestyle °· If you are sick, stay home except to get medical care. Your health care provider will tell you how long to stay home. Call your health care provider before you go for medical care. °· Rest at home as told by your health care provider. °· Do not use any products that contain nicotine or tobacco, such as cigarettes, e-cigarettes, and chewing tobacco. If you need help quitting, ask your health care provider. °· Return to your normal activities as told by your health care provider. Ask your health care provider what activities are safe for you. °General  instructions °· Take over-the-counter and prescription medicines only as told by your health care provider. °· Drink enough fluid to keep your urine pale yellow. °· Keep all follow-up visits as told by your health care provider. This is important. °How is this prevented? ° °There is no vaccine to help prevent COVID-19 infection. However, there are steps you can take to protect yourself and others from this virus. °To protect yourself:  °· Do not travel to areas where COVID-19 is a risk. The areas where COVID-19 is reported change often. To identify high-risk areas and travel restrictions, check the CDC travel website: wwwnc.cdc.gov/travel/notices °· If you live in, or must travel to, an area where COVID-19 is a risk, take precautions to avoid infection. °? Stay away from people who are sick. °? Wash your hands often with soap and water for 20 seconds. If soap and water are not available, use an alcohol-based hand sanitizer. °? Avoid touching your mouth, face, eyes, or nose. °? Avoid going out in public, follow guidance from your state and local health authorities. °? If you must go out in public, wear a cloth face covering or face mask. Make sure your mask covers your nose and mouth. °? Avoid crowded indoor spaces. Stay at least 6 feet (2 meters) away from others. °? Disinfect objects and surfaces that are frequently touched every day. This may include: °§ Counters and tables. °§ Doorknobs and light switches. °§ Sinks and faucets. °§ Electronics, such as phones, remote controls, keyboards, computers, and tablets. °To protect others: °If you have symptoms of COVID-19, take steps to prevent the virus from spreading to others. °· If you think you have a COVID-19 infection, contact your health care provider right away. Tell your health care team that you think you may have a COVID-19 infection. °· Stay home. Leave your house only to seek medical care. Do not use public transport. °· Do not travel while you are  sick. °· Wash your hands often with soap and water for 20 seconds. If soap and water are not available, use alcohol-based hand sanitizer. °· Stay away from other members of your household. Let healthy household members care for children and pets, if possible. If you have to care for children or pets, wash your hands often and wear a mask. If possible, stay in your own room, separate from others. Use a different bathroom. °· Make sure that all people in your household wash their hands well and often. °· Cough or sneeze into a tissue or your sleeve or elbow. Do not cough or sneeze into your hand or into the air. °· Wear a cloth face covering or face mask. Make sure your mask covers your nose   and mouth. °Where to find more information °· Centers for Disease Control and Prevention: www.cdc.gov/coronavirus/2019-ncov/index.html °· World Health Organization: www.who.int/health-topics/coronavirus °Contact a health care provider if: °· You live in or have traveled to an area where COVID-19 is a risk and you have symptoms of the infection. °· You have had contact with someone who has COVID-19 and you have symptoms of the infection. °Get help right away if: °· You have trouble breathing. °· You have pain or pressure in your chest. °· You have confusion. °· You have bluish lips and fingernails. °· You have difficulty waking from sleep. °· You have symptoms that get worse. °These symptoms may represent a serious problem that is an emergency. Do not wait to see if the symptoms will go away. Get medical help right away. Call your local emergency services (911 in the [place] Do not drive yourself to the hospital. Let the emergency medical personnel know if you think you have COVID-19. °Summary °· COVID-19 is a respiratory infection that is caused by a virus. It is also known as coronavirus disease or novel coronavirus. It can cause serious infections, such as pneumonia, acute respiratory distress syndrome, acute respiratory failure,  or sepsis. °· The virus that causes COVID-19 is contagious. This means that it can spread from person to person through droplets from breathing, speaking, singing, coughing, or sneezing. °· You are more likely to develop a serious illness if you are 50 years of age or older, have a weak immune system, live in a nursing home, or have chronic disease. °· There is no medicine to treat COVID-19. Your health care provider will talk with you about ways to treat your symptoms. °· Take steps to protect yourself and others from infection. Wash your hands often and disinfect objects and surfaces that are frequently touched every day. Stay away from people who are sick and wear a mask if you are sick. °This information is not intended to replace advice given to you by your health care provider. Make sure you discuss any questions you have with your health care provider. °Document Revised: 10/12/2019 Document Reviewed: 01/18/2019 °[employer] Patient Education © 2020 [employer] ° °

## 2020-01-14 ENCOUNTER — Ambulatory Visit (HOSPITAL_COMMUNITY)
Admit: 2020-01-14 | Discharge: 2020-01-14 | Disposition: A | Discharge status: Home / Self Care | Payer: Medicare Other | Attending: Pulmonary Disease | Admitting: Pulmonary Disease

## 2020-01-14 DIAGNOSIS — U071 COVID-19: Secondary | ICD-10-CM | POA: Diagnosis not present

## 2020-01-14 MED ORDER — METHYLPREDNISOLONE SODIUM SUCC 125 MG IJ SOLR: 125.0000 mg | Freq: Once | INTRAMUSCULAR | Status: DC | PRN

## 2020-01-14 MED ORDER — SODIUM CHLORIDE 0.9 % IV SOLN
INTRAVENOUS | Status: AC
  Filled 2020-01-14: qty 20

## 2020-01-14 MED ORDER — ALBUTEROL SULFATE HFA 108 (90 BASE) MCG/ACT IN AERS: 2.0000 | INHALATION_SPRAY | Freq: Once | RESPIRATORY_TRACT | Status: DC | PRN

## 2020-01-14 MED ORDER — FAMOTIDINE IN NACL 20-0.9 MG/50ML-% IV SOLN: 20.0000 mg | Freq: Once | INTRAVENOUS | Status: DC | PRN

## 2020-01-14 MED ORDER — SODIUM CHLORIDE 0.9 % IV SOLN
100.0000 mg | Freq: Once | INTRAVENOUS | Status: AC
  Administered 2020-01-14: 12:00:00 100 mg via INTRAVENOUS

## 2020-01-14 MED ORDER — SODIUM CHLORIDE 0.9 % IV SOLN
INTRAVENOUS | Status: DC | PRN
  Administered 2020-01-14: 12:00:00 250 mL via INTRAVENOUS

## 2020-01-14 MED ORDER — EPINEPHRINE 0.3 MG/0.3ML IJ SOAJ: 0.3000 mg | Freq: Once | INTRAMUSCULAR | Status: DC | PRN

## 2020-01-14 MED ORDER — DIPHENHYDRAMINE HCL 50 MG/ML IJ SOLN: 50.0000 mg | Freq: Once | INTRAMUSCULAR | Status: DC | PRN

## 2020-01-14 NOTE — Discharge Instructions (Signed)
What types of side effects do monoclonal antibody drugs cause?  Common side effects  In general, the more common side effects caused by monoclonal antibody drugs include: . Allergic reactions, such as hives or itching . Flu-like signs and symptoms, including chills, fatigue, fever, and muscle aches and pains . Nausea, vomiting . Diarrhea . Skin rashes . Low blood pressure   The CDC is recommending patients who receive monoclonal antibody treatments wait at least 90 days before being vaccinated.  Currently, there are no data on the safety and efficacy of mRNA COVID-19 vaccines in persons who received monoclonal antibodies or convalescent plasma as part of COVID-19 treatment. Based on the estimated half-life of such therapies as well as evidence suggesting that reinfection is uncommon in the 90 days after initial infection, vaccination should be deferred for at least 90 days, as a precautionary measure until additional information becomes available, to avoid interference of the antibody treatment with vaccine-induced immune responses.  COVID-19 COVID-19 is a respiratory infection that is caused by a virus called severe acute respiratory syndrome coronavirus 2 (SARS-CoV-2). The disease is also known as coronavirus disease or novel coronavirus. In some people, the virus may not cause any symptoms. In others, it may cause a serious infection. The infection can get worse quickly and can lead to complications, such as:  Pneumonia, or infection of the lungs.  Acute respiratory distress syndrome or ARDS. This is a condition in which fluid build-up in the lungs prevents the lungs from filling with air and passing oxygen into the blood.  Acute respiratory failure. This is a condition in which there is not enough oxygen passing from the lungs to the body or when carbon dioxide is not passing from the lungs out of the body.  Sepsis or septic shock. This is a serious bodily reaction to an  infection.  Blood clotting problems.  Secondary infections due to bacteria or fungus.  Organ failure. This is when your body's organs stop working. The virus that causes COVID-19 is contagious. This means that it can spread from person to person through droplets from coughs and sneezes (respiratory secretions). What are the causes? This illness is caused by a virus. You may catch the virus by:  Breathing in droplets from an infected person. Droplets can be spread by a person breathing, speaking, singing, coughing, or sneezing.  Touching something, like a table or a doorknob, that was exposed to the virus (contaminated) and then touching your mouth, nose, or eyes. What increases the risk? Risk for infection You are more likely to be infected with this virus if you:  Are within 6 feet (2 meters) of a person with COVID-19.  Provide care for or live with a person who is infected with COVID-19.  Spend time in crowded indoor spaces or live in shared housing. Risk for serious illness You are more likely to become seriously ill from the virus if you:  Are 50 years of age or older. The higher your age, the more you are at risk for serious illness.  Live in a nursing home or long-term care facility.  Have cancer.  Have a long-term (chronic) disease such as: ? Chronic lung disease, including chronic obstructive pulmonary disease or asthma. ? A long-term disease that lowers your body's ability to fight infection (immunocompromised). ? Heart disease, including heart failure, a condition in which the arteries that lead to the heart become narrow or blocked (coronary artery disease), a disease which makes the heart muscle thick, weak, or   stiff (cardiomyopathy). ? Diabetes. ? Chronic kidney disease. ? Sickle cell disease, a condition in which red blood cells have an abnormal "sickle" shape. ? Liver disease.  Are obese. What are the signs or symptoms? Symptoms of this condition can range  from mild to severe. Symptoms may appear any time from 2 to 14 days after being exposed to the virus. They include:  A fever or chills.  A cough.  Difficulty breathing.  Headaches, body aches, or muscle aches.  Runny or stuffy (congested) nose.  A sore throat.  New loss of taste or smell. Some people may also have stomach problems, such as nausea, vomiting, or diarrhea. Other people may not have any symptoms of COVID-19. How is this diagnosed? This condition may be diagnosed based on:  Your signs and symptoms, especially if: ? You live in an area with a COVID-19 outbreak. ? You recently traveled to or from an area where the virus is common. ? You provide care for or live with a person who was diagnosed with COVID-19. ? You were exposed to a person who was diagnosed with COVID-19.  A physical exam.  Lab tests, which may include: ? Taking a sample of fluid from the back of your nose and throat (nasopharyngeal fluid), your nose, or your throat using a swab. ? A sample of mucus from your lungs (sputum). ? Blood tests.  Imaging tests, which may include, X-rays, CT scan, or ultrasound. How is this treated? At present, there is no medicine to treat COVID-19. Medicines that treat other diseases are being used on a trial basis to see if they are effective against COVID-19. Your health care provider will talk with you about ways to treat your symptoms. For most people, the infection is mild and can be managed at home with rest, fluids, and over-the-counter medicines. Treatment for a serious infection usually takes places in a hospital intensive care unit (ICU). It may include one or more of the following treatments. These treatments are given until your symptoms improve.  Receiving fluids and medicines through an IV.  Supplemental oxygen. Extra oxygen is given through a tube in the nose, a face mask, or a hood.  Positioning you to lie on your stomach (prone position). This makes it  easier for oxygen to get into the lungs.  Continuous positive airway pressure (CPAP) or bi-level positive airway pressure (BPAP) machine. This treatment uses mild air pressure to keep the airways open. A tube that is connected to a motor delivers oxygen to the body.  Ventilator. This treatment moves air into and out of the lungs by using a tube that is placed in your windpipe.  Tracheostomy. This is a procedure to create a hole in the neck so that a breathing tube can be inserted.  Extracorporeal membrane oxygenation (ECMO). This procedure gives the lungs a chance to recover by taking over the functions of the heart and lungs. It supplies oxygen to the body and removes carbon dioxide. Follow these instructions at home: Lifestyle  If you are sick, stay home except to get medical care. Your health care provider will tell you how long to stay home. Call your health care provider before you go for medical care.  Rest at home as told by your health care provider.  Do not use any products that contain nicotine or tobacco, such as cigarettes, e-cigarettes, and chewing tobacco. If you need help quitting, ask your health care provider.  Return to your normal activities as told by your   health care provider. Ask your health care provider what activities are safe for you. General instructions  Take over-the-counter and prescription medicines only as told by your health care provider.  Drink enough fluid to keep your urine pale yellow.  Keep all follow-up visits as told by your health care provider. This is important. How is this prevented?  There is no vaccine to help prevent COVID-19 infection. However, there are steps you can take to protect yourself and others from this virus. To protect yourself:   Do not travel to areas where COVID-19 is a risk. The areas where COVID-19 is reported change often. To identify high-risk areas and travel restrictions, check the CDC travel website:  wwwnc.cdc.gov/travel/notices  If you live in, or must travel to, an area where COVID-19 is a risk, take precautions to avoid infection. ? Stay away from people who are sick. ? Wash your hands often with soap and water for 20 seconds. If soap and water are not available, use an alcohol-based hand sanitizer. ? Avoid touching your mouth, face, eyes, or nose. ? Avoid going out in public, follow guidance from your state and local health authorities. ? If you must go out in public, wear a cloth face covering or face mask. Make sure your mask covers your nose and mouth. ? Avoid crowded indoor spaces. Stay at least 6 feet (2 meters) away from others. ? Disinfect objects and surfaces that are frequently touched every day. This may include:  Counters and tables.  Doorknobs and light switches.  Sinks and faucets.  Electronics, such as phones, remote controls, keyboards, computers, and tablets. To protect others: If you have symptoms of COVID-19, take steps to prevent the virus from spreading to others.  If you think you have a COVID-19 infection, contact your health care provider right away. Tell your health care team that you think you may have a COVID-19 infection.  Stay home. Leave your house only to seek medical care. Do not use public transport.  Do not travel while you are sick.  Wash your hands often with soap and water for 20 seconds. If soap and water are not available, use alcohol-based hand sanitizer.  Stay away from other members of your household. Let healthy household members care for children and pets, if possible. If you have to care for children or pets, wash your hands often and wear a mask. If possible, stay in your own room, separate from others. Use a different bathroom.  Make sure that all people in your household wash their hands well and often.  Cough or sneeze into a tissue or your sleeve or elbow. Do not cough or sneeze into your hand or into the air.  Wear a cloth  face covering or face mask. Make sure your mask covers your nose and mouth. Where to find more information  Centers for Disease Control and Prevention: www.cdc.gov/coronavirus/2019-ncov/index.html  World Health Organization: www.who.int/health-topics/coronavirus Contact a health care provider if:  You live in or have traveled to an area where COVID-19 is a risk and you have symptoms of the infection.  You have had contact with someone who has COVID-19 and you have symptoms of the infection. Get help right away if:  You have trouble breathing.  You have pain or pressure in your chest.  You have confusion.  You have bluish lips and fingernails.  You have difficulty waking from sleep.  You have symptoms that get worse. These symptoms may represent a serious problem that is an emergency. Do   not wait to see if the symptoms will go away. Get medical help right away. Call your local emergency services (911 in the [place] Do not drive yourself to the hospital. Let the emergency medical personnel know if you think you have COVID-19. Summary  COVID-19 is a respiratory infection that is caused by a virus. It is also known as coronavirus disease or novel coronavirus. It can cause serious infections, such as pneumonia, acute respiratory distress syndrome, acute respiratory failure, or sepsis.  The virus that causes COVID-19 is contagious. This means that it can spread from person to person through droplets from breathing, speaking, singing, coughing, or sneezing.  You are more likely to develop a serious illness if you are 50 years of age or older, have a weak immune system, live in a nursing home, or have chronic disease.  There is no medicine to treat COVID-19. Your health care provider will talk with you about ways to treat your symptoms.  Take steps to protect yourself and others from infection. Wash your hands often and disinfect objects and surfaces that are frequently touched every day.  Stay away from people who are sick and wear a mask if you are sick. This information is not intended to replace advice given to you by your health care provider. Make sure you discuss any questions you have with your health care provider. Document Revised: 10/12/2019 Document Reviewed: 01/18/2019 Elsevier Patient Education  2020 [employer] Inc[employer]  

## 2020-01-14 NOTE — Progress Notes (Signed)
  Diagnosis: COVID-19  Physician:dr wright Procedure: [facility] Med: remdesivir infusion.  Complications: No immediate complications noted.  Discharge: Discharged home   Jody Brewer [name] 01/14/2020  

## 2020-01-15 ENCOUNTER — Emergency Department (HOSPITAL_COMMUNITY): Payer: Medicare Other

## 2020-01-15 ENCOUNTER — Encounter (HOSPITAL_COMMUNITY): Payer: Self-pay | Admitting: Emergency Medicine

## 2020-01-15 ENCOUNTER — Observation Stay (HOSPITAL_COMMUNITY)
Admission: EM | Admit: 2020-01-15 | Discharge: 2020-01-17 | Disposition: A | Discharge status: Home / Self Care | Payer: Medicare Other | Attending: Family Medicine | Admitting: Family Medicine

## 2020-01-15 ENCOUNTER — Other Ambulatory Visit: Payer: Self-pay

## 2020-01-15 DIAGNOSIS — E860 Dehydration: Secondary | ICD-10-CM | POA: Diagnosis not present

## 2020-01-15 DIAGNOSIS — Z7952 Long term (current) use of systemic steroids: Secondary | ICD-10-CM | POA: Insufficient documentation

## 2020-01-15 DIAGNOSIS — R0789 Other chest pain: Secondary | ICD-10-CM | POA: Diagnosis present

## 2020-01-15 DIAGNOSIS — J1282 Pneumonia due to coronavirus disease 2019: Secondary | ICD-10-CM | POA: Diagnosis not present

## 2020-01-15 DIAGNOSIS — N179 Acute kidney failure, unspecified: Secondary | ICD-10-CM | POA: Diagnosis present

## 2020-01-15 DIAGNOSIS — U071 COVID-19: Secondary | ICD-10-CM | POA: Diagnosis present

## 2020-01-15 DIAGNOSIS — G4733 Obstructive sleep apnea (adult) (pediatric): Secondary | ICD-10-CM | POA: Diagnosis not present

## 2020-01-15 DIAGNOSIS — Z8249 Family history of ischemic heart disease and other diseases of the circulatory system: Secondary | ICD-10-CM | POA: Insufficient documentation

## 2020-01-15 DIAGNOSIS — I2510 Atherosclerotic heart disease of native coronary artery without angina pectoris: Secondary | ICD-10-CM | POA: Diagnosis not present

## 2020-01-15 DIAGNOSIS — I10 Essential (primary) hypertension: Secondary | ICD-10-CM | POA: Diagnosis not present

## 2020-01-15 DIAGNOSIS — M489 Spondylopathy, unspecified: Secondary | ICD-10-CM | POA: Diagnosis not present

## 2020-01-15 DIAGNOSIS — R079 Chest pain, unspecified: Secondary | ICD-10-CM | POA: Diagnosis not present

## 2020-01-15 DIAGNOSIS — Z79899 Other long term (current) drug therapy: Secondary | ICD-10-CM | POA: Insufficient documentation

## 2020-01-15 DIAGNOSIS — J4520 Mild intermittent asthma, uncomplicated: Secondary | ICD-10-CM | POA: Diagnosis present

## 2020-01-15 HISTORY — DX: Acute kidney failure, unspecified: N17.9

## 2020-01-15 HISTORY — DX: COVID-19: U07.1

## 2020-01-15 LAB — COMPREHENSIVE METABOLIC PANEL
ALT: 32 U/L (ref 0–44)
AST: 20 U/L (ref 15–41)
Albumin: 3.6 g/dL (ref 3.5–5.0)
Alkaline Phosphatase: 63 U/L (ref 38–126)
Anion gap: 13 (ref 5–15)
BUN: 33 mg/dL — ABNORMAL HIGH (ref 8–23)
CO2: 20 mmol/L — ABNORMAL LOW (ref 22–32)
Calcium: 9.2 mg/dL (ref 8.9–10.3)
Chloride: 103 mmol/L (ref 98–111)
Creatinine, Ser: 1.19 mg/dL — ABNORMAL HIGH (ref 0.44–1.00)
GFR calc Af Amer: 55 mL/min — ABNORMAL LOW (ref >60)
GFR calc non Af Amer: 47 mL/min — ABNORMAL LOW (ref >60)
Glucose, Bld: 103 mg/dL — ABNORMAL HIGH (ref 70–99)
Potassium: 3.4 mmol/L — ABNORMAL LOW (ref 3.5–5.1)
Sodium: 136 mmol/L (ref 135–145)
Total Bilirubin: 1.4 mg/dL — ABNORMAL HIGH (ref 0.3–1.2)
Total Protein: 6.6 g/dL (ref 6.5–8.1)

## 2020-01-15 LAB — CBC WITH DIFFERENTIAL/PLATELET
Abs Immature Granulocytes: 0.05 10*3/uL (ref 0.00–0.07)
Basophils Absolute: 0 10*3/uL (ref 0.0–0.1)
Basophils Relative: 0 %
Eosinophils Absolute: 0 10*3/uL (ref 0.0–0.5)
Eosinophils Relative: 0 %
HCT: 36.8 % (ref 36.0–46.0)
Hemoglobin: 12.4 g/dL (ref 12.0–15.0)
Immature Granulocytes: 1 %
Lymphocytes Relative: 22 %
Lymphs Abs: 2 10*3/uL (ref 0.7–4.0)
MCH: 30.8 pg (ref 26.0–34.0)
MCHC: 33.7 g/dL (ref 30.0–36.0)
MCV: 91.3 fL (ref 80.0–100.0)
Monocytes Absolute: 1 10*3/uL (ref 0.1–1.0)
Monocytes Relative: 11 %
Neutro Abs: 6.2 10*3/uL (ref 1.7–7.7)
Neutrophils Relative %: 66 %
Platelets: 355 10*3/uL (ref 150–400)
RBC: 4.03 MIL/uL (ref 3.87–5.11)
RDW: 13 % (ref 11.5–15.5)
WBC: 9.3 10*3/uL (ref 4.0–10.5)
nRBC: 0 % (ref 0.0–0.2)

## 2020-01-15 LAB — CULTURE, BLOOD (ROUTINE X 2)
Culture: NO GROWTH
Culture: NO GROWTH
Special Requests: ADEQUATE

## 2020-01-15 LAB — MAGNESIUM: Magnesium: 2.4 mg/dL (ref 1.7–2.4)

## 2020-01-15 LAB — PROTIME-INR
INR: 1 (ref 0.8–1.2)
Prothrombin Time: 13.5 seconds (ref 11.4–15.2)

## 2020-01-15 LAB — CBG MONITORING, ED: Glucose-Capillary: 87 mg/dL (ref 70–99)

## 2020-01-15 LAB — BRAIN NATRIURETIC PEPTIDE: B Natriuretic Peptide: 20.2 pg/mL (ref 0.0–100.0)

## 2020-01-15 LAB — TROPONIN I (HIGH SENSITIVITY)
Troponin I (High Sensitivity): 5 ng/L (ref <18)
Troponin I (High Sensitivity): 10 ng/L (ref <18)

## 2020-01-15 MED ORDER — HYDRALAZINE HCL 20 MG/ML IJ SOLN: 10.0000 mg | INTRAMUSCULAR | Status: DC | PRN

## 2020-01-15 MED ORDER — ADULT MULTIVITAMIN W/MINERALS CH
1.0000 | ORAL_TABLET | Freq: Every day | ORAL | Status: DC
  Administered 2020-01-16 – 2020-01-17 (×2): 1 via ORAL
  Filled 2020-01-15 (×2): qty 1

## 2020-01-15 MED ORDER — FENTANYL CITRATE (PF) 100 MCG/2ML IJ SOLN
50.0000 ug | Freq: Once | INTRAMUSCULAR | Status: AC
  Administered 2020-01-15: 50 ug via INTRAVENOUS
  Filled 2020-01-15: qty 2

## 2020-01-15 MED ORDER — VENLAFAXINE HCL ER 75 MG PO CP24
75.0000 mg | ORAL_CAPSULE | Freq: Every day | ORAL | Status: DC
  Administered 2020-01-16 – 2020-01-17 (×2): 75 mg via ORAL
  Filled 2020-01-15 (×3): qty 1

## 2020-01-15 MED ORDER — ONDANSETRON HCL 4 MG/2ML IJ SOLN
4.0000 mg | Freq: Once | INTRAMUSCULAR | Status: AC
  Administered 2020-01-15: 4 mg via INTRAVENOUS
  Filled 2020-01-15: qty 2

## 2020-01-15 MED ORDER — ALBUTEROL SULFATE HFA 108 (90 BASE) MCG/ACT IN AERS
2.0000 | INHALATION_SPRAY | RESPIRATORY_TRACT | Status: DC | PRN
  Filled 2020-01-15: qty 6.7

## 2020-01-15 MED ORDER — ASPIRIN 81 MG PO CHEW
324.0000 mg | CHEWABLE_TABLET | Freq: Once | ORAL | Status: DC
  Filled 2020-01-15: qty 4

## 2020-01-15 MED ORDER — MONTELUKAST SODIUM 10 MG PO TABS
10.0000 mg | ORAL_TABLET | Freq: Every evening | ORAL | Status: DC
  Administered 2020-01-16: 10 mg via ORAL
  Filled 2020-01-15: qty 1

## 2020-01-15 MED ORDER — NITROGLYCERIN 0.4 MG SL SUBL
0.4000 mg | SUBLINGUAL_TABLET | Freq: Once | SUBLINGUAL | Status: DC
  Filled 2020-01-15: qty 1

## 2020-01-15 MED ORDER — NORTRIPTYLINE HCL 25 MG PO CAPS
25.0000 mg | ORAL_CAPSULE | Freq: Every day | ORAL | Status: DC
  Administered 2020-01-16: 25 mg via ORAL
  Filled 2020-01-15 (×4): qty 1

## 2020-01-15 MED ORDER — ACETAMINOPHEN 325 MG PO TABS
650.0000 mg | ORAL_TABLET | Freq: Four times a day (QID) | ORAL | Status: DC | PRN
  Administered 2020-01-16: 650 mg via ORAL
  Filled 2020-01-15: qty 2

## 2020-01-15 MED ORDER — ONDANSETRON HCL 4 MG/2ML IJ SOLN: 4.0000 mg | Freq: Four times a day (QID) | INTRAMUSCULAR | Status: DC | PRN

## 2020-01-15 MED ORDER — PANTOPRAZOLE SODIUM 40 MG PO TBEC
40.0000 mg | DELAYED_RELEASE_TABLET | Freq: Two times a day (BID) | ORAL | Status: DC
  Administered 2020-01-16 – 2020-01-17 (×4): 40 mg via ORAL
  Filled 2020-01-15 (×4): qty 1

## 2020-01-15 MED ORDER — VITAMIN D 25 MCG (1000 UNIT) PO TABS
1000.0000 [IU] | ORAL_TABLET | Freq: Two times a day (BID) | ORAL | Status: DC
  Administered 2020-01-16 – 2020-01-17 (×4): 1000 [IU] via ORAL
  Filled 2020-01-15 (×4): qty 1

## 2020-01-15 MED ORDER — SODIUM CHLORIDE 0.9 % IV SOLN: INTRAVENOUS | Status: AC

## 2020-01-15 MED ORDER — ASCORBIC ACID 500 MG PO TABS
500.0000 mg | ORAL_TABLET | Freq: Every day | ORAL | Status: DC
  Administered 2020-01-16 – 2020-01-17 (×2): 500 mg via ORAL
  Filled 2020-01-15 (×2): qty 1

## 2020-01-15 MED ORDER — ENOXAPARIN SODIUM 40 MG/0.4ML ~~LOC~~ SOLN
40.0000 mg | Freq: Every day | SUBCUTANEOUS | Status: DC
  Administered 2020-01-16 – 2020-01-17 (×2): 40 mg via SUBCUTANEOUS
  Filled 2020-01-15 (×2): qty 0.4

## 2020-01-15 MED ORDER — DEXAMETHASONE 4 MG PO TABS
6.0000 mg | ORAL_TABLET | Freq: Every day | ORAL | Status: DC
  Administered 2020-01-16 – 2020-01-17 (×2): 6 mg via ORAL
  Filled 2020-01-15 (×2): qty 2

## 2020-01-15 MED ORDER — VITAMIN E 180 MG (400 UNIT) PO CAPS
400.0000 [IU] | ORAL_CAPSULE | Freq: Every day | ORAL | Status: DC
  Administered 2020-01-16 – 2020-01-17 (×2): 400 [IU] via ORAL
  Filled 2020-01-15 (×3): qty 1

## 2020-01-15 MED ORDER — ZINC SULFATE 220 (50 ZN) MG PO CAPS
220.0000 mg | ORAL_CAPSULE | Freq: Every day | ORAL | Status: DC
  Administered 2020-01-16 – 2020-01-17 (×2): 220 mg via ORAL
  Filled 2020-01-15 (×2): qty 1

## 2020-01-15 MED ORDER — IOHEXOL 350 MG/ML SOLN
100.0000 mL | Freq: Once | INTRAVENOUS | Status: AC | PRN
  Administered 2020-01-15: 100 mL via INTRAVENOUS

## 2020-01-15 MED ORDER — ONDANSETRON HCL 4 MG PO TABS: 4.0000 mg | ORAL_TABLET | Freq: Four times a day (QID) | ORAL | Status: DC | PRN

## 2020-01-15 MED ORDER — ACETAMINOPHEN 650 MG RE SUPP: 650.0000 mg | Freq: Four times a day (QID) | RECTAL | Status: DC | PRN

## 2020-01-15 NOTE — ED Notes (Signed)
Pt complains of sharp sternal chest pain that radiates through to her back. Pt is crying. MD notified.

## 2020-01-15 NOTE — H&P (Signed)
History and Physical    ATHENE SCHUHMACHER ZDG:[id number] DOB: December 06, 1951 DOA: 01/15/2020  PCP: Rusty Aus, MD  Patient coming from: Home.  Chief Complaint: Chest pain.  HPI: Jody Brewer is a 69 y.o. female with history of hypertension sleep apnea discharge recently after being treated for COVID-19 infection presently on Decadron and Omnicef still has 5 more days of Decadron left presents to the ER after patient started having chest pain.  Patient states this afternoon after walking down the stairs at her house patient started having substernal pressure-like symptom with some nausea and diaphoresis.  Lasted for around 15 minutes.  EMS was called and patient was found to have low blood pressure at that time.  Patient was brought to the ER.  Patient chest pain subsided without any intervention at that time.  ED Course: In the ER there are some concern for patient having possible ST elevation in the rhythm strip but ER physician discussed with cardiologist and at this time patient chest pain is resolved troponin was negative and cardiology felt they were nonspecific findings.  EKG just shows some ST-T changes in the V2 V3.  Patient admitted for further management of chest pain.  Covid test is positive again.  But patient is asymptomatic from Covid standpoint of view.  Labs show creatinine 1.1 BNP 20.2 high-sensitivity troponin initially was negative started increasing to 33 at 31.  CBC largely unremarkable.  Review of Systems: As per HPI, rest all negative.   Past Medical History:  Diagnosis Date  . Asthma   . COVID-19   . Hypertension     Past Surgical History:  Procedure Laterality Date  . ABDOMINAL HYSTERECTOMY    . APPENDECTOMY    . CHOLECYSTECTOMY    . OOPHORECTOMY     1 ovary removed     reports that she has never smoked. She has never used smokeless tobacco. She reports previous alcohol use. She reports previous drug use.  No Known Allergies  Family History    Problem Relation Age of Onset  . Emphysema Mother   . Parkinson's disease Father   . Dementia Father   . Hypertension Father     Prior to Admission medications   Medication Sig Start Date End Date Taking? Authorizing Provider  albuterol (VENTOLIN HFA) 108 (90 Base) MCG/ACT inhaler Inhale 2 puffs into the lungs See admin instructions. Inhale 2 puffs into the lungs every three to four hours as needed for shortness of breath or wheezing   Yes [provider]  Ascorbic Acid (VITAMIN C) 500 MG CHEW Chew 500 mg by mouth daily.   Yes [provider]  cefdinir (OMNICEF) 300 MG capsule Take 1 capsule (300 mg total) by mouth every 12 (twelve) hours for 6 days. 01/11/20 01/17/20 Yes Fritzi Mandes, MD  cetirizine (ZYRTEC) 10 MG tablet Take 10 mg by mouth daily.   Yes [provider]  chlorpheniramine-HYDROcodone (TUSSIONEX) 10-8 MG/5ML SUER Take 5 mLs by mouth every 12 (twelve) hours as needed for cough. 01/11/20  Yes Fritzi Mandes, MD  cholecalciferol (VITAMIN D3) 25 MCG (1000 UT) tablet Take 1,000 Units by mouth 2 (two) times daily.   Yes [provider]  dexamethasone (DECADRON) 6 MG tablet Take 1 tablet (6 mg total) by mouth daily. Patient taking differently: Take 6 mg by mouth daily after lunch.  01/11/20  Yes Fritzi Mandes, MD  estradiol (ESTRACE) 0.5 MG tablet Take 0.5 mg by mouth daily. 10/15/19  Yes [provider]  montelukast (SINGULAIR) 10 MG tablet Take 10 mg by mouth every evening.  10/15/19  Yes [provider]  Multiple Vitamin (MULTI-VITAMIN) tablet Take 1 tablet by mouth daily.   Yes [provider]  nortriptyline (PAMELOR) 25 MG capsule Take 25 mg by mouth at bedtime. 11/26/19  Yes [provider]  olmesartan-hydrochlorothiazide (BENICAR HCT) 20-12.5 MG tablet Take 1 tablet by mouth daily. 11/17/19  Yes [provider]  pantoprazole (PROTONIX) 40 MG tablet Take 40 mg by mouth 2 (two) times daily. 10/15/19  Yes  [provider]  venlafaxine XR (EFFEXOR-XR) 75 MG 24 hr capsule Take 75 mg by mouth daily. 10/15/19  Yes [provider]  vitamin E 400 UNIT capsule Take 400 Units by mouth daily.   Yes [provider]  zinc gluconate 50 MG tablet Take 50 mg by mouth daily.   Yes [provider]  guaiFENesin-dextromethorphan (ROBITUSSIN DM) 100-10 MG/5ML syrup Take 10 mLs by mouth every 4 (four) hours as needed for cough. Patient not taking: Reported on 01/15/2020 01/11/20   Enedina Finner, MD    Physical Exam: Constitutional: Moderately built and nourished. Vitals:   01/15/20 1442 01/15/20 1502 01/15/20 1815 01/15/20 2030  BP: 122/61  139/82   Pulse: 65  66 81  Resp: 16  19 18   Temp: 97.7 F (36.5 C)     TempSrc: Oral     SpO2: 99% 100% 100% 99%   Eyes: Anicteric no pallor. ENMT: No discharge from the ears eyes nose or mouth. Neck: No mass felt.  No neck rigidity. Respiratory: No rhonchi or crepitations. Cardiovascular: S1-S2 heard. Abdomen: Soft nontender bowel sounds present. Musculoskeletal: No edema. Skin: No rash. Neurologic: Alert awake oriented time place and person.  Moves all extremities. Psychiatric: Appears normal per normal affect.   Labs on Admission: I have personally reviewed following labs and imaging studies  CBC: Recent Labs  Lab 01/09/20 1838 01/11/20 0341 01/15/20 1455  WBC 11.0* 10.6* 9.3  NEUTROABS 9.2* 7.5 6.2  HGB 13.4 11.0* 12.4  HCT 39.3 32.9* 36.8  MCV 89.1 89.6 91.3  PLT 520* 407* 355   Basic Metabolic Panel: Recent Labs  Lab 01/09/20 1838 01/11/20 0341 01/15/20 1455  NA 137 139 136  K 4.3 4.1 3.4*  CL 104 107 103  CO2 21* 25 20*  GLUCOSE 108* 96 103*  BUN 29* 30* 33*  CREATININE 0.95 0.81 1.19*  CALCIUM 9.8 8.8* 9.2  MG  --   --  2.4   GFR: Estimated Creatinine Clearance: 51.9 mL/min (A) (by C-G formula based on SCr of 1.19 mg/dL (H)). Liver Function Tests: Recent Labs  Lab 01/09/20 1838 01/11/20 0341  01/15/20 1455  AST 30 24 20   ALT 44 37 32  ALKPHOS 73 64 63  BILITOT 0.9 1.0 1.4*  PROT 7.3 6.3* 6.6  ALBUMIN 3.8 3.2* 3.6   No results for input(s): LIPASE, AMYLASE in the last 168 hours. No results for input(s): AMMONIA in the last 168 hours. Coagulation Profile: Recent Labs  Lab 01/10/20 0016 01/15/20 1455  INR 1.0 1.0   Cardiac Enzymes: No results for input(s): CKTOTAL, CKMB, CKMBINDEX, TROPONINI in the last 168 hours. BNP (last 3 results) No results for input(s): PROBNP in the last 8760 hours. HbA1C: No results for input(s): HGBA1C in the last 72 hours. CBG: Recent Labs  Lab 01/15/20 1520  GLUCAP 87   Lipid Profile: No results for input(s): CHOL, HDL, LDLCALC, TRIG, CHOLHDL, LDLDIRECT in the last 72 hours. Thyroid Function  Tests: No results for input(s): TSH, T4TOTAL, FREET4, T3FREE, THYROIDAB in the last 72 hours. Anemia Panel: No results for input(s): VITAMINB12, FOLATE, FERRITIN, TIBC, IRON, RETICCTPCT in the last 72 hours. Urine analysis:    Component Value Date/Time   COLORURINE STRAW (A) 09/26/2018 2000   APPEARANCEUR CLEAR (A) 09/26/2018 2000   LABSPEC 1.011 09/26/2018 2000   PHURINE 6.0 09/26/2018 2000   GLUCOSEU NEGATIVE 09/26/2018 2000   HGBUR NEGATIVE 09/26/2018 2000   BILIRUBINUR NEGATIVE 09/26/2018 2000   KETONESUR NEGATIVE 09/26/2018 2000   PROTEINUR NEGATIVE 09/26/2018 2000   NITRITE NEGATIVE 09/26/2018 2000   LEUKOCYTESUR NEGATIVE 09/26/2018 2000   Sepsis Labs: @LABRCNTIP (procalcitonin:4,lacticidven:4) ) Recent Results (from the past 240 hour(s))  Blood Culture (routine x 2)     Status: None   Collection Time: 01/10/20 12:16 AM   Specimen: BLOOD  Result Value Ref Range Status   Specimen Description BLOOD RIGHT ANTECUBITAL  Final   Special Requests   Final    BOTTLES DRAWN AEROBIC AND ANAEROBIC Blood Culture results may not be optimal due to an excessive volume of blood received in culture bottles   Culture   Final    NO GROWTH 5  DAYS Performed at [facility], [address] [place], [place]    Report Status 01/15/2020 FINAL  Final  Blood Culture (routine x 2)     Status: None   Collection Time: 01/10/20 12:16 AM   Specimen: BLOOD  Result Value Ref Range Status   Specimen Description BLOOD LEFT ANTECUBITAL  Final   Special Requests   Final    BOTTLES DRAWN AEROBIC AND ANAEROBIC Blood Culture adequate volume   Culture   Final    NO GROWTH 5 DAYS Performed at [facility], [address], [place], [place]    Report Status 01/15/2020 FINAL  Final     Radiological Exams on Admission: CT ANGIO CHEST PE W OR WO CONTRAST  Result Date: 01/15/2020 CLINICAL DATA:  69 year old female with chest pain and shortness of breath. EXAM: CT ANGIOGRAPHY CHEST WITH CONTRAST TECHNIQUE: Multidetector CT imaging of the chest was performed using the standard protocol during bolus administration of intravenous contrast. Multiplanar CT image reconstructions and MIPs were obtained to evaluate the vascular anatomy. CONTRAST:  79 OMNIPAQUE IOHEXOL 350 MG/ML SOLN COMPARISON:  Chest CT dated 01/09/2020. FINDINGS: Cardiovascular: There is no cardiomegaly or pericardial effusion. The thoracic aorta is unremarkable. The origins of the great vessels of the aortic arch appear patent as visualized. No pulmonary artery embolus identified. Mediastinum/Nodes: There is no hilar or mediastinal adenopathy. There is a small hiatal hernia. The esophagus and the thyroid gland are grossly unremarkable. No mediastinal fluid collection. Lungs/Pleura: Right upper and right lower lobe patchy and streaky ground-glass opacities relatively similar to prior CT most consistent with pneumonia, viral or atypical etiology including COVID-19. Clinical correlation and continued follow-up recommended. Minimal left upper lobe hazy density may represent atelectasis or infiltrate. There is no pleural effusion or pneumothorax. The central  airways are patent. Upper Abdomen: Cholecystectomy. Musculoskeletal: Degenerative changes of the spine. Partially visualized lower thoracic spine Harrington rods. No acute osseous pathology. Review of the MIP images confirms the above findings. IMPRESSION: 1. No pulmonary artery embolus identified. 2. Right lung opacity similar to prior CT most consistent with atypical pneumonia, likely COVID-19. Clinical correlation and continued follow-up recommended. Electronically Signed   By: 01/11/2020 M.D.   On: 01/15/2020 21:04   DG Chest Portable 1 View  Result Date:  01/15/2020 CLINICAL DATA:  Chest pain and shortness of breath EXAM: PORTABLE CHEST 1 VIEW COMPARISON:  01/09/2020 FINDINGS: Heart and mediastinal shadows remain normal. Patchy pulmonary infiltrates previously seen in the right lung have improved. No worsening or new finding. No effusion. IMPRESSION: Improving patchy infiltrates in the right lung. Electronically Signed   By: Paulina Fusi M.D.   On: 01/15/2020 14:55    EKG: Independently reviewed.  Normal sinus rhythm.  Nonspecific changes in lead V2 V3.  Cardiology was notified.  Assessment/Plan Principal Problem:   Chest pain Active Problems:   HTN (hypertension), benign   Asthma, intermittent, uncomplicated   ARF (acute renal failure) (HCC)    1. Chest pain -we will cycle cardiac markers keep patient on aspirin.  Cardiology has been notified. 2. Hypertension presently was initially hypotensive at presentation but will hold antihypertensives and as needed IV hydralazine for now. 3. Acute renal failure could be from poor oral intake.  Hold diuretic and ACE inhibitor.  Gently hydrate. 4. Recent COVID-19 infection presently continuing on Decadron and Omnicef. 5. History of sleep apnea.   DVT prophylaxis: Lovenox. Code Status: Full code. Family Communication: Discussed with patient. Disposition Plan: Home. Consults called: Cardiology. Admission status: Observation.   Eduard Clos MD [facility] Pager [phone].  If 7PM-7AM, please contact night-coverage [email or web address] Password [email or web address]  01/15/2020, 11:25 PM   \

## 2020-01-15 NOTE — ED Provider Notes (Signed)
[facility] EMERGENCY DEPARTMENT Provider Note   CSN: [id number] Arrival date & time: 01/15/20  1431     History Chief Complaint  Patient presents with  . Chest Pain    Jody Brewer is a 69 y.o. female.  HPI    Presents after episode of chest pain.  Currently she has no pain, she does have some ongoing dyspnea. She arrives via EMS and history provided by the patient herself and EMS providers. Patient notes that she received her last planned dose of remdesivir following Covid infection earlier today. Later in the afternoon, just prior to calling EMS she was getting ready, performing innocuous activity when she felt sudden onset severe chest pressure, subsequently diaphoresis, weakness.  Symptoms improved in route, with meds provided by EMS providers. Patient denies history of cardiac disease, states that she is generally well, generally active. She does, however, note that she had a recent Covid infection, requiring hospitalization due to persistent fever.  She was discharged last week.     Past Medical History:  Diagnosis Date  . Asthma   . Hypertension     Patient Active Problem List   Diagnosis Date Noted  . SOB (shortness of breath)   . COVID-19 12/27/2019  . HTN (hypertension), benign 12/27/2019  . Asthma, intermittent, uncomplicated 82/95/6213  . Pneumonia due to COVID-19 virus 12/27/2019    Past Surgical History:  Procedure Laterality Date  . ABDOMINAL HYSTERECTOMY    . APPENDECTOMY    . CHOLECYSTECTOMY    . OOPHORECTOMY     1 ovary removed     OB History   No obstetric history on file.     Family History  Problem Relation Age of Onset  . Emphysema Mother   . Parkinson's disease Father   . Dementia Father   . Hypertension Father     Social History   Tobacco Use  . Smoking status: Never Smoker  . Smokeless tobacco: Never Used  Substance Use Topics  . Alcohol use: Not Currently  . Drug use: Not Currently    Home  Medications Prior to Admission medications   Medication Sig Start Date End Date Taking? Authorizing Provider  albuterol (VENTOLIN HFA) 108 (90 Base) MCG/ACT inhaler Inhale 1 puff into the lungs every 6 (six) hours as needed for wheezing or shortness of breath.    [provider]  cefdinir (OMNICEF) 300 MG capsule Take 1 capsule (300 mg total) by mouth every 12 (twelve) hours for 6 days. 01/11/20 01/17/20  Fritzi Mandes, MD  cetirizine (ZYRTEC) 10 MG tablet Take 10 mg by mouth daily.    [provider]  chlorpheniramine-HYDROcodone (TUSSIONEX) 10-8 MG/5ML SUER Take 5 mLs by mouth every 12 (twelve) hours as needed for cough. 01/11/20   Fritzi Mandes, MD  cholecalciferol (VITAMIN D3) 25 MCG (1000 UT) tablet Take 1,000 Units by mouth 2 (two) times daily.    [provider]  dexamethasone (DECADRON) 6 MG tablet Take 1 tablet (6 mg total) by mouth daily. 01/11/20   Fritzi Mandes, MD  estradiol (ESTRACE) 0.5 MG tablet Take 0.5 mg by mouth daily. 10/15/19   [provider]  guaiFENesin-dextromethorphan (ROBITUSSIN DM) 100-10 MG/5ML syrup Take 10 mLs by mouth every 4 (four) hours as needed for cough. 01/11/20   Fritzi Mandes, MD  montelukast (SINGULAIR) 10 MG tablet Take 10 mg by mouth daily. 10/15/19   [provider]  Multiple Vitamin (MULTI-VITAMIN) tablet Take 1 tablet by mouth daily.    [provider]  nortriptyline (PAMELOR) 25 MG capsule Take 25 mg by mouth at bedtime. 11/26/19   [provider]  olmesartan-hydrochlorothiazide (BENICAR HCT) 20-12.5 MG tablet Take 1 tablet by mouth daily. 11/17/19   [provider]  pantoprazole (PROTONIX) 40 MG tablet Take 40 mg by mouth 2 (two) times daily. 10/15/19   [provider]  venlafaxine XR (EFFEXOR-XR) 75 MG 24 hr capsule Take 75 mg by mouth daily. 10/15/19   [provider]  vitamin E 400 UNIT capsule Take 400 Units by mouth daily.    [provider]    Allergies      Patient has no known allergies.  Review of Systems   Review of Systems  Constitutional:       Per HPI, otherwise negative  HENT:       Per HPI, otherwise negative  Respiratory:       Per HPI, otherwise negative  Cardiovascular:       Per HPI, otherwise negative  Gastrointestinal: Negative for vomiting.  Endocrine:       Negative aside from HPI  Genitourinary:       Neg aside from HPI   Musculoskeletal:       Per HPI, otherwise negative  Skin: Negative.   Neurological: Negative for syncope.    Physical Exam Updated Vital Signs BP 122/61 (BP Location: Right Arm)   Pulse 65   Temp 97.7 F (36.5 C) (Oral)   Resp 16   SpO2 99%   Physical Exam Vitals and nursing note reviewed.  Constitutional:      General: She is not in acute distress.    Appearance: She is well-developed.  HENT:     Head: Normocephalic and atraumatic.  Eyes:     Conjunctiva/sclera: Conjunctivae normal.  Cardiovascular:     Rate and Rhythm: Normal rate and regular rhythm.  Pulmonary:     Effort: Pulmonary effort is normal. Tachypnea present.     Breath sounds: Decreased breath sounds present.  Abdominal:     General: There is no distension.  Skin:    General: Skin is warm and dry.  Neurological:     Mental Status: She is alert and oriented to person, place, and time.     Cranial Nerves: No cranial nerve deficit.     ED Results / Procedures / Treatments   Labs (all labs ordered are listed, but only abnormal results are displayed) Labs Reviewed  COMPREHENSIVE METABOLIC PANEL  MAGNESIUM  BRAIN NATRIURETIC PEPTIDE  CBC WITH DIFFERENTIAL/PLATELET  PROTIME-INR  CBG MONITORING, ED  TROPONIN I (HIGH SENSITIVITY)    EMS has multiple rhythm strips.  Initial findings notable for precordial ST elevations, without depressions.  These were persistent for multiple repeat studies until the last rhythm strip performed just prior to ED arrival, which was unremarkable.  Initial cardiac rhythm here  sinus rhythm, rate 70, unremarkable on monitor.  EKG EKG Interpretation  Date/Time:  Tuesday January 15 2020 14:46:00 EST Ventricular Rate:  66 PR Interval:    QRS Duration: 108 QT Interval:  431 QTC Calculation: 452 R Axis:   59 Text Interpretation: Sinus rhythm Artifact No significant change since last tracing unremarkable ECG Confirmed by Gerhard Munch [phone]) on 01/15/2020 2:56:00 PM   Radiology No results found.  Procedures Procedures (including critical care time)  Medications Ordered in ED Medications  aspirin chewable tablet 324 mg (has no administration in time range)    ED Course  I have reviewed the triage vital signs and the  nursing notes.  Pertinent labs & imaging results that were available during my care of the patient were reviewed by me and considered in my medical decision making (see chart for details). 4:20 PM Initial labs reviewed with patient.  Initial troponin normal, somewhat reassuring, but given the patient's complaint of severe pain just prior to ED transport, she require serial troponin, likely admission given episode of chest pain without any recent evaluation, with consideration of spasm versus unstable angina.  Dr. Pilar Plate is aware of the patient and will follow her course.   MDM Rules/Calculators/A&P                     This adult female now recovering from recent Covid infection presents after episode of severe sudden onset chest pressure with diaphoresis, weakness. Patient has no recent cardiac evaluation, but denies any history of similar events.  Patient's EMS rhythm strips were notable for elevations in the anterior leads, though these have returned to normal prior to ED arrival. Patient's initial studies were reassuring, but given concern for unstable angina versus spasm, she requires additional monitoring, management, likely admission. Final Clinical Impression(s) / ED Diagnoses Final diagnoses:  Chest pain, unspecified type  Atypical  chest pain     Gerhard Munch, MD 01/15/20 1625

## 2020-01-15 NOTE — ED Provider Notes (Signed)
  Provider Note MRN:  [id number]  Arrival date & time: 01/15/20    ED Course and Medical Decision Making  Assumed care from Dr. Jeraldine Loots at shift change.  Concerning chest pain story, typical, with rhythm strips from EMS demonstrating some anterior elevations of the ST segment, now resolved.  First troponin negative, awaiting second troponin to determine admission to cardiology versus hospitalist.  10:27 PM update: During patient's ED stay she had return of severe chest pressure, radiation to the back, accompanied by shortness of breath.  Given patient's recent hospitalization and COVID-19, there was concern for possible pulmonary embolism, was also considering dissection given the radiation to the back.  CTA was obtained and was unremarkable, normal aorta, no PE.  Patient's repeat EKG again did not show any signs of ischemia.  The rhythm strips were reviewed by cards fellow, who felt that she was appropriate for hospitalist admission for standard chest pain rule out.  Admitted to hospital service for further care.  Procedures  Final Clinical Impressions(s) / ED Diagnoses     ICD-10-CM   1. Chest pain, unspecified type  R07.9     ED Discharge Orders    None      Discharge Instructions   None     Elmer Sow. Pilar Plate, MD [facility] Emergency Medicine [facility] [email or web address]    Sabas Sous, MD 01/15/20 2228

## 2020-01-15 NOTE — ED Triage Notes (Signed)
To ED via [facility] from home - crushing chest pain, after walking down the stairs- sat on couch, became very diaphoretic, midchest radiating to back and up to neck, near syncopal while at home--  COVID 12/27/19 +  --  inpatient [facility] , until 5 days ago, has been to infusion clinic at [facility]- finished the Remdesivir and Methylprednisolone infusion clinic yesterday.  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

## 2020-01-16 ENCOUNTER — Observation Stay (HOSPITAL_BASED_OUTPATIENT_CLINIC_OR_DEPARTMENT_OTHER): Payer: Medicare Other

## 2020-01-16 DIAGNOSIS — R079 Chest pain, unspecified: Secondary | ICD-10-CM | POA: Diagnosis present

## 2020-01-16 DIAGNOSIS — J1282 Pneumonia due to coronavirus disease 2019: Secondary | ICD-10-CM

## 2020-01-16 DIAGNOSIS — U071 COVID-19: Secondary | ICD-10-CM

## 2020-01-16 DIAGNOSIS — R072 Precordial pain: Secondary | ICD-10-CM | POA: Diagnosis not present

## 2020-01-16 DIAGNOSIS — R0789 Other chest pain: Secondary | ICD-10-CM | POA: Diagnosis present

## 2020-01-16 DIAGNOSIS — N179 Acute kidney failure, unspecified: Secondary | ICD-10-CM | POA: Diagnosis not present

## 2020-01-16 DIAGNOSIS — E860 Dehydration: Secondary | ICD-10-CM | POA: Diagnosis not present

## 2020-01-16 DIAGNOSIS — Z8249 Family history of ischemic heart disease and other diseases of the circulatory system: Secondary | ICD-10-CM | POA: Diagnosis not present

## 2020-01-16 DIAGNOSIS — Z79899 Other long term (current) drug therapy: Secondary | ICD-10-CM | POA: Diagnosis not present

## 2020-01-16 DIAGNOSIS — Z7952 Long term (current) use of systemic steroids: Secondary | ICD-10-CM | POA: Diagnosis not present

## 2020-01-16 DIAGNOSIS — J4520 Mild intermittent asthma, uncomplicated: Secondary | ICD-10-CM | POA: Diagnosis not present

## 2020-01-16 DIAGNOSIS — I2510 Atherosclerotic heart disease of native coronary artery without angina pectoris: Secondary | ICD-10-CM | POA: Diagnosis not present

## 2020-01-16 DIAGNOSIS — G4733 Obstructive sleep apnea (adult) (pediatric): Secondary | ICD-10-CM | POA: Diagnosis not present

## 2020-01-16 DIAGNOSIS — M489 Spondylopathy, unspecified: Secondary | ICD-10-CM | POA: Diagnosis not present

## 2020-01-16 DIAGNOSIS — I10 Essential (primary) hypertension: Secondary | ICD-10-CM | POA: Diagnosis not present

## 2020-01-16 LAB — CBC
HCT: 36.4 % (ref 36.0–46.0)
HCT: 36.8 % (ref 36.0–46.0)
Hemoglobin: 12.1 g/dL (ref 12.0–15.0)
Hemoglobin: 12.1 g/dL (ref 12.0–15.0)
MCH: 30.6 pg (ref 26.0–34.0)
MCH: 30.7 pg (ref 26.0–34.0)
MCHC: 32.9 g/dL (ref 30.0–36.0)
MCHC: 33.2 g/dL (ref 30.0–36.0)
MCV: 92.4 fL (ref 80.0–100.0)
MCV: 92.9 fL (ref 80.0–100.0)
Platelets: 321 10*3/uL (ref 150–400)
Platelets: 332 10*3/uL (ref 150–400)
RBC: 3.94 MIL/uL (ref 3.87–5.11)
RBC: 3.96 MIL/uL (ref 3.87–5.11)
RDW: 13.1 % (ref 11.5–15.5)
RDW: 13.2 % (ref 11.5–15.5)
WBC: 7.6 10*3/uL (ref 4.0–10.5)
WBC: 7.8 10*3/uL (ref 4.0–10.5)
nRBC: 0 % (ref 0.0–0.2)
nRBC: 0 % (ref 0.0–0.2)

## 2020-01-16 LAB — CREATININE, SERUM
Creatinine, Ser: 1.21 mg/dL — ABNORMAL HIGH (ref 0.44–1.00)
GFR calc Af Amer: 54 mL/min — ABNORMAL LOW (ref >60)
GFR calc non Af Amer: 46 mL/min — ABNORMAL LOW (ref >60)

## 2020-01-16 LAB — BASIC METABOLIC PANEL
Anion gap: 9 (ref 5–15)
BUN: 31 mg/dL — ABNORMAL HIGH (ref 8–23)
CO2: 23 mmol/L (ref 22–32)
Calcium: 8.5 mg/dL — ABNORMAL LOW (ref 8.9–10.3)
Chloride: 103 mmol/L (ref 98–111)
Creatinine, Ser: 1.21 mg/dL — ABNORMAL HIGH (ref 0.44–1.00)
GFR calc Af Amer: 54 mL/min — ABNORMAL LOW (ref >60)
GFR calc non Af Amer: 46 mL/min — ABNORMAL LOW (ref >60)
Glucose, Bld: 97 mg/dL (ref 70–99)
Potassium: 3.9 mmol/L (ref 3.5–5.1)
Sodium: 135 mmol/L (ref 135–145)

## 2020-01-16 LAB — TROPONIN I (HIGH SENSITIVITY)
Troponin I (High Sensitivity): 31 ng/L — ABNORMAL HIGH (ref <18)
Troponin I (High Sensitivity): 25 ng/L — ABNORMAL HIGH (ref <18)
Troponin I (High Sensitivity): 29 ng/L — ABNORMAL HIGH (ref <18)
Troponin I (High Sensitivity): 33 ng/L — ABNORMAL HIGH (ref <18)

## 2020-01-16 LAB — HIV ANTIBODY (ROUTINE TESTING W REFLEX): HIV Screen 4th Generation wRfx: NONREACTIVE

## 2020-01-16 LAB — SARS CORONAVIRUS 2 (TAT 6-24 HRS): SARS Coronavirus 2: POSITIVE — AB

## 2020-01-16 LAB — GLUCOSE, CAPILLARY: Glucose-Capillary: 130 mg/dL — ABNORMAL HIGH (ref 70–99)

## 2020-01-16 MED ORDER — MORPHINE SULFATE (PF) 2 MG/ML IV SOLN
2.0000 mg | Freq: Once | INTRAVENOUS | Status: AC
  Administered 2020-01-16: 2 mg via INTRAVENOUS
  Filled 2020-01-16: qty 1

## 2020-01-16 MED ORDER — CEFDINIR 300 MG PO CAPS
300.0000 mg | ORAL_CAPSULE | Freq: Two times a day (BID) | ORAL | Status: DC
  Administered 2020-01-16 – 2020-01-17 (×3): 300 mg via ORAL
  Filled 2020-01-16 (×4): qty 1

## 2020-01-16 MED ORDER — SODIUM CHLORIDE 0.9 % IV SOLN: INTRAVENOUS | Status: DC

## 2020-01-16 MED ORDER — ASPIRIN 325 MG PO TABS
325.0000 mg | ORAL_TABLET | Freq: Every day | ORAL | Status: DC
  Administered 2020-01-16 – 2020-01-17 (×2): 325 mg via ORAL
  Filled 2020-01-16 (×2): qty 1

## 2020-01-16 MED ORDER — NITROGLYCERIN 0.4 MG SL SUBL
0.4000 mg | SUBLINGUAL_TABLET | SUBLINGUAL | Status: DC | PRN
  Administered 2020-01-16 (×2): 0.4 mg via SUBLINGUAL
  Filled 2020-01-16: qty 1

## 2020-01-16 NOTE — Evaluation (Signed)
Physical Therapy Evaluation Patient Details Name: Jody Brewer MRN: [id number] DOB: 08-Mar-1951 Today's Date: 01/16/2020   History of Present Illness  69 y.o. female with history of hypertension sleep apnea discharge recently after being treated for COVID-19 infection presently on Decadron and Omnicef still has 5 more days of Decadron left presents to the ER after patient started having chest pain.  Patient states this afternoon after walking down the stairs at her house patient started having substernal pressure-like symptom with some nausea and diaphoresis.  Clinical Impression  Pt presents to PT with deficits in endurance and gait, present since COVID diagnosis on 12/31. While pt is not at her functional baseline she is able to ambulate household distances independently and reports no concerns for her mobility status at this time. Pt denies and symptoms during session, with no chest pain with exertion. Pt requires no further acute PT services and is encouraged to ambulate multiple times per day to maintain and continue progressing back to her PLOF. Acute PT signing off 01/16/2020.    Follow Up Recommendations No PT follow up    Equipment Recommendations  None recommended by PT    Recommendations for Other Services       Precautions / Restrictions Precautions Precautions: None Restrictions Weight Bearing Restrictions: No      Mobility  Bed Mobility Overal bed mobility: Modified Independent             General bed mobility comments: increased time  Transfers Overall transfer level: Independent                  Ambulation/Gait Ambulation/Gait assistance: Independent Gait Distance (Feet): 100 Feet Assistive device: None Gait Pattern/deviations: Step-through pattern Gait velocity: functional Gait velocity interpretation: 1.31 - 2.62 ft/sec, indicative of limited community ambulator General Gait Details: pt with steady step through gait, slightly widened BOS  with reduced gait speed compared to baseline  Stairs Stairs: (unable to leave pt room due to COVID precautions)          Wheelchair Mobility    Modified Rankin (Stroke Patients Only)       Balance Overall balance assessment: Independent                                           Pertinent Vitals/Pain Pain Assessment: No/denies pain    Home Living Family/patient expects to be discharged to:: Private residence Living Arrangements: Children;Other relatives Available Help at Discharge: Family;Available 24 hours/day Type of Home: House Home Access: Stairs to enter Entrance Stairs-Rails: None Entrance Stairs-Number of Steps: 3 Home Layout: Two level Home Equipment: None Additional Comments: pt living at daughters since last admission    Prior Function Level of Independence: Independent         Comments: pt progressing with exercise in home since last admission     Hand Dominance        Extremity/Trunk Assessment   Upper Extremity Assessment Upper Extremity Assessment: Overall WFL for tasks assessed    Lower Extremity Assessment Lower Extremity Assessment: Overall WFL for tasks assessed    Cervical / Trunk Assessment Cervical / Trunk Assessment: Normal  Communication   Communication: No difficulties  Cognition Arousal/Alertness: Awake/alert Behavior During Therapy: WFL for tasks assessed/performed Overall Cognitive Status: Within Functional Limits for tasks assessed  General Comments General comments (skin integrity, edema, etc.): pt asymptomatic during session    Exercises     Assessment/Plan    PT Assessment Patent does not need any further PT services  PT Problem List         PT Treatment Interventions      PT Goals (Current goals can be found in the Care Plan section)       Frequency     Barriers to discharge        Co-evaluation                AM-PAC PT "6 Clicks" Mobility  Outcome Measure Help needed turning from your back to your side while in a flat bed without using bedrails?: None Help needed moving from lying on your back to sitting on the side of a flat bed without using bedrails?: None Help needed moving to and from a bed to a chair (including a wheelchair)?: None Help needed standing up from a chair using your arms (e.g., wheelchair or bedside chair)?: None Help needed to walk in hospital room?: None Help needed climbing 3-5 steps with a railing? : None 6 Click Score: 24    End of Session Equipment Utilized During Treatment: (none) Activity Tolerance: Patient tolerated treatment well Patient left: in chair;with call bell/phone within reach Nurse Communication: Mobility status      Time: 7939-0300 PT Time Calculation (min) (ACUTE ONLY): 16 min   Charges:   PT Evaluation $PT Eval Low Complexity: 1 Low          Arlyss Gandy, PT, DPT Acute Rehabilitation Pager: [phone]   Arlyss Gandy 01/16/2020, 4:43 PM

## 2020-01-16 NOTE — Progress Notes (Signed)
[facility] PROGRESS NOTE  Jody Brewer DZH:[id number] DOB: [birth year] DOA: 01/15/2020 PCP: Danella Penton, MD  Assessment/Plan:  #1.  Chest pain.Atypical.  Patient discharged 5 days ago after 3-day hospitalization for Covid 19/pneumonia.  She reports she did well for a couple of days yesterday developed shortness of breath chest pain described as sharp located left anterior shooting to the back associated with diaphoresis and shortness of breath.  She has had decreased oral intake.  EMS was called she was found to be hypotensive.  Troponins 5>10>33>31>25>29.  Chest x-ray reveals improving patchy infiltrates in the right lung. EKG with SR but initially some ST elevations that resolved. Pain free this am.  Admitting note indicates rhythm strips reviewed by cardiology fellow during the night recommended hospitalist admission for standard chest pain rule out -supportive care -Monitor on telemetry -Await cardiology recommendations  #2.  Hypertension.  Reportedly patient was hypotensive on presentation.  Home medications include HCTZ and olmsartan.  She was provided with IV fluids and her blood pressure increased somewhat this morning is at the low end of normal -Continue to hold home meds -Gentle IV fluids as her oral intake is unreliable -Monitor closely  #3.  Acute kidney injury.  Likely related to decreased oral intake in the setting of recent illness.  Creatinine 1.2. -Gentle IV fluids -Hold nephrotoxins -Monitor urine output -Recheck in the morning  #4.  Pneumonia due to COVID-19 virus.  Patient discharged 5 days ago after a 3-day hospitalization for same.  Chest x-ray reveals improving patchy infiltrates on the right lung.  CT of the chest no pulmonary artery embolus identified, right lung opacity similar to prior CT most consistent with atypical pneumonia likely COVID-19.  Oxygen saturation level greater than 90% on room air.  She is afebrile.  He completed Remdisivir. She  remains on decedron. -continue decedron -monitor  Code Status: full Family Communication: patient Disposition Plan: home when ready   Consultants:  cardiology  Procedures:    Antibiotics:    HPI/Subjective: Lying in bed awake alert.  Reports chest pain resolved.  Complains of generalized weakness decreased appetite denies nausea abdominal pain  Objective: Vitals:   01/16/20 0657 01/16/20 0800  BP: 101/61 117/62  Pulse: 88 80  Resp:  12  Temp:  98.1 F (36.7 C)  SpO2:  100%   No intake or output data in the 24 hours ending 01/16/20 1012 Filed Weights   01/16/20 0624  Weight: 79.9 kg    Exam:   General: Awake alert appears ill but nontoxic  Cardiovascular: Regular rate and rhythm no murmur gallop or rub no lower extremity edema  Respiratory: Normal effort breath sounds are distant but clear.  I hear no crackles no wheezes  Abdomen: Nondistended soft positive bowel sounds throughout no guarding or rebounding  Musculoskeletal: Joints without swelling/erythema  Data Reviewed: Basic Metabolic Panel: Recent Labs  Lab 01/09/20 1838 01/11/20 0341 01/15/20 1455 01/16/20 0014 01/16/20 0233  NA 137 139 136  --  135  K 4.3 4.1 3.4*  --  3.9  CL 104 107 103  --  103  CO2 21* 25 20*  --  23  GLUCOSE 108* 96 103*  --  97  BUN 29* 30* 33*  --  31*  CREATININE 0.95 0.81 1.19* 1.21* 1.21*  CALCIUM 9.8 8.8* 9.2  --  8.5*  MG  --   --  2.4  --   --    Liver Function Tests: Recent Labs  Lab 01/09/20 1838  01/11/20 0341 01/15/20 1455  AST 30 24 20   ALT 44 37 32  ALKPHOS 73 64 63  BILITOT 0.9 1.0 1.4*  PROT 7.3 6.3* 6.6  ALBUMIN 3.8 3.2* 3.6   No results for input(s): LIPASE, AMYLASE in the last 168 hours. No results for input(s): AMMONIA in the last 168 hours. CBC: Recent Labs  Lab 01/09/20 1838 01/11/20 0341 01/15/20 1455 01/16/20 0014 01/16/20 0233  WBC 11.0* 10.6* 9.3 7.8 7.6  NEUTROABS 9.2* 7.5 6.2  --   --   HGB 13.4 11.0* 12.4 12.1 12.1   HCT 39.3 32.9* 36.8 36.8 36.4  MCV 89.1 89.6 91.3 92.9 92.4  PLT 520* 407* 355 332 321   Cardiac Enzymes: No results for input(s): CKTOTAL, CKMB, CKMBINDEX, TROPONINI in the last 168 hours. BNP (last 3 results) Recent Labs    01/15/20 1455  BNP 20.2    ProBNP (last 3 results) No results for input(s): PROBNP in the last 8760 hours.  CBG: Recent Labs  Lab 01/15/20 1520  GLUCAP 87    Recent Results (from the past 240 hour(s))  Blood Culture (routine x 2)     Status: None   Collection Time: 01/10/20 12:16 AM   Specimen: BLOOD  Result Value Ref Range Status   Specimen Description BLOOD RIGHT ANTECUBITAL  Final   Special Requests   Final    BOTTLES DRAWN AEROBIC AND ANAEROBIC Blood Culture results may not be optimal due to an excessive volume of blood received in culture bottles   Culture   Final    NO GROWTH 5 DAYS Performed at [facility], [address] [place], [place] [address]    Report Status 01/15/2020 FINAL  Final  Blood Culture (routine x 2)     Status: None   Collection Time: 01/10/20 12:16 AM   Specimen: BLOOD  Result Value Ref Range Status   Specimen Description BLOOD LEFT ANTECUBITAL  Final   Special Requests   Final    BOTTLES DRAWN AEROBIC AND ANAEROBIC Blood Culture adequate volume   Culture   Final    NO GROWTH 5 DAYS Performed at [facility], [address], [place], [place] [address]    Report Status 01/15/2020 FINAL  Final  SARS CORONAVIRUS 2 (TAT 6-24 HRS) Nasopharyngeal Nasopharyngeal Swab     Status: Abnormal   Collection Time: 01/15/20 11:21 PM   Specimen: Nasopharyngeal Swab  Result Value Ref Range Status   SARS Coronavirus 2 POSITIVE (A) NEGATIVE Final    Comment: RESULT CALLED TO, READ BACK BY AND VERIFIED WITH: J. CICHORSKI,RN 3500 01/16/2020 T. TYSOR (NOTE) SARS-CoV-2 target nucleic acids are DETECTED. The SARS-CoV-2 RNA is generally detectable in upper and lower respiratory specimens during the acute phase of  infection. Positive results are indicative of the presence of SARS-CoV-2 RNA. Clinical correlation with patient history and other diagnostic information is  necessary to determine patient infection status. Positive results do not rule out bacterial infection or co-infection with other viruses.  The expected result is Negative. Fact Sheet for Patients: SugarRoll.be Fact Sheet for Healthcare Providers: https://www.woods-mathews.com/ This test is not yet approved or cleared by the [place] FDA and  has been authorized for detection and/or diagnosis of SARS-CoV-2 by FDA under an Emergency Use Authorization (EUA). This EUA will remain  in effect (meaning this test can be used) fo r the duration of the COVID-19 declaration under Section 564(b)(1) of the Act, 21 U.S.C. section 360bbb-3(b)(1), unless the authorization is terminated or revoked sooner.  Performed at [facility], [address], [place], [place] [address]      Studies: CT ANGIO CHEST PE W OR WO CONTRAST  Result Date: 01/15/2020 CLINICAL DATA:  69 year old female with chest pain and shortness of breath. EXAM: CT ANGIOGRAPHY CHEST WITH CONTRAST TECHNIQUE: Multidetector CT imaging of the chest was performed using the standard protocol during bolus administration of intravenous contrast. Multiplanar CT image reconstructions and MIPs were obtained to evaluate the vascular anatomy. CONTRAST:  OMNIPAQUE IOHEXOL 350 MG/ML SOLN COMPARISON:  Chest CT dated 01/09/2020. FINDINGS: Cardiovascular: There is no cardiomegaly or pericardial effusion. The thoracic aorta is unremarkable. The origins of the great vessels of the aortic arch appear patent as visualized. No pulmonary artery embolus identified. Mediastinum/Nodes: There is no hilar or mediastinal adenopathy. There is a small hiatal hernia. The esophagus and the thyroid gland are grossly unremarkable. No mediastinal fluid collection.  Lungs/Pleura: Right upper and right lower lobe patchy and streaky ground-glass opacities relatively similar to prior CT most consistent with pneumonia, viral or atypical etiology including COVID-19. Clinical correlation and continued follow-up recommended. Minimal left upper lobe hazy density may represent atelectasis or infiltrate. There is no pleural effusion or pneumothorax. The central airways are patent. Upper Abdomen: Cholecystectomy. Musculoskeletal: Degenerative changes of the spine. Partially visualized lower thoracic spine Harrington rods. No acute osseous pathology. Review of the MIP images confirms the above findings. IMPRESSION: 1. No pulmonary artery embolus identified. 2. Right lung opacity similar to prior CT most consistent with atypical pneumonia, likely COVID-19. Clinical correlation and continued follow-up recommended. Electronically Signed   By: Elgie Collard M.D.   On: 01/15/2020 21:04   DG Chest Portable 1 View  Result Date: 01/15/2020 CLINICAL DATA:  Chest pain and shortness of breath EXAM: PORTABLE CHEST 1 VIEW COMPARISON:  01/09/2020 FINDINGS: Heart and mediastinal shadows remain normal. Patchy pulmonary infiltrates previously seen in the right lung have improved. No worsening or new finding. No effusion. IMPRESSION: Improving patchy infiltrates in the right lung. Electronically Signed   By: Paulina Fusi M.D.   On: 01/15/2020 14:55    Scheduled Meds: . ascorbic acid  500 mg Oral Daily  . aspirin  324 mg Oral Once  . aspirin  325 mg Oral Daily  . cefdinir  300 mg Oral Q12H  . cholecalciferol  1,000 Units Oral BID  . dexamethasone  6 mg Oral QPC lunch  . enoxaparin (LOVENOX) injection  40 mg Subcutaneous Daily  . montelukast  10 mg Oral QPM  . multivitamin with minerals  1 tablet Oral Daily  . nitroGLYCERIN  0.4 mg Sublingual Once  . nortriptyline  25 mg Oral QHS  . pantoprazole  40 mg Oral BID  . venlafaxine XR  75 mg Oral Q breakfast  . vitamin E  400 Units Oral  Daily  . zinc sulfate  220 mg Oral Daily   Continuous Infusions: . sodium chloride 75 mL/hr at 01/16/20 0032  . sodium chloride      Principal Problem:   Chest pain Active Problems:   HTN (hypertension), benign   ARF (acute renal failure) (HCC)   Asthma, intermittent, uncomplicated   Pneumonia due to COVID-19 virus    Time spent: 45 minutes    [facility] M NP  [facility]  If 7PM-7AM, please contact night-coverage at [email or web address], password [facility] 01/16/2020, 10:12 AM  LOS: 0 days

## 2020-01-16 NOTE — ED Notes (Signed)
Pt c/o severe back and chest pain. Dr. Toniann Fail paged and notified

## 2020-01-16 NOTE — Consult Note (Addendum)
Cardiology Consultation:   Jody Brewer MRN: [id number]; DOB: 09-18-1951  Admit date: 01/15/2020 Date of Consult: 01/16/2020  Primary Care Provider: Danella Penton, MD Primary Cardiologist: New to [facility]  Primary Electrophysiologist:  None    Jody Profile:   Jody Brewer is a 69 y.o. 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 Jacqulyn Bath.  History of Present Illness:   Jody Brewer is a 69 year old female with the above history. Jody underwent an ETT and Echo on 10/24/2018 for further evaluation of chest pain. Stress test was equivocal due to having to stop early due to symptoms of shortness of breath and fatigue. Echocardiogram showed normal LV function and no significant valvular disease. At follow-up visit in 10/2018 with her PCP (Dr. Hyacinth Meeker), she continued to report ongoing fatigue, chest pressure, and shortness of breath as well as palpitations. Therefore, coronary CT and Holter Monitor were ordered. Coronary CT on 12/05/2018 showed 50-70% stenosis just beyond the take off of OM1 and less than <30% stenosis of proximal LAD. FFR was sent and showed no flow-limiting stenosis. Holter monitor showed normal sinus rhythm with rare PVCs and PACs.   Jody was diagnosed with COVID-19 on 12/27/2019 and was admitted at [facility] from 01/09/2020 to 01/11/2020 for treatment of acute respiratory failure secondary to COVID pneumonia. She was treated with antibiotics, steroids, and Remdisivir with plans to continue infusions at [facility] outpatient center.  Jody presented to the ED yesterday via EMS for further evaluation of crushing chest pain with associated diaphoresis and weakness. In the ED, EKG showed normal sinus rhythm with what looks like early repolarization changes in  V2-V3 but no acute ST/T changes. High-sensitivity troponin 5 >> 10 >> 33 >> 31 >> 25 >> 29. BNP normal. Chest x-ray showed improved patchy infiltrates in the right lung. Chest CTA negative for PE. WBC 9.3, Hgb 12.4, Plts 355. Na 136, K 3.4, BUN 33, Cr 1.19. Magnesium 2.4. Jody was admitted for further evaluation and Cardiology consulted. Echo today showed LVEF 65-70% with grade 1 diastolic dysfunction and normal wall motion.   Given COVID positive status, I called into Jody's room to obtain history. Jody reports sudden onset of severe crushing chest pain that radiated to her back and both shoulder yesterday after walking downstairs and feeling more short of breath than normal. She had associated diaphoresis (states she was pouring sweat), nausea (but no vomiting), lightheadedness, dizziness, and near syncope (but no syncope). Her daughter gave her 4 baby Aspirins but when pain did not improve, they called 911. When EMS arrived, Jody states they were having trouble getting a BP so Nitro was not given. Pain gradually improved but in route to the hospital it came back in "full force" and lasted for about 30 minutes to 1 hour. Jody states nothing seems to make it better or worse. She has never had pain like this before. No orthopnea, PND, or lower extremity edema. No fevers since receiving IV antibiotics during recent hospitalization. Jody reports since has had several more episodes of pain since being admitted with the last one lasting being at 6am this morning. Pain does not seem to be positional in nature.   Jody denies history of tobacco use and has no family history of heart disease.  Heart Pathway Score:  Past Medical History:  Diagnosis Date  . Asthma   . COVID-19   . Hypertension     Past Surgical History:  Procedure Laterality Date  . ABDOMINAL HYSTERECTOMY    . APPENDECTOMY    . CHOLECYSTECTOMY    . OOPHORECTOMY     1 ovary removed     Home Medications:  Prior to  Admission medications   Medication Sig Start Date End Date Taking? Authorizing Provider  albuterol (VENTOLIN HFA) 108 (90 Base) MCG/ACT inhaler Inhale 2 puffs into the lungs See admin instructions. Inhale 2 puffs into the lungs every three to four hours as needed for shortness of breath or wheezing   Yes [provider]  Ascorbic Acid (VITAMIN C) 500 MG CHEW Chew 500 mg by mouth daily.   Yes [provider]  cefdinir (OMNICEF) 300 MG capsule Take 1 capsule (300 mg total) by mouth every 12 (twelve) hours for 6 days. 01/11/20 01/17/20 Yes Enedina FinnerPatel, Sona, MD  cetirizine (ZYRTEC) 10 MG tablet Take 10 mg by mouth daily.   Yes [provider]  chlorpheniramine-HYDROcodone (TUSSIONEX) 10-8 MG/5ML SUER Take 5 mLs by mouth every 12 (twelve) hours as needed for cough. 01/11/20  Yes Enedina FinnerPatel, Sona, MD  cholecalciferol (VITAMIN D3) 25 MCG (1000 UT) tablet Take 1,000 Units by mouth 2 (two) times daily.   Yes [provider]  dexamethasone (DECADRON) 6 MG tablet Take 1 tablet (6 mg total) by mouth daily. Jody taking differently: Take 6 mg by mouth daily after lunch.  01/11/20  Yes Enedina FinnerPatel, Sona, MD  estradiol (ESTRACE) 0.5 MG tablet Take 0.5 mg by mouth daily. 10/15/19  Yes [provider]  montelukast (SINGULAIR) 10 MG tablet Take 10 mg by mouth every evening.  10/15/19  Yes [provider]  Multiple Vitamin (MULTI-VITAMIN) tablet Take 1 tablet by mouth daily.   Yes [provider]  nortriptyline (PAMELOR) 25 MG capsule Take 25 mg by mouth at bedtime. 11/26/19  Yes [provider]  olmesartan-hydrochlorothiazide (BENICAR HCT) 20-12.5 MG tablet Take 1 tablet by mouth daily. 11/17/19  Yes [provider]  pantoprazole (PROTONIX) 40 MG tablet Take 40 mg by mouth 2 (two) times daily. 10/15/19  Yes [provider]  venlafaxine XR (EFFEXOR-XR) 75 MG 24 hr capsule Take 75 mg by mouth daily. 10/15/19  Yes [provider]  vitamin E  400 UNIT capsule Take 400 Units by mouth daily.   Yes [provider]  zinc gluconate 50 MG tablet Take 50 mg by mouth daily.   Yes [provider]  guaiFENesin-dextromethorphan (ROBITUSSIN DM) 100-10 MG/5ML syrup Take 10 mLs by mouth every 4 (four) hours as needed for cough. Jody not taking: Reported on 01/15/2020 01/11/20   Enedina FinnerPatel, Sona, MD    Inpatient Medications: Scheduled Meds: . ascorbic acid  500 mg Oral Daily  . aspirin  324 mg Oral Once  . aspirin  325 mg Oral Daily  . cefdinir  300 mg Oral Q12H  . cholecalciferol  1,000 Units Oral BID  . dexamethasone  6 mg Oral QPC lunch  . enoxaparin (LOVENOX) injection  40 mg Subcutaneous Daily  . montelukast  10 mg Oral QPM  . multivitamin with minerals  1 tablet Oral Daily  . nitroGLYCERIN  0.4 mg Sublingual Once  . nortriptyline  25 mg Oral QHS  . pantoprazole  40 mg Oral BID  . venlafaxine XR  75 mg Oral Q breakfast  . vitamin E  400 Units Oral Daily  .  zinc sulfate  220 mg Oral Daily   Continuous Infusions: . sodium chloride 75 mL/hr at 01/16/20 0032  . sodium chloride     PRN Meds: acetaminophen **OR** acetaminophen, albuterol, hydrALAZINE, nitroGLYCERIN, ondansetron **OR** ondansetron (ZOFRAN) IV  Allergies:   No Known Allergies  Social History:   Social History   Socioeconomic History  . Marital status: Married    Spouse name: Not on file  . Number of children: Not on file  . Years of education: Not on file  . Highest education level: Not on file  Occupational History  . Not on file  Tobacco Use  . Smoking status: Never Smoker  . Smokeless tobacco: Never Used  Substance and Sexual Activity  . Alcohol use: Not Currently  . Drug use: Not Currently  . Sexual activity: Not on file  Other Topics Concern  . Not on file  Social History Narrative  . Not on file   Social Determinants of Health   Financial Resource Strain:   . Difficulty of Paying Living Expenses: Not on file  Food Insecurity:    . Worried About Programme researcher, broadcasting/film/video in the Last Year: Not on file  . Ran Out of Food in the Last Year: Not on file  Transportation Needs:   . Lack of Transportation (Medical): Not on file  . Lack of Transportation (Non-Medical): Not on file  Physical Activity:   . Days of Exercise per Week: Not on file  . Minutes of Exercise per Session: Not on file  Stress:   . Feeling of Stress : Not on file  Social Connections:   . Frequency of Communication with Friends and Family: Not on file  . Frequency of Social Gatherings with Friends and Family: Not on file  . Attends Religious Services: Not on file  . Active Member of Clubs or Organizations: Not on file  . Attends Banker Meetings: Not on file  . Marital Status: Not on file  Intimate Partner Violence:   . Fear of Current or Ex-Partner: Not on file  . Emotionally Abused: Not on file  . Physically Abused: Not on file  . Sexually Abused: Not on file    Family History:   Family History  Problem Relation Age of Onset  . Emphysema Mother   . Parkinson's disease Father   . Dementia Father   . Hypertension Father      ROS:  Please see the history of present illness.  All other ROS reviewed and negative.     Physical Exam/Data:   Vitals:   01/16/20 0624 01/16/20 0657 01/16/20 0800 01/16/20 1249  BP: (!) 146/75 101/61 117/62 (!) 104/57  Pulse: 67 88 80 84  Resp:   12 14  Temp: 98.2 F (36.8 C)  98.1 F (36.7 C) 98.5 F (36.9 C)  TempSrc: Oral  Oral Oral  SpO2: 98%  100% 93%  Weight: 79.9 kg     Height: 5\' 7"  (1.702 m)       Intake/Output Summary (Last 24 hours) at 01/16/2020 1544 Last data filed at 01/16/2020 0800 Gross per 24 hour  Intake 240 ml  Output --  Net 240 ml   Last 3 Weights 01/16/2020 01/10/2020 01/09/2020  Weight (lbs) 176 lb 2.4 oz 183 lb 1.6 oz 180 lb  Weight (kg) 79.9 kg 83.054 kg 81.647 kg     Body mass index is 27.59 kg/m.   Given COVID positive status, called into Jody' s room so exam  limited.  General: No acute distress.  Cardiac: Normal rate and rhythm per telemetry. Lungs: Able to talk in complete sentences without any noticeable shortness of breath. No audible wheezing. No cough.  Neuro: Alert and oriented x3. Answers questions appropriately. Psych:  Normal affect.  MD to follow with full exam.    EKG:  The following EKGs were personally reviewed: - EKG from 01/15/2020 at 14:46pm demonstrates: Normal sinus rhythm, rate 66 bpm, with early repolarization changes and non-specific IVCD. No acute ST/ T changes.  - EKG form 01/16/2020 at 3:43pm demonstrates: Normal sinus rhythm, rate 70 bpm, with elevated J in anterior leads consistent with early repolarization changes and non-specific IVCD. No acute ST/T changes.  Telemetry:  Telemetry was personally reviewed and demonstrates: Normal sinus rhythm with rates in the 70's to 80's.  Relevant CV Studies:  Coronary CT 12/05/2018 ([facility]): Findings: Coronary Arteries:  - There is right dominance of the coronary arterial circulation. The coronary artery ostia are in abnormal location. - Left Main: No significant atherosclerotic disease or stenosis. - Left Anterior Descending: Minimal calcified and noncalcified plaque in the proximal LAD causing less than 30% stenosis. - Diagonals: proximal D1, D2, and D3 are patent - Left Circumflex: Calcified and noncalcified plaque in the left circumflex with an area of mixed calcified and noncalcified plaque in the mid left circumflex causing at least 50-70% stenosis just beyond the take off of OM1. LCX terminating in an obtuse marginal along the left lateral ventricular wall. - Obtuse Marginals: OM1 patent. - Right Coronary Artery: Minimal noncalcified plaque in the right coronary artery without significant stenosis. _______________  Echocardiogram 01/16/2020: Impressions: 1. Left ventricular ejection fraction, by visual estimation, is 65 to 70%. The left ventricle has hyperdynamic  function. There is no left ventricular hypertrophy.  2. Left ventricular diastolic parameters are consistent with Grade I diastolic dysfunction (impaired relaxation).  3. The left ventricle has no regional wall motion abnormalities.  4. Global right ventricle has normal systolic function.The right ventricular size is normal. No increase in right ventricular wall thickness.  5. Left atrial size was normal.  6. Right atrial size was normal.  7. The mitral valve is normal in structure. No evidence of mitral valve regurgitation. No evidence of mitral stenosis.  8. The tricuspid valve is normal in structure.  9. The tricuspid valve is normal in structure. Tricuspid valve regurgitation is trivial. 10. The aortic valve is normal in structure. Aortic valve regurgitation is not visualized. No evidence of aortic valve sclerosis or stenosis. 11. The pulmonic valve was normal in structure. Pulmonic valve regurgitation is not visualized. 12. The inferior vena cava is normal in size with greater than 50% respiratory variability, suggesting right atrial pressure of 3 mmHg.  Laboratory Data:  High Sensitivity Troponin:   Recent Labs  Lab 01/15/20 1720 01/16/20 0014 01/16/20 0233 01/16/20 0358 01/16/20 0705  TROPONINIHS 10 33* 31* 25* 29*     Chemistry Recent Labs  Lab 01/11/20 0341 01/11/20 0341 01/15/20 1455 01/16/20 0014 01/16/20 0233  NA 139  --  136  --  135  K 4.1  --  3.4*  --  3.9  CL 107  --  103  --  103  CO2 25  --  20*  --  23  GLUCOSE 96  --  103*  --  97  BUN 30*  --  33*  --  31*  CREATININE 0.81   < > 1.19* 1.21* 1.21*  CALCIUM 8.8*  --  9.2  --  8.5*  GFRNONAA >60   < > 47* 46* 46*  GFRAA >60   < > 55* 54* 54*  ANIONGAP 7  --  13  --  9   < > = values in this interval not displayed.    Recent Labs  Lab 01/09/20 1838 01/11/20 0341 01/15/20 1455  PROT 7.3 6.3* 6.6  ALBUMIN 3.8 3.2* 3.6  AST 30 24 20   ALT 44 37 32  ALKPHOS 73 64 63  BILITOT 0.9 1.0 1.4*    Hematology Recent Labs  Lab 01/15/20 1455 01/16/20 0014 01/16/20 0233  WBC 9.3 7.8 7.6  RBC 4.03 3.96 3.94  HGB 12.4 12.1 12.1  HCT 36.8 36.8 36.4  MCV 91.3 92.9 92.4  MCH 30.8 30.6 30.7  MCHC 33.7 32.9 33.2  RDW 13.0 13.1 13.2  PLT 355 332 321   BNP Recent Labs  Lab 01/15/20 1455  BNP 20.2    DDimer No results for input(s): DDIMER in the last 168 hours.   Radiology/Studies:  CT ANGIO CHEST PE W OR WO CONTRAST  Result Date: 01/15/2020 CLINICAL DATA:  69 year old female with chest pain and shortness of breath. EXAM: CT ANGIOGRAPHY CHEST WITH CONTRAST TECHNIQUE: Multidetector CT imaging of the chest was performed using the standard protocol during bolus administration of intravenous contrast. Multiplanar CT image reconstructions and MIPs were obtained to evaluate the vascular anatomy. CONTRAST:  11mL OMNIPAQUE IOHEXOL 350 MG/ML SOLN COMPARISON:  Chest CT dated 01/09/2020. FINDINGS: Cardiovascular: There is no cardiomegaly or pericardial effusion. The thoracic aorta is unremarkable. The origins of the great vessels of the aortic arch appear patent as visualized. No pulmonary artery embolus identified. Mediastinum/Nodes: There is no hilar or mediastinal adenopathy. There is a small hiatal hernia. The esophagus and the thyroid gland are grossly unremarkable. No mediastinal fluid collection. Lungs/Pleura: Right upper and right lower lobe patchy and streaky ground-glass opacities relatively similar to prior CT most consistent with pneumonia, viral or atypical etiology including COVID-19. Clinical correlation and continued follow-up recommended. Minimal left upper lobe hazy density may represent atelectasis or infiltrate. There is no pleural effusion or pneumothorax. The central airways are patent. Upper Abdomen: Cholecystectomy. Musculoskeletal: Degenerative changes of the spine. Partially visualized lower thoracic spine Harrington rods. No acute osseous pathology. Review of the MIP images  confirms the above findings. IMPRESSION: 1. No pulmonary artery embolus identified. 2. Right lung opacity similar to prior CT most consistent with atypical pneumonia, likely COVID-19. Clinical correlation and continued follow-up recommended. Electronically Signed   By: Anner Crete M.D.   On: 01/15/2020 21:04   DG Chest Portable 1 View  Result Date: 01/15/2020 CLINICAL DATA:  Chest pain and shortness of breath EXAM: PORTABLE CHEST 1 VIEW COMPARISON:  01/09/2020 FINDINGS: Heart and mediastinal shadows remain normal. Patchy pulmonary infiltrates previously seen in the right lung have improved. No worsening or new finding. No effusion. IMPRESSION: Improving patchy infiltrates in the right lung. Electronically Signed   By: Nelson Chimes M.D.   On: 01/15/2020 14:55   ECHOCARDIOGRAM COMPLETE  Result Date: 01/16/2020   ECHOCARDIOGRAM REPORT   Jody Name:   Jody Brewer Date of Exam: 01/16/2020 Medical Rec #:  [id number]          Height:       67.0 in Accession #:    [id number]         Weight:       176.1 lb Date of Birth:  Feb 26, 1951         BSA:  1.92 m Jody Age:    76 years           BP:           117/62 mmHg Jody Gender: F                  HR:           80 bpm. Exam Location:  Inpatient Procedure: 2D Echo Indications:    Elevated Troponin  History:        Jody has no prior history of Echocardiogram examinations.                 Signs/Symptoms:Shortness of Breath and Chest Pain; Risk                 Factors:Hypertension. Pneumonia due to COVID-19 virus.  Sonographer:    Leeroy Bock Turrentine Referring Phys: [id number] RAVI PAHWANI  Sonographer Comments: Covid positive IMPRESSIONS  1. Left ventricular ejection fraction, by visual estimation, is 65 to 70%. The left ventricle has hyperdynamic function. There is no left ventricular hypertrophy.  2. Left ventricular diastolic parameters are consistent with Grade I diastolic dysfunction (impaired relaxation).  3. The left ventricle has no regional  wall motion abnormalities.  4. Global right ventricle has normal systolic function.The right ventricular size is normal. No increase in right ventricular wall thickness.  5. Left atrial size was normal.  6. Right atrial size was normal.  7. The mitral valve is normal in structure. No evidence of mitral valve regurgitation. No evidence of mitral stenosis.  8. The tricuspid valve is normal in structure.  9. The tricuspid valve is normal in structure. Tricuspid valve regurgitation is trivial. 10. The aortic valve is normal in structure. Aortic valve regurgitation is not visualized. No evidence of aortic valve sclerosis or stenosis. 11. The pulmonic valve was normal in structure. Pulmonic valve regurgitation is not visualized. 12. The inferior vena cava is normal in size with greater than 50% respiratory variability, suggesting right atrial pressure of 3 mmHg. FINDINGS  Left Ventricle: Left ventricular ejection fraction, by visual estimation, is 65 to 70%. The left ventricle has hyperdynamic function. The left ventricle has no regional wall motion abnormalities. There is no left ventricular hypertrophy. Left ventricular diastolic parameters are consistent with Grade I diastolic dysfunction (impaired relaxation). Normal left atrial pressure. Right Ventricle: The right ventricular size is normal. No increase in right ventricular wall thickness. Global RV systolic function is has normal systolic function. Left Atrium: Left atrial size was normal in size. Right Atrium: Right atrial size was normal in size Pericardium: There is no evidence of pericardial effusion. Mitral Valve: The mitral valve is normal in structure. No evidence of mitral valve regurgitation. No evidence of mitral valve stenosis by observation. Tricuspid Valve: The tricuspid valve is normal in structure. Tricuspid valve regurgitation is trivial. Aortic Valve: The aortic valve is normal in structure. Aortic valve regurgitation is not visualized. The aortic  valve is structurally normal, with no evidence of sclerosis or stenosis. Pulmonic Valve: The pulmonic valve was normal in structure. Pulmonic valve regurgitation is not visualized. Pulmonic regurgitation is not visualized. Aorta: The aortic root, ascending aorta and aortic arch are all structurally normal, with no evidence of dilitation or obstruction. Venous: The inferior vena cava is normal in size with greater than 50% respiratory variability, suggesting right atrial pressure of 3 mmHg. IAS/Shunts: No atrial level shunt detected by color flow Doppler. There is no evidence of a patent foramen ovale. No ventricular septal defect  is seen or detected. There is no evidence of an atrial septal defect.  LEFT VENTRICLE PLAX 2D LVIDd:         3.70 cm  Diastology LVIDs:         2.50 cm  LV e' lateral:   9.68 cm/s LV PW:         0.80 cm  LV E/e' lateral: 9.5 LV IVS:        0.90 cm  LV e' medial:    7.29 cm/s LVOT diam:     1.90 cm  LV E/e' medial:  12.6 LV SV:         36 ml LV SV Index:   18.26 LVOT Area:     2.84 cm  RIGHT VENTRICLE RV S prime:     18.00 cm/s TAPSE (M-mode): 1.7 cm LEFT ATRIUM             Index       RIGHT ATRIUM           Index LA diam:        3.20 cm 1.67 cm/m  RA Area:     12.80 cm LA Vol (A2C):   62.8 ml 32.78 ml/m RA Volume:   30.60 ml  15.97 ml/m LA Vol (A4C):   38.2 ml 19.94 ml/m LA Biplane Vol: 51.9 ml 27.09 ml/m  AORTIC VALVE LVOT Vmax:   141.00 cm/s LVOT Vmean:  98.200 cm/s LVOT VTI:    0.306 m  AORTA Ao Root diam: 2.80 cm MITRAL VALVE MV Area (PHT): 4.06 cm             SHUNTS MV PHT:        54.23 msec           Systemic VTI:  0.31 m MV Decel Time: 187 msec             Systemic Diam: 1.90 cm MV E velocity: 91.70 cm/s 103 cm/s MV A velocity: 85.30 cm/s 70.3 cm/s MV E/A ratio:  1.08       1.5  Donato Schultz MD Electronically signed by Donato Schultz MD Signature Date/Time: 01/16/2020/2:08:37 PM    Final        TIMI Risk Score for Unstable Angina or Non-ST Elevation MI:   The Jody's TIMI  risk score is 3, which indicates a 13% risk of all cause mortality, new or recurrent myocardial infarction or need for urgent revascularization in the next 14 days.   Assessment and Plan:   Chest Pain with Known CAD - Jody presented for evaluation of sudden onset of crushing chest pain in setting of recently diagnosed COVID-19 pneumonia. 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 See report above. - EKG shows no acute ST/T changes. - High-sensitivity troponin 5 >> 10 >> 33 >> 31 >> 25 >> 29. Not consistent with ACS. - BNP normal.  - Chest CTA negative for PE.  - Echo showed LVEF 65-70% with grade 1 diastolic dysfunction and normal wall motion.  - Unclear etiology of severe chest pain. If Jody had myocarditis secondary to COVID, I think troponin would be higher. Do not suspect any additional cardiac work-up necessary at this time. Continue primary prevention. Can change to low dose Aspirin. It does not look like she is on a statin at home. Will check lipid panel. She can follow-up as outpatient.  Hypertension - Jody has a history of hypertension but BP has been soft at times her. Most recent  BP 104/57. Jody also states she has been very dehydrated lately.  - Would continue to hold home Olmesartan-HCTZ for now.  AKI - Creatinine 1.19 on admission and 1.21 today. Looks like recent baseline is 0.8 to 0.9. - Suspect secondary to dehydration. - Continue to hold home Olmesartan-HCTZ. - Continue daily BMET.  Obstructive Sleep Apnea - Compliant with CPAP.   Otherwise, per primary team.   For questions or updates, please contact [facility] Please consult [email or web address] for contact info under     Signed, Corrin ParkerCallie E Goodrich, PA-C  01/16/2020 3:44 PM   I have seen and examined the Jody along with Corrin Parkerallie E Goodrich, PA-C.  I have reviewed the chart, notes and new data.  I agree with PA/NP's note.  Key new complaints: fatigue, but no CV  complaints  Key new findings / data: presenting symptoms were worrisome, but all subsequent workup is very reassuring. ECG is normal, troponin is barely above upper limit of "normal" with a "flat" pattern and on echo LV wall motion is normal even with a hyperdynamic contraction pattern. The non-cardiac CT on this admission shows minimal coronary calcification. A coronary CTA about 1 year ago reported a moderate stenosis in a distal vessel (50-69% LCx beyond OM1, FFR 0.82).  PLAN: Recommend ASA and lipid lowering therapy to reach LDL<70. OK to give a Rx for SL NTG at DC with instructions for appropriate use (one SL tab q525min to max 3 prn severe CP, seek attention if no improvement after 30 min). No plan for coronary angiography or other cardiac workup at this time. She has COVID pneumonia and significant thoracic spine disease which could be alternative explanations for her symptoms. Nevertheless, she also has moderate CAD and might need anatomical evaluation of the coronaries again in the future if her symptoms recur outside COVID-19 infection.   Thurmon FairMihai Hendryx Ricke, MD, [facility] [phone] 01/16/2020, 4:57 PM

## 2020-01-16 NOTE — ED Notes (Signed)
Assumed care of pt. hospitalist at bedside

## 2020-01-16 NOTE — ED Notes (Signed)
MD Toniann Fail paged about EKG

## 2020-01-16 NOTE — Plan of Care (Signed)
  Problem: Coping: Goal: Psychosocial and spiritual needs will be supported Outcome: Progressing   

## 2020-01-16 NOTE — ED Notes (Signed)
Pt transported to 2W RM36 in NAD with all belongings via cart transport on monitors. Pt alert, speaking in full sentences. Breathing easy, non-labored. vss on monitors.

## 2020-01-16 NOTE — ED Notes (Signed)
Report given to [facility], RN. All questions answered

## 2020-01-16 NOTE — ED Notes (Signed)
Pt reporting relief in chest pain.

## 2020-01-16 NOTE — ED Notes (Signed)
Troponin drawn, labeled and sent Morphine given per MAR. Name/DOb verified

## 2020-01-16 NOTE — Progress Notes (Signed)
  Echocardiogram 2D Echocardiogram has been performed.  Jody Brewer 01/16/2020, 12:01 PM

## 2020-01-17 DIAGNOSIS — R072 Precordial pain: Secondary | ICD-10-CM | POA: Diagnosis not present

## 2020-01-17 LAB — BASIC METABOLIC PANEL
Anion gap: 8 (ref 5–15)
BUN: 24 mg/dL — ABNORMAL HIGH (ref 8–23)
CO2: 22 mmol/L (ref 22–32)
Calcium: 8.8 mg/dL — ABNORMAL LOW (ref 8.9–10.3)
Chloride: 109 mmol/L (ref 98–111)
Creatinine, Ser: 1.05 mg/dL — ABNORMAL HIGH (ref 0.44–1.00)
GFR calc Af Amer: >60 mL/min (ref >60)
GFR calc non Af Amer: 55 mL/min — ABNORMAL LOW (ref >60)
Glucose, Bld: 140 mg/dL — ABNORMAL HIGH (ref 70–99)
Potassium: 4.7 mmol/L (ref 3.5–5.1)
Sodium: 139 mmol/L (ref 135–145)

## 2020-01-17 LAB — LIPID PANEL
Cholesterol: 135 mg/dL (ref 0–200)
HDL: 52 mg/dL (ref >40)
LDL Cholesterol: 67 mg/dL (ref 0–99)
Total CHOL/HDL Ratio: 2.6 RATIO
Triglycerides: 80 mg/dL (ref <150)
VLDL: 16 mg/dL (ref 0–40)

## 2020-01-17 MED ORDER — ASPIRIN EC 81 MG PO TBEC: 81.0000 mg | DELAYED_RELEASE_TABLET | Freq: Every day | ORAL | 0 refills | Status: AC

## 2020-01-17 MED ORDER — NITROGLYCERIN 0.4 MG SL SUBL: 0.4000 mg | SUBLINGUAL_TABLET | SUBLINGUAL | 0 refills | Status: DC | PRN

## 2020-01-17 MED ORDER — IRBESARTAN 150 MG PO TABS
150.0000 mg | ORAL_TABLET | Freq: Every day | ORAL | Status: DC
  Filled 2020-01-17: qty 1

## 2020-01-17 MED ORDER — ATORVASTATIN CALCIUM 20 MG PO TABS: 20.0000 mg | ORAL_TABLET | Freq: Every day | ORAL | 0 refills | Status: DC

## 2020-01-17 NOTE — Progress Notes (Signed)
Daily Progress Note   Patient pleasant and calm at bedside. Patients awaits d/c today

## 2020-01-17 NOTE — Discharge Summary (Signed)
Physician Discharge Summary  Jody Brewer QQV:[id number] DOB: 1951/06/27 DOA: 01/15/2020  PCP: Danella Penton, MD  Admit date: 01/15/2020 Discharge date: 01/17/2020  Time spent: 45 minutes  Recommendations for Outpatient Follow-up:  1. Follow-up with primary care provider 1 to 2 weeks for evaluation of continued recovery from Covid 2. Follow-up with cardiology as needed   Discharge Diagnoses:  Principal Problem:   Chest pain Active Problems:   HTN (hypertension), benign   ARF (acute renal failure) (HCC)   Asthma, intermittent, uncomplicated   Pneumonia due to COVID-19 virus   Discharge Condition: Stable  Diet recommendation: Heart healthy  Filed Weights   01/16/20 0624  Weight: 79.9 kg    History of present illness:  Jody Brewer is a 69 y.o. female with history of hypertension sleep apnea discharged 01/11/20 after being treated for COVID-19 infection presently on Decadron and Omnicef still has 5 more days of Decadron left presented to the ER 1/19 after having chest pain.  Patient stated that afternoon after walking down the stairs at her house patient started having substernal pressure-like symptom with some nausea and diaphoresis.  Lasted for around 15 minutes.  EMS was called and patient was found to have low blood pressure at that time.  Patient was brought to the ER.  Patient chest pain subsided without any intervention at that time.   Hospital Course:  #1.  Chest pain.Atypical.  Patient discharged 5 days prior after 3-day hospitalization for Covid 19/pneumonia.  She reported she did well for a couple of days and then developed shortness of breath chest pain described as sharp located left anterior shooting to the back associated with diaphoresis and shortness of breath.  She has had decreased oral intake.  EMS was called she was found to be hypotensive.  Troponins 5>10>33>31>25>29.  Chest x-ray revealed improving patchy infiltrates in the right lung. EKG with SR  but initially some ST elevations that resolved.  Admitting note indicated rhythm strips reviewed by cardiology fellow during the night recommended hospitalist admission for standard chest pain rule out.  Evaluated by cardiology who opine troponins not consistent with ACS.  Not recommend further inpatient cardiac work-up.  Recommend aspirin and statin to get LDL less than 70.  Lipid panel reveals an LDL 67.  Cardiology note also indicates patient with moderate CAD and may need anatomical evaluation of the coronaries in the future if her symptoms recur outside Covid infection.  #2.  Hypertension.  Reportedly patient was hypotensive on presentation.  Home medications include HCTZ and olmsartan.  She was provided with IV fluids and her blood pressure increased.  Discharge blood pressure 113/62 have recommended she resume home antihypertensive meds.  #3.  Acute kidney injury.  Likely related to decreased oral intake in the setting of recent illness.  Creatinine 1.2 on admission.  At discharge creatinine 1.05.  #4.  Pneumonia due to COVID-19 virus.  Patient discharged 1/15 after a 3-day hospitalization for same.  Chest x-ray revealed improving patchy infiltrates on the right lung.  CT of the chest no pulmonary artery embolus identified, right lung opacity similar to prior CT most consistent with atypical pneumonia likely COVID-19.  Oxygen saturation level greater than 90% on room air.  She remained afebrile.  He completed Remdisivir. She remains on decedron 3 more days  Procedures:    Consultations:  croitur cards  Discharge Exam: Vitals:   01/17/20 0615 01/17/20 0802  BP: 113/62 (!) 160/111  Pulse: 80 98  Resp: 20 20  Temp:  98.3 F (36.8 C) (!) 97.4 F (36.3 C)  SpO2: 98% 100%    General: Awake alert no acute distress Cardiovascular: Regular rate and rhythm no murmur gallop or rub no lower extremity edema chest nontender to palpation Respiratory: Normal effort breath sounds with fair air  movement sounds no wheeze no crackles somewhat distant  Discharge Instructions   Discharge Instructions    Diet - low sodium heart healthy   Complete by: As directed    Discharge instructions   Complete by: As directed    Follow up with PCP 1-2 weeks for evaluation of progression of recovery from covid. Follow up with cardiology as needed   Increase activity slowly   Complete by: As directed      Allergies as of 01/17/2020   No Known Allergies     Medication List    TAKE these medications   albuterol 108 (90 Base) MCG/ACT inhaler Commonly known as: VENTOLIN HFA Inhale 2 puffs into the lungs See admin instructions. Inhale 2 puffs into the lungs every three to four hours as needed for shortness of breath or wheezing   cefdinir 300 MG capsule Commonly known as: OMNICEF Take 1 capsule (300 mg total) by mouth every 12 (twelve) hours for 6 days.   cetirizine 10 MG tablet Commonly known as: ZYRTEC Take 10 mg by mouth daily.   chlorpheniramine-HYDROcodone 10-8 MG/5ML Suer Commonly known as: TUSSIONEX Take 5 mLs by mouth every 12 (twelve) hours as needed for cough.   cholecalciferol 25 MCG (1000 UNIT) tablet Commonly known as: VITAMIN D3 Take 1,000 Units by mouth 2 (two) times daily.   dexamethasone 6 MG tablet Commonly known as: DECADRON Take 1 tablet (6 mg total) by mouth daily. What changed: when to take this   estradiol 0.5 MG tablet Commonly known as: ESTRACE Take 0.5 mg by mouth daily.   guaiFENesin-dextromethorphan 100-10 MG/5ML syrup Commonly known as: ROBITUSSIN DM Take 10 mLs by mouth every 4 (four) hours as needed for cough.   montelukast 10 MG tablet Commonly known as: SINGULAIR Take 10 mg by mouth every evening.   Multi-Vitamin tablet Take 1 tablet by mouth daily.   nortriptyline 25 MG capsule Commonly known as: PAMELOR Take 25 mg by mouth at bedtime.   olmesartan-hydrochlorothiazide 20-12.5 MG tablet Commonly known as: BENICAR HCT Take 1 tablet  by mouth daily.   pantoprazole 40 MG tablet Commonly known as: PROTONIX Take 40 mg by mouth 2 (two) times daily.   venlafaxine XR 75 MG 24 hr capsule Commonly known as: EFFEXOR-XR Take 75 mg by mouth daily.   Vitamin C 500 MG Chew Chew 500 mg by mouth daily.   vitamin E 180 MG (400 UNITS) capsule Take 400 Units by mouth daily.   zinc gluconate 50 MG tablet Take 50 mg by mouth daily.      No Known Allergies    The results of significant diagnostics from this hospitalization (including imaging, microbiology, ancillary and laboratory) are listed below for reference.    Significant Diagnostic Studies: DG Chest 2 View  Result Date: 12/27/2019 CLINICAL DATA:  Cough, congestion, shortness of breath EXAM: CHEST - 2 VIEW COMPARISON:  12/10/2005 FINDINGS: Linear atelectasis in the lung bases. Heart is normal size. No effusions or acute bony abnormality. Degenerative changes and postoperative changes in the thoracic spine. IMPRESSION: No active cardiopulmonary disease. Electronically Signed   By: Charlett Nose M.D.   On: 12/27/2019 11:44   CT ANGIO CHEST PE W OR WO CONTRAST  Result  Date: 01/15/2020 CLINICAL DATA:  69 year old female with chest pain and shortness of breath. EXAM: CT ANGIOGRAPHY CHEST WITH CONTRAST TECHNIQUE: Multidetector CT imaging of the chest was performed using the standard protocol during bolus administration of intravenous contrast. Multiplanar CT image reconstructions and MIPs were obtained to evaluate the vascular anatomy. CONTRAST:  100mL OMNIPAQUE IOHEXOL 350 MG/ML SOLN COMPARISON:  Chest CT dated 01/09/2020. FINDINGS: Cardiovascular: There is no cardiomegaly or pericardial effusion. The thoracic aorta is unremarkable. The origins of the great vessels of the aortic arch appear patent as visualized. No pulmonary artery embolus identified. Mediastinum/Nodes: There is no hilar or mediastinal adenopathy. There is a small hiatal hernia. The esophagus and the thyroid gland  are grossly unremarkable. No mediastinal fluid collection. Lungs/Pleura: Right upper and right lower lobe patchy and streaky ground-glass opacities relatively similar to prior CT most consistent with pneumonia, viral or atypical etiology including COVID-19. Clinical correlation and continued follow-up recommended. Minimal left upper lobe hazy density may represent atelectasis or infiltrate. There is no pleural effusion or pneumothorax. The central airways are patent. Upper Abdomen: Cholecystectomy. Musculoskeletal: Degenerative changes of the spine. Partially visualized lower thoracic spine Harrington rods. No acute osseous pathology. Review of the MIP images confirms the above findings. IMPRESSION: 1. No pulmonary artery embolus identified. 2. Right lung opacity similar to prior CT most consistent with atypical pneumonia, likely COVID-19. Clinical correlation and continued follow-up recommended. Electronically Signed   By: Elgie CollardArash  Radparvar M.D.   On: 01/15/2020 21:04   CT Angio Chest PE W and/or Wo Contrast  Result Date: 01/10/2020 CLINICAL DATA:  69 year old female with pneumonia, switched antibiotics recently. Persistent fever. Right side pleuritic pain. Positive for COVID-19 last month. EXAM: CT ANGIOGRAPHY CHEST WITH CONTRAST TECHNIQUE: Multidetector CT imaging of the chest was performed using the standard protocol during bolus administration of intravenous contrast. Multiplanar CT image reconstructions and MIPs were obtained to evaluate the vascular anatomy. CONTRAST:  100mL OMNIPAQUE IOHEXOL 350 MG/ML SOLN COMPARISON:  Portable chest earlier today, 01/05/2020. CTA chest 11/08/2018. FINDINGS: Cardiovascular: Good contrast bolus timing in the pulmonary arterial tree. No focal filling defect identified in the pulmonary arteries to suggest acute pulmonary embolism. No calcified coronary artery atherosclerosis is evident. No cardiomegaly or pericardial effusion. Negative visible aorta. Mediastinum/Nodes:  Negative. No lymphadenopathy. Lungs/Pleura: Major airways remain patent. There is widespread scattered right lung peripheral and peribronchial ground-glass opacity in all lobes. In the contralateral left lung there is only occasional mild peripheral and peribronchial ground-glass (series 7, image 51). No consolidation. No pleural effusion. Upper Abdomen: Surgically absent gallbladder. Negative visible liver, spleen, pancreas, adrenal glands, kidneys. Small gastric hiatal hernia otherwise negative visible bowel. Musculoskeletal: Posterior fusion hardware beginning in the lower thoracic spine and continuing inferiorly. Solid-appearing posterior element arthrodesis and superimposed additional levels of lower thoracic interbody ankylosis related to bridging endplate osteophytes. No acute osseous abnormality identified. Review of the MIP images confirms the above findings. IMPRESSION: 1. Negative for acute pulmonary embolus. 2. Widespread right lung and occasional left lung peripheral and peribronchial ground-glass opacity typical in appearance for COVID-19 pneumonia. Other viral/atypical etiology is possible. No pleural effusion. 3. No other acute findings in the chest. Electronically Signed   By: Odessa FlemingH  Hall M.D.   On: 01/10/2020 00:10   DG Chest Portable 1 View  Result Date: 01/15/2020 CLINICAL DATA:  Chest pain and shortness of breath EXAM: PORTABLE CHEST 1 VIEW COMPARISON:  01/09/2020 FINDINGS: Heart and mediastinal shadows remain normal. Patchy pulmonary infiltrates previously seen in the right lung have improved.  No worsening or new finding. No effusion. IMPRESSION: Improving patchy infiltrates in the right lung. Electronically Signed   By: Paulina FusiMark  Shogry M.D.   On: 01/15/2020 14:55   DG Chest Port 1 View  Result Date: 01/09/2020 CLINICAL DATA:  69 year old female with pneumonia, switched antibiotics recently. Persistent fever. Right side pleuritic pain. Positive for COVID-19. Last month. EXAM: PORTABLE CHEST 1  VIEW COMPARISON:  Portable chest 01/05/2020 and earlier. FINDINGS: Portable AP upright view at 2301 hours. Continued patchy and streaky opacity in the right lower lung. Density has mildly increased since 01/05/2020. No superimposed pneumothorax or pleural effusion. Elsewhere lung markings are stable. Normal lung volumes and mediastinal contours. Partially visible thoracolumbar posterior fusion hardware. IMPRESSION: Increased right lower lung pneumonia since 01/05/2020. No pleural effusion. Electronically Signed   By: Odessa FlemingH  Hall M.D.   On: 01/09/2020 23:25   DG Chest Port 1 View  Result Date: 01/05/2020 CLINICAL DATA:  COVID symptoms for 2 weeks. Patient diagnosed positive for COVID-19 December 26, 2019. EXAM: PORTABLE CHEST 1 VIEW COMPARISON:  None. FINDINGS: No pneumothorax. There is infiltrate in the right mid lower lung peripherally. The heart, hila, mediastinum, lungs, and pleura are otherwise unremarkable. IMPRESSION: Right-sided infiltrate consistent with pneumonia. Recommend short-term follow-up imaging to ensure resolution. Electronically Signed   By: Gerome Samavid  Williams III M.D   On: 01/05/2020 13:36   ECHOCARDIOGRAM COMPLETE  Result Date: 01/16/2020   ECHOCARDIOGRAM REPORT   Patient Name:   Anson FretDEBORAH E Brewer Date of Exam: 01/16/2020 Medical Rec #:  [id number]          Height:       67.0 in Accession #:    [id number]         Weight:       176.1 lb Date of Birth:  March 26, 1951         BSA:          1.92 m Patient Age:    68 years           BP:           117/62 mmHg Patient Gender: F                  HR:           80 bpm. Exam Location:  Inpatient Procedure: 2D Echo Indications:    Elevated Troponin  History:        Patient has no prior history of Echocardiogram examinations.                 Signs/Symptoms:Shortness of Breath and Chest Pain; Risk                 Factors:Hypertension. Pneumonia due to COVID-19 virus.  Sonographer:    Leeroy Bockhelsea Turrentine Referring Phys: [id number] RAVI PAHWANI  Sonographer Comments:  Covid positive IMPRESSIONS  1. Left ventricular ejection fraction, by visual estimation, is 65 to 70%. The left ventricle has hyperdynamic function. There is no left ventricular hypertrophy.  2. Left ventricular diastolic parameters are consistent with Grade I diastolic dysfunction (impaired relaxation).  3. The left ventricle has no regional wall motion abnormalities.  4. Global right ventricle has normal systolic function.The right ventricular size is normal. No increase in right ventricular wall thickness.  5. Left atrial size was normal.  6. Right atrial size was normal.  7. The mitral valve is normal in structure. No evidence of mitral valve regurgitation. No evidence of mitral stenosis.  8. The tricuspid valve is normal in structure.  9. The tricuspid valve is normal in structure. Tricuspid valve regurgitation is trivial. 10. The aortic valve is normal in structure. Aortic valve regurgitation is not visualized. No evidence of aortic valve sclerosis or stenosis. 11. The pulmonic valve was normal in structure. Pulmonic valve regurgitation is not visualized. 12. The inferior vena cava is normal in size with greater than 50% respiratory variability, suggesting right atrial pressure of 3 mmHg. FINDINGS  Left Ventricle: Left ventricular ejection fraction, by visual estimation, is 65 to 70%. The left ventricle has hyperdynamic function. The left ventricle has no regional wall motion abnormalities. There is no left ventricular hypertrophy. Left ventricular diastolic parameters are consistent with Grade I diastolic dysfunction (impaired relaxation). Normal left atrial pressure. Right Ventricle: The right ventricular size is normal. No increase in right ventricular wall thickness. Global RV systolic function is has normal systolic function. Left Atrium: Left atrial size was normal in size. Right Atrium: Right atrial size was normal in size Pericardium: There is no evidence of pericardial effusion. Mitral Valve: The  mitral valve is normal in structure. No evidence of mitral valve regurgitation. No evidence of mitral valve stenosis by observation. Tricuspid Valve: The tricuspid valve is normal in structure. Tricuspid valve regurgitation is trivial. Aortic Valve: The aortic valve is normal in structure. Aortic valve regurgitation is not visualized. The aortic valve is structurally normal, with no evidence of sclerosis or stenosis. Pulmonic Valve: The pulmonic valve was normal in structure. Pulmonic valve regurgitation is not visualized. Pulmonic regurgitation is not visualized. Aorta: The aortic root, ascending aorta and aortic arch are all structurally normal, with no evidence of dilitation or obstruction. Venous: The inferior vena cava is normal in size with greater than 50% respiratory variability, suggesting right atrial pressure of 3 mmHg. IAS/Shunts: No atrial level shunt detected by color flow Doppler. There is no evidence of a patent foramen ovale. No ventricular septal defect is seen or detected. There is no evidence of an atrial septal defect.  LEFT VENTRICLE PLAX 2D LVIDd:         3.70 cm  Diastology LVIDs:         2.50 cm  LV e' lateral:   9.68 cm/s LV PW:         0.80 cm  LV E/e' lateral: 9.5 LV IVS:        0.90 cm  LV e' medial:    7.29 cm/s LVOT diam:     1.90 cm  LV E/e' medial:  12.6 LV SV:         36 ml LV SV Index:   18.26 LVOT Area:     2.84 cm  RIGHT VENTRICLE RV S prime:     18.00 cm/s TAPSE (M-mode): 1.7 cm LEFT ATRIUM             Index       RIGHT ATRIUM           Index LA diam:        3.20 cm 1.67 cm/m  RA Area:     12.80 cm LA Vol (A2C):   62.8 ml 32.78 ml/m RA Volume:   30.60 ml  15.97 ml/m LA Vol (A4C):   38.2 ml 19.94 ml/m LA Biplane Vol: 51.9 ml 27.09 ml/m  AORTIC VALVE LVOT Vmax:   141.00 cm/s LVOT Vmean:  98.200 cm/s LVOT VTI:    0.306 m  AORTA Ao Root diam: 2.80 cm MITRAL VALVE MV Area (PHT): 4.06 cm  SHUNTS MV PHT:        54.23 msec           Systemic VTI:  0.31 m MV Decel  Time: 187 msec             Systemic Diam: 1.90 cm MV E velocity: 91.70 cm/s 103 cm/s MV A velocity: 85.30 cm/s 70.3 cm/s MV E/A ratio:  1.08       1.5  Candee Furbish MD Electronically signed by Candee Furbish MD Signature Date/Time: 01/16/2020/2:08:37 PM    Final     Microbiology: Recent Results (from the past 240 hour(s))  Blood Culture (routine x 2)     Status: None   Collection Time: 01/10/20 12:16 AM   Specimen: BLOOD  Result Value Ref Range Status   Specimen Description BLOOD RIGHT ANTECUBITAL  Final   Special Requests   Final    BOTTLES DRAWN AEROBIC AND ANAEROBIC Blood Culture results may not be optimal due to an excessive volume of blood received in culture bottles   Culture   Final    NO GROWTH 5 DAYS Performed at [facility], [address], [place], [place] [address]    Report Status 01/15/2020 FINAL  Final  Blood Culture (routine x 2)     Status: None   Collection Time: 01/10/20 12:16 AM   Specimen: BLOOD  Result Value Ref Range Status   Specimen Description BLOOD LEFT ANTECUBITAL  Final   Special Requests   Final    BOTTLES DRAWN AEROBIC AND ANAEROBIC Blood Culture adequate volume   Culture   Final    NO GROWTH 5 DAYS Performed at [facility], [address] [place], [place] [address]    Report Status 01/15/2020 FINAL  Final  SARS CORONAVIRUS 2 (TAT 6-24 HRS) Nasopharyngeal Nasopharyngeal Swab     Status: Abnormal   Collection Time: 01/15/20 11:21 PM   Specimen: Nasopharyngeal Swab  Result Value Ref Range Status   SARS Coronavirus 2 POSITIVE (A) NEGATIVE Final    Comment: RESULT CALLED TO, READ BACK BY AND VERIFIED WITH: J. CICHORSKI,RN 2025 01/16/2020 T. TYSOR (NOTE) SARS-CoV-2 target nucleic acids are DETECTED. The SARS-CoV-2 RNA is generally detectable in upper and lower respiratory specimens during the acute phase of infection. Positive results are indicative of the presence of SARS-CoV-2 RNA. Clinical correlation with patient history and  other diagnostic information is  necessary to determine patient infection status. Positive results do not rule out bacterial infection or co-infection with other viruses.  The expected result is Negative. Fact Sheet for Patients: SugarRoll.be Fact Sheet for Healthcare Providers: https://www.woods-mathews.com/ This test is not yet approved or cleared by the [place] FDA and  has been authorized for detection and/or diagnosis of SARS-CoV-2 by FDA under an Emergency Use Authorization (EUA). This EUA will remain  in effect (meaning this test can be used) fo r the duration of the COVID-19 declaration under Section 564(b)(1) of the Act, 21 U.S.C. section 360bbb-3(b)(1), unless the authorization is terminated or revoked sooner. Performed at [facility], [place] [address], [place], [place] [address]      Labs: Basic Metabolic Panel: Recent Labs  Lab 01/11/20 0341 01/15/20 1455 01/16/20 0014 01/16/20 0233 01/17/20 0316  NA 139 136  --  135 139  K 4.1 3.4*  --  3.9 4.7  CL 107 103  --  103 109  CO2 25 20*  --  23 22  GLUCOSE 96 103*  --  97 140*  BUN  30* 33*  --  31* 24*  CREATININE 0.81 1.19* 1.21* 1.21* 1.05*  CALCIUM 8.8* 9.2  --  8.5* 8.8*  MG  --  2.4  --   --   --    Liver Function Tests: Recent Labs  Lab 01/11/20 0341 01/15/20 1455  AST 24 20  ALT 37 32  ALKPHOS 64 63  BILITOT 1.0 1.4*  PROT 6.3* 6.6  ALBUMIN 3.2* 3.6   No results for input(s): LIPASE, AMYLASE in the last 168 hours. No results for input(s): AMMONIA in the last 168 hours. CBC: Recent Labs  Lab 01/11/20 0341 01/15/20 1455 01/16/20 0014 01/16/20 0233  WBC 10.6* 9.3 7.8 7.6  NEUTROABS 7.5 6.2  --   --   HGB 11.0* 12.4 12.1 12.1  HCT 32.9* 36.8 36.8 36.4  MCV 89.6 91.3 92.9 92.4  PLT 407* 355 332 321   Cardiac Enzymes: No results for input(s): CKTOTAL, CKMB, CKMBINDEX, TROPONINI in the last 168 hours. BNP: BNP (last 3 results) Recent Labs     01/15/20 1455  BNP 20.2    ProBNP (last 3 results) No results for input(s): PROBNP in the last 8760 hours.  CBG: Recent Labs  Lab 01/15/20 1520 01/16/20 1205  GLUCAP 87 130*       Signed:  Gwenyth Bender NP [facility] 01/17/2020, 1:25 PM

## 2020-01-17 NOTE — Care Management Obs Status (Signed)
MEDICARE OBSERVATION STATUS NOTIFICATION   Patient Details  Name: Jody Brewer MRN: [id number] Date of Birth: 1951/04/29   Medicare Observation Status Notification Given:  Yes    Lawerance Sabal, RN 01/17/2020, 9:34 AM

## 2020-01-17 NOTE — Care Management (Signed)
Spoke w patient on the phone.She states she will DC to home and stay at her daughter's house who will be able to provide 24/7 support.

## 2020-02-11 ENCOUNTER — Other Ambulatory Visit: Payer: Self-pay | Admitting: Internal Medicine

## 2020-02-11 DIAGNOSIS — Z1231 Encounter for screening mammogram for malignant neoplasm of breast: Secondary | ICD-10-CM

## 2020-03-13 ENCOUNTER — Ambulatory Visit
Admission: RE | Admit: 2020-03-13 | Discharge: 2020-03-13 | Disposition: A | Discharge status: Home / Self Care | Payer: Medicare Other | Source: Ambulatory Visit | Attending: Internal Medicine | Admitting: Internal Medicine

## 2020-03-13 DIAGNOSIS — Z1231 Encounter for screening mammogram for malignant neoplasm of breast: Secondary | ICD-10-CM | POA: Diagnosis present

## 2021-02-12 ENCOUNTER — Other Ambulatory Visit: Payer: Self-pay | Admitting: Internal Medicine

## 2021-02-12 DIAGNOSIS — Z1231 Encounter for screening mammogram for malignant neoplasm of breast: Secondary | ICD-10-CM

## 2021-03-07 IMAGING — DX DG CHEST 1V PORT
1 series · 1 of 1 positions shown · non-contrast
Comparison: None.

CLINICAL DATA: COVID symptoms for 2 weeks. Patient diagnosed
positive for COVID-15 December, 2019.

EXAM:
PORTABLE CHEST 1 VIEW

[chest ap]
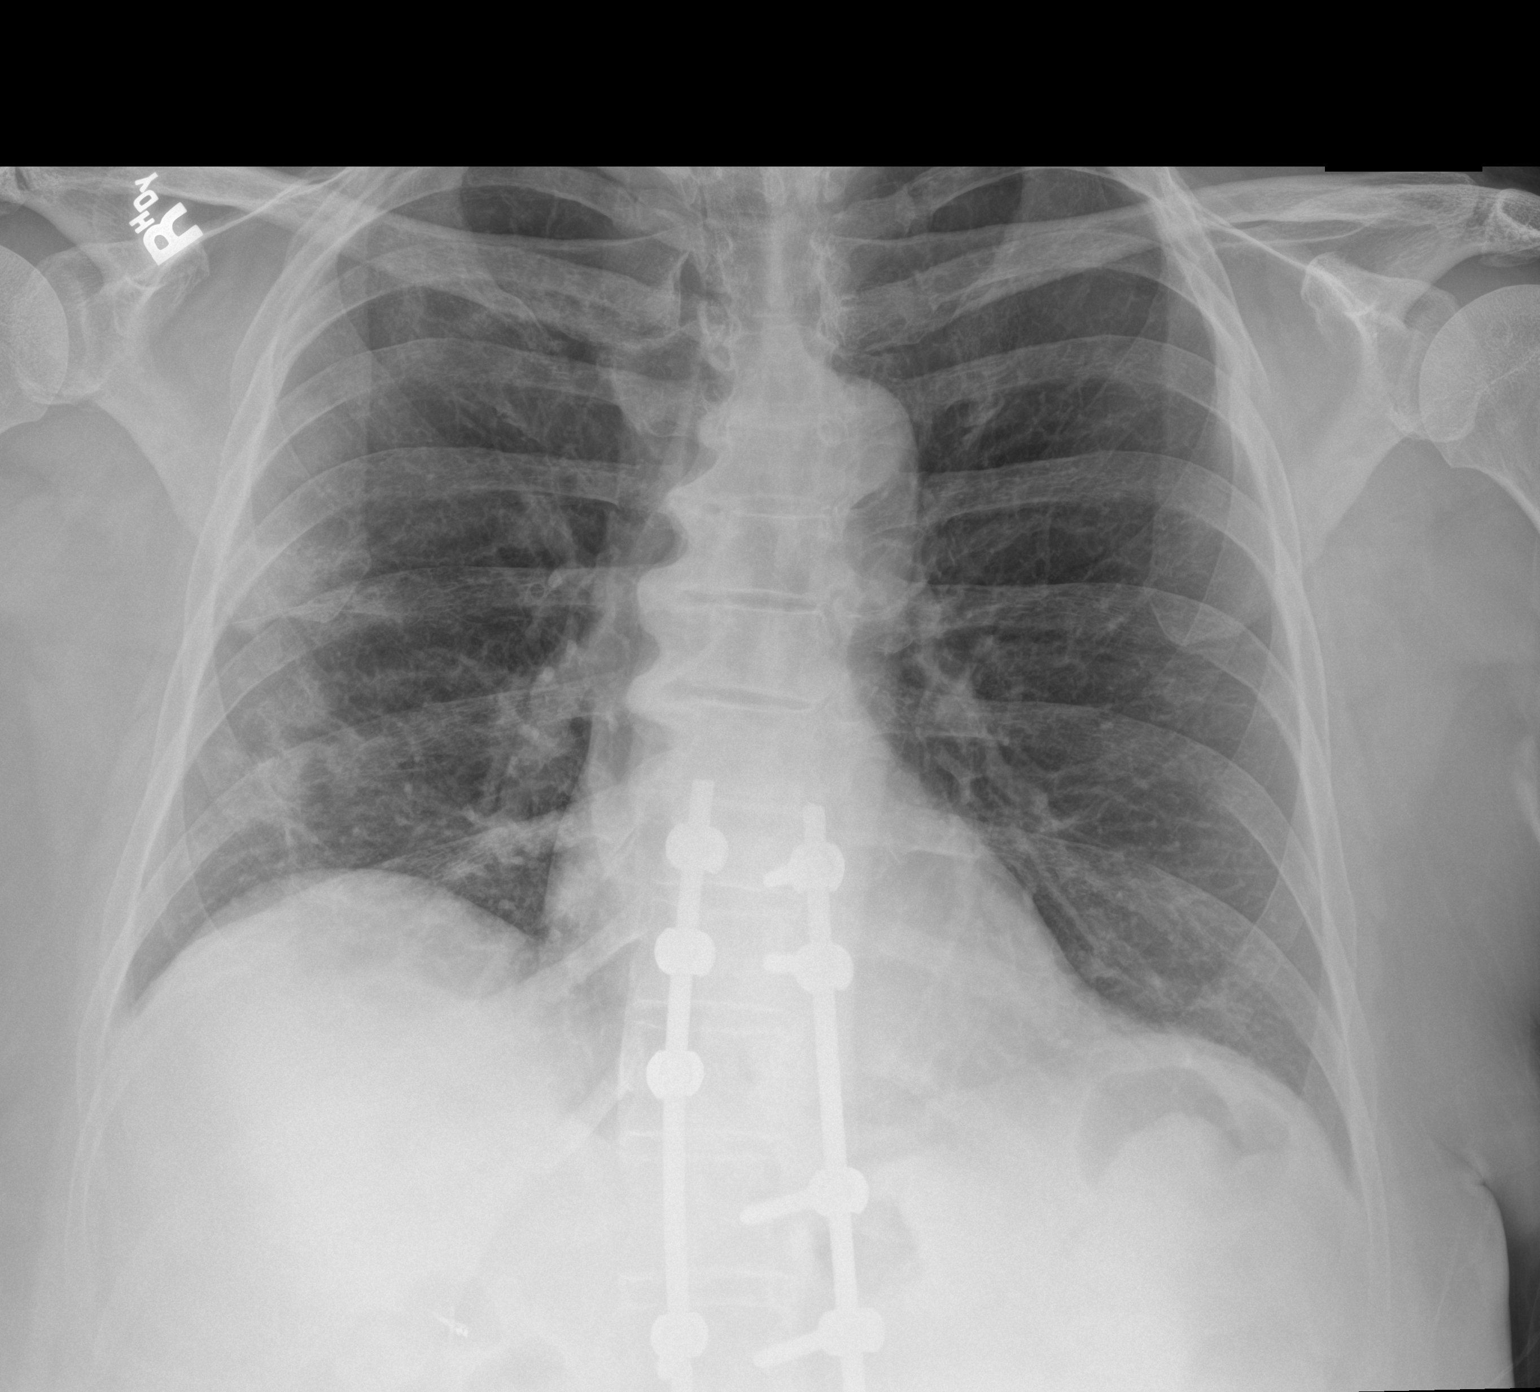

[1 of 1 positions shown; findings below may reference images not displayed]

FINDINGS: No pneumothorax. There is infiltrate in the right mid lower lung
peripherally. The heart, hila, mediastinum, lungs, and pleura are
otherwise unremarkable.
IMPRESSION: Right-sided infiltrate consistent with pneumonia. Recommend
short-term follow-up imaging to ensure resolution.

## 2021-03-11 IMAGING — CT CT ANGIO CHEST
2 of 6 series · 18 of 46 positions shown · IV contrast (omnipaque)
Comparison: Portable chest earlier today, 01/05/2020. CTA chest
11/08/2018.

CLINICAL DATA: 68-year-old female with pneumonia, switched
antibiotics recently. Persistent fever. Right side pleuritic pain.
Positive for R4MWY-R7 last month.

EXAM:
CT ANGIOGRAPHY CHEST WITH CONTRAST
TECHNIQUE: Multidetector CT imaging of the chest was performed using the
standard protocol during bolus administration of intravenous
contrast. Multiplanar CT image reconstructions and MIPs were
obtained to evaluate the vascular anatomy.
CONTRAST:  100mL OMNIPAQUE IOHEXOL 350 MG/ML SOLN

[Series 6: thins · axial · 0.67mm/px · z∈[-610,-322]mm · 15 of 316 slices shown]
[im 14/316  lung]
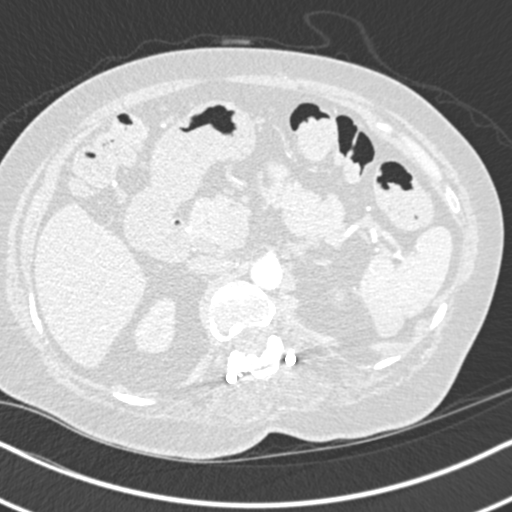
[im 42/316  soft-tissue]
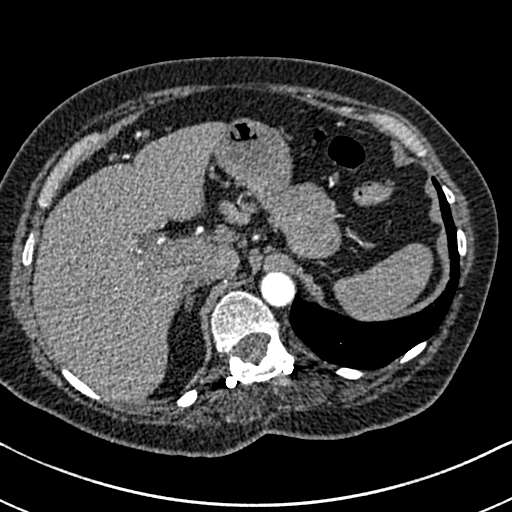
[im 55/316  lung]
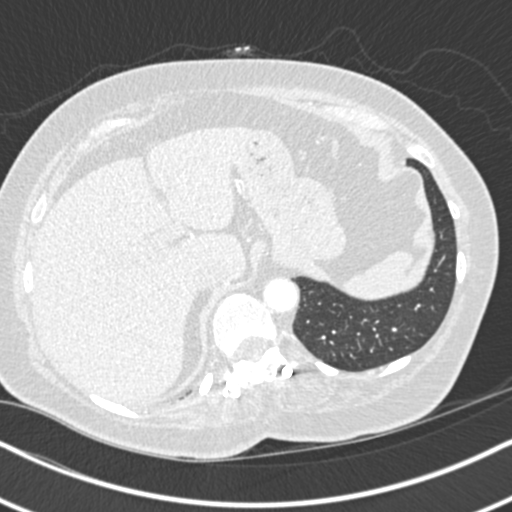
[im 83/316  soft-tissue]
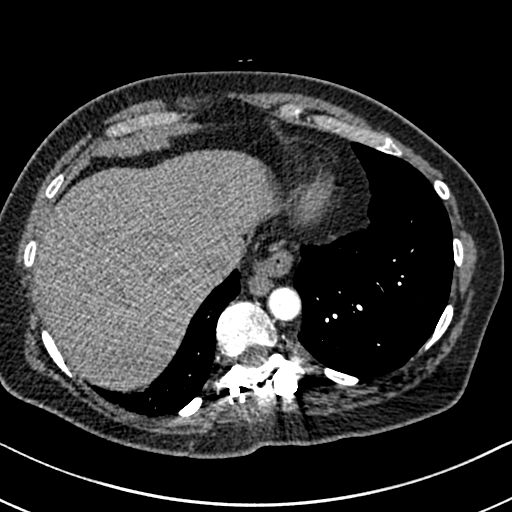
[im 96/316  lung]
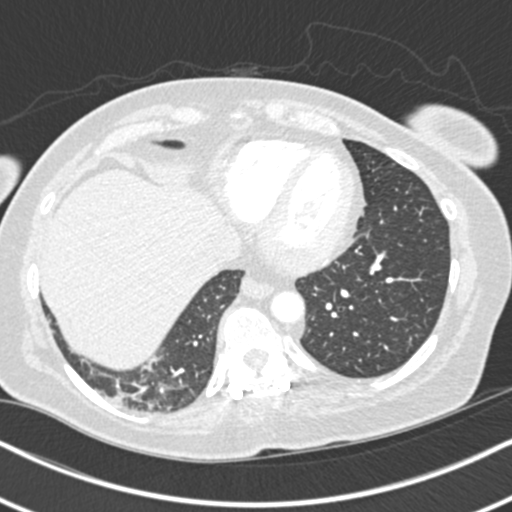
[im 124/316  soft-tissue]
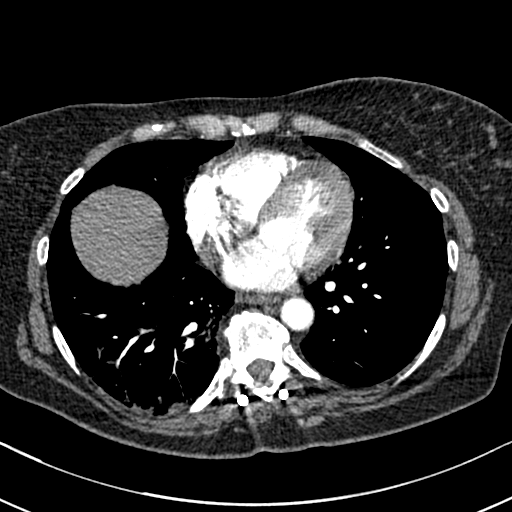
[im 137/316  lung]
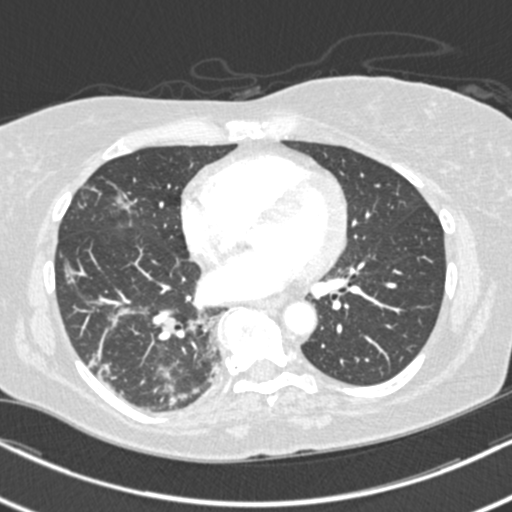
[im 165/316  soft-tissue]
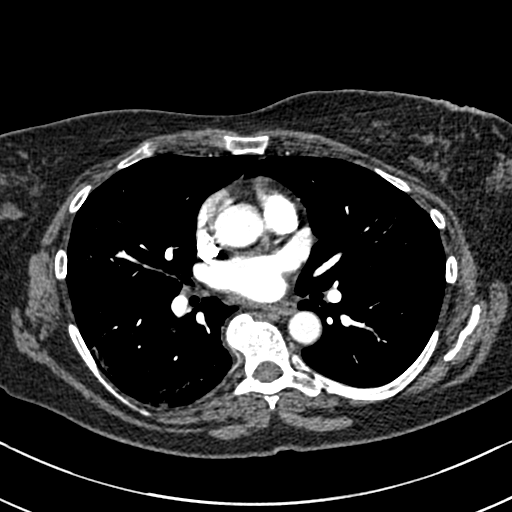
[im 179/316  lung]
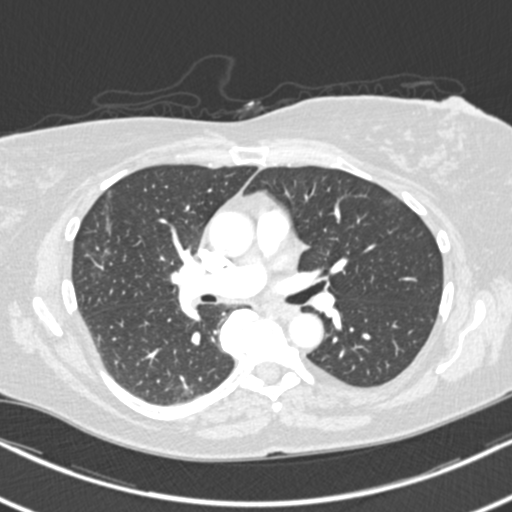
[im 192/316  soft-tissue]
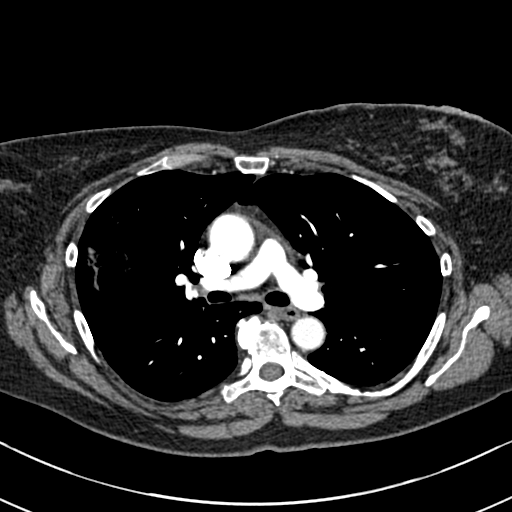
[im 220/316  lung]
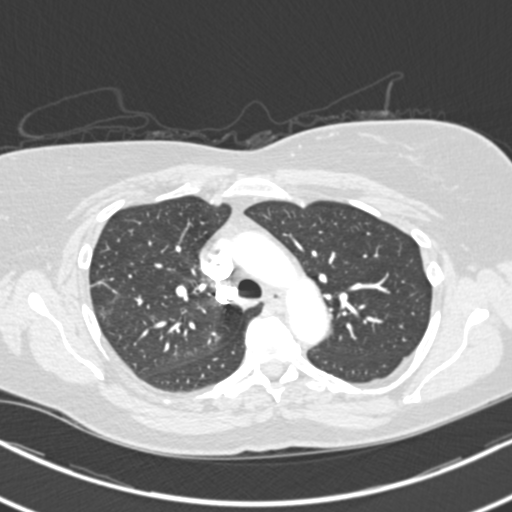
[im 233/316  soft-tissue]
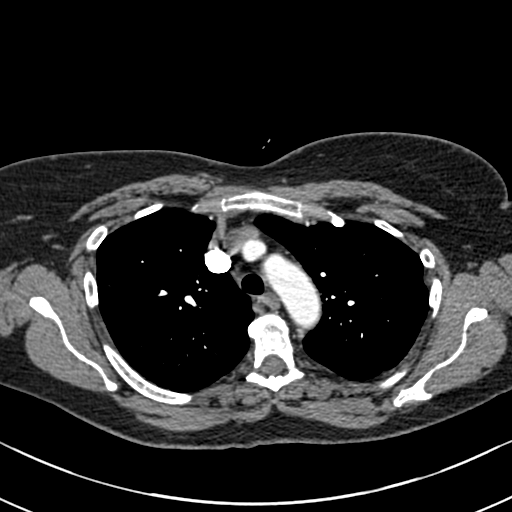
[im 261/316  lung]
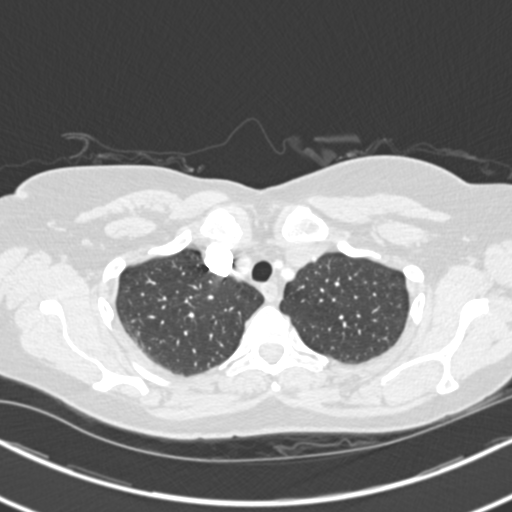
[im 274/316  soft-tissue]
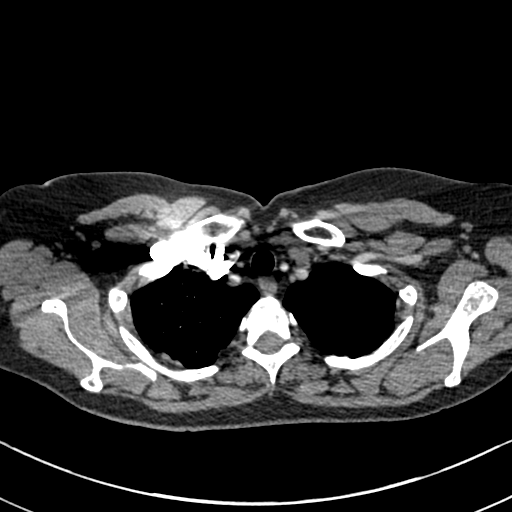
[im 302/316  lung]
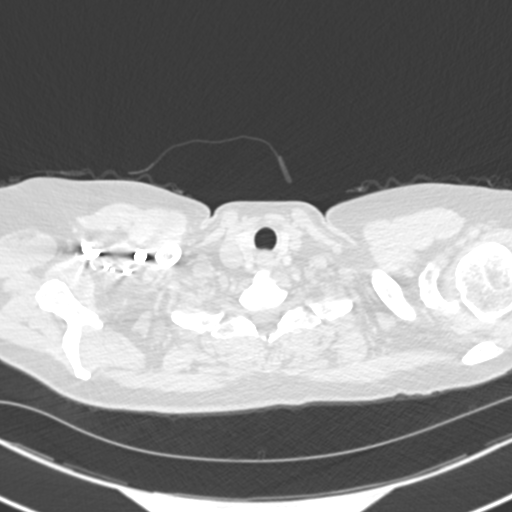

[Series 8: coronal mpr · coronal · 0.62mm/px · 3 of 84 slices shown]
[im 21/84  soft-tissue]
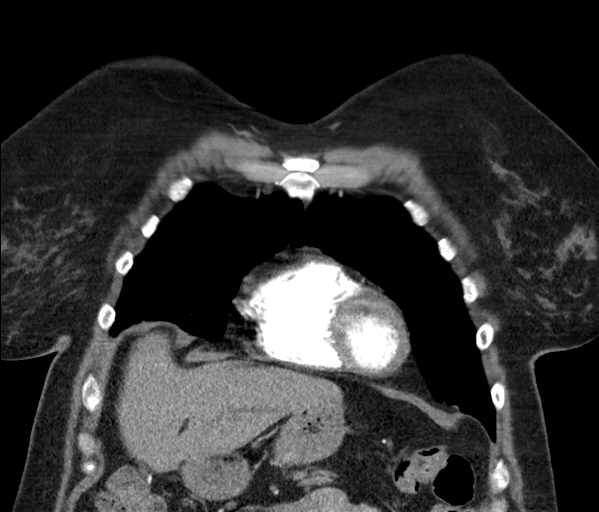
[im 42/84  soft-tissue]
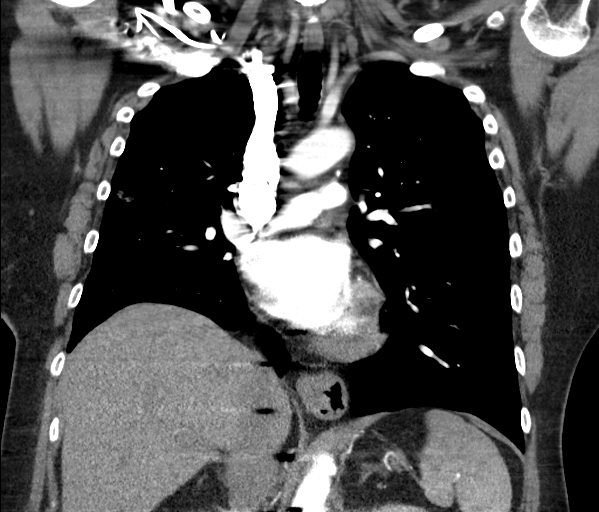
[im 63/84  soft-tissue]
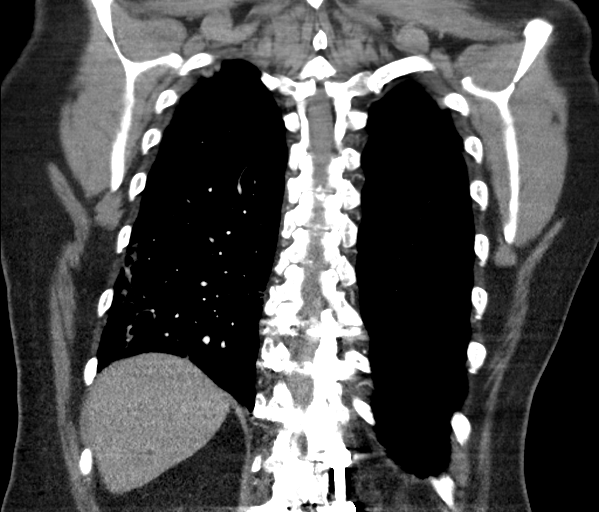

[18 of 46 positions shown; findings below may reference images not displayed]

FINDINGS: Cardiovascular: Good contrast bolus timing in the pulmonary arterial
tree.

No focal filling defect identified in the pulmonary arteries to
suggest acute pulmonary embolism.

No calcified coronary artery atherosclerosis is evident. No
cardiomegaly or pericardial effusion. Negative visible aorta.

Mediastinum/Nodes: Negative. No lymphadenopathy.

Lungs/Pleura: Major airways remain patent. There is widespread
scattered right lung peripheral and peribronchial ground-glass
opacity in all lobes. In the contralateral left lung there is only
occasional mild peripheral and peribronchial ground-glass (series 7,
image 51).

No consolidation. No pleural effusion.

Upper Abdomen: Surgically absent gallbladder. Negative visible
liver, spleen, pancreas, adrenal glands, kidneys. Small gastric
hiatal hernia otherwise negative visible bowel.

Musculoskeletal: Posterior fusion hardware beginning in the lower
thoracic spine and continuing inferiorly. Solid-appearing posterior
element arthrodesis and superimposed additional levels of lower
thoracic interbody ankylosis related to bridging endplate
osteophytes. No acute osseous abnormality identified.

Review of the MIP images confirms the above findings.
IMPRESSION: 1. Negative for acute pulmonary embolus.
2. Widespread right lung and occasional left lung peripheral and
peribronchial ground-glass opacity typical in appearance for
R4MWY-R7 pneumonia. Other viral/atypical etiology is possible. No
pleural effusion.
3. No other acute findings in the chest.

## 2021-03-18 ENCOUNTER — Ambulatory Visit
Admission: RE | Admit: 2021-03-18 | Discharge: 2021-03-18 | Disposition: A | Discharge status: Home / Self Care | Payer: Medicare Other | Source: Ambulatory Visit | Attending: Internal Medicine | Admitting: Internal Medicine

## 2021-03-18 ENCOUNTER — Other Ambulatory Visit: Payer: Self-pay

## 2021-03-18 DIAGNOSIS — Z1231 Encounter for screening mammogram for malignant neoplasm of breast: Secondary | ICD-10-CM | POA: Diagnosis present

## 2021-05-29 ENCOUNTER — Encounter: Payer: Self-pay | Admitting: Ophthalmology

## 2021-05-29 ENCOUNTER — Other Ambulatory Visit: Payer: Self-pay

## 2021-06-10 ENCOUNTER — Encounter: Payer: Self-pay | Admitting: Ophthalmology

## 2021-06-10 ENCOUNTER — Other Ambulatory Visit: Payer: Self-pay

## 2021-06-10 ENCOUNTER — Ambulatory Visit: Payer: Medicare Other | Admitting: Anesthesiology

## 2021-06-10 ENCOUNTER — Ambulatory Visit
Admission: RE | Admit: 2021-06-10 | Discharge: 2021-06-10 | Disposition: A | Discharge status: Home / Self Care | Payer: Medicare Other | Attending: Ophthalmology | Admitting: Ophthalmology

## 2021-06-10 ENCOUNTER — Encounter
Admission: RE | Disposition: A | Discharge status: Home / Self Care | Payer: Self-pay | Source: Home / Self Care | Attending: Ophthalmology

## 2021-06-10 DIAGNOSIS — H2512 Age-related nuclear cataract, left eye: Secondary | ICD-10-CM | POA: Insufficient documentation

## 2021-06-10 DIAGNOSIS — Z79899 Other long term (current) drug therapy: Secondary | ICD-10-CM | POA: Diagnosis not present

## 2021-06-10 DIAGNOSIS — Z79890 Hormone replacement therapy (postmenopausal): Secondary | ICD-10-CM | POA: Diagnosis not present

## 2021-06-10 DIAGNOSIS — Z8616 Personal history of COVID-19: Secondary | ICD-10-CM | POA: Diagnosis not present

## 2021-06-10 HISTORY — PX: CATARACT EXTRACTION W/PHACO: SHX586

## 2021-06-10 SURGERY — PHACOEMULSIFICATION, CATARACT, WITH IOL INSERTION
Anesthesia: Monitor Anesthesia Care | Site: Eye | Laterality: Left

## 2021-06-10 MED ORDER — ACETAMINOPHEN 325 MG PO TABS
325.0000 mg | ORAL_TABLET | ORAL | Status: DC | PRN
Start: 2021-06-10 — End: 2021-06-10

## 2021-06-10 MED ORDER — LIDOCAINE HCL (PF) 2 % IJ SOLN
INTRAOCULAR | Status: DC | PRN
  Administered 2021-06-10: 1 mL

## 2021-06-10 MED ORDER — LACTATED RINGERS IV SOLN: INTRAVENOUS | Status: DC

## 2021-06-10 MED ORDER — TETRACAINE HCL 0.5 % OP SOLN
1.0000 [drp] | OPHTHALMIC | Status: DC | PRN
  Administered 2021-06-10 (×3): 1 [drp] via OPHTHALMIC

## 2021-06-10 MED ORDER — EPINEPHRINE PF 1 MG/ML IJ SOLN
INTRAOCULAR | Status: DC | PRN
  Administered 2021-06-10: 58 mL via OPHTHALMIC

## 2021-06-10 MED ORDER — MIDAZOLAM HCL 2 MG/2ML IJ SOLN
INTRAMUSCULAR | Status: DC | PRN
  Administered 2021-06-10: 2 mg via INTRAVENOUS

## 2021-06-10 MED ORDER — CEFUROXIME OPHTHALMIC INJECTION 1 MG/0.1 ML
INJECTION | OPHTHALMIC | Status: DC | PRN
  Administered 2021-06-10: 0.1 mL via INTRACAMERAL

## 2021-06-10 MED ORDER — BRIMONIDINE TARTRATE-TIMOLOL 0.2-0.5 % OP SOLN
OPHTHALMIC | Status: DC | PRN
  Administered 2021-06-10: 1 [drp] via OPHTHALMIC

## 2021-06-10 MED ORDER — ACETAMINOPHEN 160 MG/5ML PO SOLN: 325.0000 mg | ORAL | Status: DC | PRN

## 2021-06-10 MED ORDER — FENTANYL CITRATE (PF) 100 MCG/2ML IJ SOLN
INTRAMUSCULAR | Status: DC | PRN
  Administered 2021-06-10: 100 ug via INTRAVENOUS

## 2021-06-10 MED ORDER — ARMC OPHTHALMIC DILATING DROPS
1.0000 "application " | OPHTHALMIC | Status: DC | PRN
  Administered 2021-06-10 (×3): 1 via OPHTHALMIC

## 2021-06-10 MED ORDER — NEOMYCIN-POLYMYXIN-DEXAMETH 3.5-10000-0.1 OP OINT
TOPICAL_OINTMENT | OPHTHALMIC | Status: DC | PRN
  Administered 2021-06-10: 1 via OPHTHALMIC

## 2021-06-10 MED ORDER — NA HYALUR & NA CHOND-NA HYALUR 0.4-0.35 ML IO KIT
PACK | INTRAOCULAR | Status: DC | PRN
  Administered 2021-06-10: 1 mL via INTRAOCULAR

## 2021-06-10 SURGICAL SUPPLY — 24 items
CANNULA ANT/CHMB 27GA (MISCELLANEOUS) ×3 IMPLANT
GLOVE SURG ENC TEXT LTX SZ7.5 (GLOVE) ×6 IMPLANT
GLOVE SURG TRIUMPH 8.0 PF LTX (GLOVE) ×3 IMPLANT
GOWN STRL REUS W/ TWL LRG LVL3 (GOWN DISPOSABLE) ×2 IMPLANT
GOWN STRL REUS W/TWL LRG LVL3 (GOWN DISPOSABLE) ×6
LENS IOL TECNIS EYHANCE 21.5 (Intraocular Lens) ×3 IMPLANT
MARKER SKIN DUAL TIP RULER LAB (MISCELLANEOUS) ×3 IMPLANT
NDL RETROBULBAR .5 NSTRL (NEEDLE) IMPLANT
NEEDLE CAPSULORHEX 25GA (NEEDLE) ×3 IMPLANT
NEEDLE FILTER BLUNT 18X 1/2SAF (NEEDLE) ×4
NEEDLE FILTER BLUNT 18X1 1/2 (NEEDLE) ×2 IMPLANT
PACK CATARACT BRASINGTON (MISCELLANEOUS) ×3 IMPLANT
PACK EYE AFTER SURG (MISCELLANEOUS) ×3 IMPLANT
PACK OPTHALMIC (MISCELLANEOUS) ×3 IMPLANT
RING MALYGIN 7.0 (MISCELLANEOUS) IMPLANT
SOLUTION OPHTHALMIC SALT (MISCELLANEOUS) ×3 IMPLANT
SUT ETHILON 10-0 CS-B-6CS-B-6 (SUTURE)
SUT VICRYL  9 0 (SUTURE)
SUT VICRYL 9 0 (SUTURE) IMPLANT
SUTURE EHLN 10-0 CS-B-6CS-B-6 (SUTURE) IMPLANT
SYR 3ML LL SCALE MARK (SYRINGE) ×6 IMPLANT
SYR TB 1ML LUER SLIP (SYRINGE) ×3 IMPLANT
WATER STERILE IRR 250ML POUR (IV SOLUTION) ×3 IMPLANT
WIPE NON LINTING 3.25X3.25 (MISCELLANEOUS) ×3 IMPLANT

## 2021-06-10 NOTE — Op Note (Signed)
  OPERATIVE NOTE  Jody Brewer [id number] 06/10/2021   PREOPERATIVE DIAGNOSIS:  Nuclear sclerotic cataract left eye. H25.12   POSTOPERATIVE DIAGNOSIS:    Nuclear sclerotic cataract left eye.     PROCEDURE:  Phacoemusification with posterior chamber intraocular lens placement of the left eye  Ultrasound time: Procedure(s) with comments: CATARACT EXTRACTION PHACO AND INTRAOCULAR LENS PLACEMENT (IOC) LEFT (Left) - 5.69 0:53.6 10.6%  LENS:   Implant Name Type Inv. Item Serial No. Manufacturer Lot No. LRB No. Used Action  LENS IOL TECNIS EYHANCE 21.5 - [id number] Intraocular Lens LENS IOL TECNIS EYHANCE 21.5 [id number] JOHNSON   Left 1 Implanted      SURGEON:  Deirdre Evener, MD   ANESTHESIA:  Topical with tetracaine drops and 2% Xylocaine jelly, augmented with 1% preservative-free intracameral lidocaine.    COMPLICATIONS:  None.   DESCRIPTION OF PROCEDURE:  The patient was identified in the holding room and transported to the operating room and placed in the supine position under the operating microscope.  The left eye was identified as the operative eye and it was prepped and draped in the usual sterile ophthalmic fashion.   A 1 millimeter clear-corneal paracentesis was made at the 1:30 position.  0.5 ml of preservative-free 1% lidocaine was injected into the anterior chamber.  The anterior chamber was filled with Viscoat viscoelastic.  A 2.4 millimeter keratome was used to make a near-clear corneal incision at the 10:30 position.  .  A curvilinear capsulorrhexis was made with a cystotome and capsulorrhexis forceps.  Balanced salt solution was used to hydrodissect and hydrodelineate the nucleus.   Phacoemulsification was then used in stop and chop fashion to remove the lens nucleus and epinucleus.  The remaining cortex was then removed using the irrigation and aspiration handpiece. Provisc was then placed into the capsular bag to distend it for lens placement.  A lens was  then injected into the capsular bag.  The remaining viscoelastic was aspirated.   Wounds were hydrated with balanced salt solution.  The anterior chamber was inflated to a physiologic pressure with balanced salt solution.  No wound leaks were noted. Cefuroxime 0.1 ml of a 10mg /ml solution was injected into the anterior chamber for a dose of 1 mg of intracameral antibiotic at the completion of the case.   Timolol and Brimonidine drops and maxitrol ointment were applied to the eye.  The patient was taken to the recovery room in stable condition without complications of anesthesia or surgery.  Jody Brewer 06/10/2021, 10:57 AM

## 2021-06-10 NOTE — H&P (Signed)
[facility]   Primary Care Physician:  Danella Penton, MD Ophthalmologist: Dr. Lockie Mola  Pre-Procedure History & Physical: HPI:  Jody Brewer is a 70 y.o. female here for ophthalmic surgery.   Past Medical History:  Diagnosis Date   Asthma    COVID-19    Hypertension     Past Surgical History:  Procedure Laterality Date   ABDOMINAL HYSTERECTOMY     APPENDECTOMY     CHOLECYSTECTOMY     OOPHORECTOMY     1 ovary removed    Prior to Admission medications   Medication Sig Start Date End Date Taking? Authorizing Provider  Ascorbic Acid (VITAMIN C) 500 MG CHEW Chew 500 mg by mouth daily.   Yes [provider]  cetirizine (ZYRTEC) 10 MG tablet Take 10 mg by mouth daily.   Yes [provider]  cholecalciferol (VITAMIN D3) 25 MCG (1000 UT) tablet Take 1,000 Units by mouth 2 (two) times daily.   Yes [provider]  estradiol (ESTRACE) 0.5 MG tablet Take 0.5 mg by mouth daily. 10/15/19  Yes [provider]  nortriptyline (PAMELOR) 25 MG capsule Take 25 mg by mouth at bedtime. 11/26/19  Yes [provider]  olmesartan-hydrochlorothiazide (BENICAR HCT) 20-12.5 MG tablet Take 1 tablet by mouth daily. 11/17/19  Yes [provider]  pantoprazole (PROTONIX) 40 MG tablet Take 40 mg by mouth 2 (two) times daily. 10/15/19  Yes [provider]  pravastatin (PRAVACHOL) 20 MG tablet Take 20 mg by mouth daily.   Yes [provider]  venlafaxine XR (EFFEXOR-XR) 75 MG 24 hr capsule Take 75 mg by mouth daily. 10/15/19  Yes [provider]  vitamin E 400 UNIT capsule Take 400 Units by mouth daily.   Yes [provider]  albuterol (VENTOLIN HFA) 108 (90 Base) MCG/ACT inhaler Inhale 2 puffs into the lungs See admin instructions. Inhale 2 puffs into the lungs every three to four hours as needed for shortness of breath or wheezing    [provider]  atorvastatin (LIPITOR) 20 MG tablet  Take 1 tablet (20 mg total) by mouth daily. Patient not taking: Reported on 05/29/2021 01/17/20 02/16/20  Hughie Closs, MD  chlorpheniramine-HYDROcodone (TUSSIONEX) 10-8 MG/5ML SUER Take 5 mLs by mouth every 12 (twelve) hours as needed for cough. 01/11/20   Enedina Finner, MD  dexamethasone (DECADRON) 6 MG tablet Take 1 tablet (6 mg total) by mouth daily. Patient not taking: Reported on 05/29/2021 01/11/20   Enedina Finner, MD  guaiFENesin-dextromethorphan [facility] DM) 100-10 MG/5ML syrup Take 10 mLs by mouth every 4 (four) hours as needed for cough. Patient not taking: Reported on 01/15/2020 01/11/20   Enedina Finner, MD  montelukast (SINGULAIR) 10 MG tablet Take 10 mg by mouth every evening.  Patient not taking: Reported on 05/29/2021 10/15/19   [provider]  Multiple Vitamin (MULTI-VITAMIN) tablet Take 1 tablet by mouth daily.    [provider]  nitroGLYCERIN (NITROSTAT) 0.4 MG SL tablet Place 1 tablet (0.4 mg total) under the tongue every 5 (five) minutes as needed for chest pain. Patient not taking: Reported on 05/29/2021 01/17/20 02/16/20  Hughie Closs, MD  zinc gluconate 50 MG tablet Take 50 mg by mouth daily. Patient not taking: Reported on 05/29/2021    [provider]    Allergies as of 03/27/2021   (No Known Allergies)    Family History  Problem Relation Age of Onset   Emphysema Mother    Parkinson's disease Father  Dementia Father    Hypertension Father     Social History   Socioeconomic History   Marital status: Married    Spouse name: Not on file   Number of children: Not on file   Years of education: Not on file   Highest education level: Not on file  Occupational History   Not on file  Tobacco Use   Smoking status: Never   Smokeless tobacco: Never  Substance and Sexual Activity   Alcohol use: Not Currently   Drug use: Not Currently   Sexual activity: Not on file  Other Topics Concern   Not on file  Social History Narrative   Not on file    Social Determinants of Health   Financial Resource Strain: Not on file  Food Insecurity: Not on file  Transportation Needs: Not on file  Physical Activity: Not on file  Stress: Not on file  Social Connections: Not on file  Intimate Partner Violence: Not on file    Review of Systems: See HPI, otherwise negative ROS  Physical Exam: BP (!) 126/58   Pulse 78   Temp 97.8 F (36.6 C) (Temporal)   Resp 18   Ht 5\' 7"  (1.702 m)   Wt 77.1 kg   SpO2 96%   BMI 26.63 kg/m  General:   Alert,  pleasant and cooperative in NAD Head:  Normocephalic and atraumatic. Lungs:  Clear to auscultation.    Heart:  Regular rate and rhythm.   Impression/Plan: Jody Brewer is here for ophthalmic surgery.  Risks, benefits, limitations, and alternatives regarding ophthalmic surgery have been reviewed with the patient.  Questions have been answered.  All parties agreeable.   [name], MD  06/10/2021, 10:13 AM

## 2021-06-10 NOTE — Anesthesia Preprocedure Evaluation (Signed)
Anesthesia Evaluation  Patient identified by MRN, date of birth, ID band Patient awake    Reviewed: Allergy & Precautions, H&P , NPO status , Patient's Chart, lab work & pertinent test results, reviewed documented beta blocker date and time   Airway Mallampati: II  TM Distance: >3 FB Neck ROM: full    Dental no notable dental hx.    Pulmonary asthma ,    Pulmonary exam normal breath sounds clear to auscultation       Cardiovascular Exercise Tolerance: Good hypertension, Normal cardiovascular exam Rhythm:regular Rate:Normal     Neuro/Psych negative neurological ROS  negative psych ROS   GI/Hepatic negative GI ROS, Neg liver ROS,   Endo/Other  negative endocrine ROS  Renal/GU negative Renal ROS  negative genitourinary   Musculoskeletal   Abdominal   Peds  Hematology negative hematology ROS (+)   Anesthesia Other Findings   Reproductive/Obstetrics negative OB ROS                             Anesthesia Physical Anesthesia Plan  ASA: 2  Anesthesia Plan: MAC   Post-op Pain Management:    Induction:   PONV Risk Score and Plan:   Airway Management Planned:   Additional Equipment:   Intra-op Plan:   Post-operative Plan:   Informed Consent: I have reviewed the patients History and Physical, chart, labs and discussed the procedure including the risks, benefits and alternatives for the proposed anesthesia with the patient or authorized representative who has indicated his/her understanding and acceptance.     Dental Advisory Given  Plan Discussed with: CRNA and Anesthesiologist  Anesthesia Plan Comments:         Anesthesia Quick Evaluation

## 2021-06-10 NOTE — Anesthesia Postprocedure Evaluation (Signed)
Anesthesia Post Note  Patient: Jody Brewer  Procedure(s) Performed: CATARACT EXTRACTION PHACO AND INTRAOCULAR LENS PLACEMENT (IOC) LEFT (Left: Eye)     Patient location during evaluation: PACU Anesthesia Type: MAC Level of consciousness: awake and alert Pain management: pain level controlled Vital Signs Assessment: post-procedure vital signs reviewed and stable Respiratory status: spontaneous breathing, nonlabored ventilation, respiratory function stable and patient connected to nasal cannula oxygen Cardiovascular status: stable and blood pressure returned to baseline Postop Assessment: no apparent nausea or vomiting Anesthetic complications: no   No notable events documented.  Trecia Rogers

## 2021-06-10 NOTE — Transfer of Care (Addendum)
Immediate Anesthesia Transfer of Care Note  Patient: Jody Brewer  Procedure(s) Performed: CATARACT EXTRACTION PHACO AND INTRAOCULAR LENS PLACEMENT (IOC) LEFT (Left: Eye)  Patient Location: PACU  Anesthesia Type: MAC  Level of Consciousness: awake, alert  and patient cooperative  Airway and Oxygen Therapy: Patient Spontanous Breathing and Patient connected to supplemental oxygen  Post-op Assessment: Post-op Vital signs reviewed, Patient's Cardiovascular Status Stable, Respiratory Function Stable, Patent Airway and No signs of Nausea or vomiting  Post-op Vital Signs: Reviewed and stable  Complications: No notable events documented.

## 2021-06-11 ENCOUNTER — Encounter: Payer: Self-pay | Admitting: Ophthalmology

## 2021-06-24 ENCOUNTER — Ambulatory Visit: Payer: Medicare Other | Admitting: Anesthesiology

## 2021-06-24 ENCOUNTER — Encounter: Payer: Self-pay | Admitting: Ophthalmology

## 2021-06-24 ENCOUNTER — Encounter
Admission: RE | Disposition: A | Discharge status: Home / Self Care | Payer: Self-pay | Source: Home / Self Care | Attending: Ophthalmology

## 2021-06-24 ENCOUNTER — Other Ambulatory Visit: Payer: Self-pay

## 2021-06-24 ENCOUNTER — Ambulatory Visit
Admission: RE | Admit: 2021-06-24 | Discharge: 2021-06-24 | Disposition: A | Discharge status: Home / Self Care | Payer: Medicare Other | Attending: Ophthalmology | Admitting: Ophthalmology

## 2021-06-24 DIAGNOSIS — Z79890 Hormone replacement therapy (postmenopausal): Secondary | ICD-10-CM | POA: Diagnosis not present

## 2021-06-24 DIAGNOSIS — Z79899 Other long term (current) drug therapy: Secondary | ICD-10-CM | POA: Diagnosis not present

## 2021-06-24 DIAGNOSIS — Z9842 Cataract extraction status, left eye: Secondary | ICD-10-CM | POA: Diagnosis not present

## 2021-06-24 DIAGNOSIS — H2511 Age-related nuclear cataract, right eye: Secondary | ICD-10-CM | POA: Diagnosis not present

## 2021-06-24 DIAGNOSIS — Z8616 Personal history of COVID-19: Secondary | ICD-10-CM | POA: Diagnosis not present

## 2021-06-24 DIAGNOSIS — Z961 Presence of intraocular lens: Secondary | ICD-10-CM | POA: Diagnosis not present

## 2021-06-24 HISTORY — PX: CATARACT EXTRACTION W/PHACO: SHX586

## 2021-06-24 SURGERY — PHACOEMULSIFICATION, CATARACT, WITH IOL INSERTION
Anesthesia: Monitor Anesthesia Care | Site: Eye | Laterality: Right

## 2021-06-24 MED ORDER — BRIMONIDINE TARTRATE-TIMOLOL 0.2-0.5 % OP SOLN
OPHTHALMIC | Status: DC | PRN
  Administered 2021-06-24: 1 [drp] via OPHTHALMIC

## 2021-06-24 MED ORDER — LIDOCAINE HCL (PF) 2 % IJ SOLN
INTRAOCULAR | Status: DC | PRN
  Administered 2021-06-24: 1 mL via INTRAMUSCULAR

## 2021-06-24 MED ORDER — OXYCODONE HCL 5 MG PO TABS
5.0000 mg | ORAL_TABLET | Freq: Once | ORAL | Status: DC | PRN
Start: 2021-06-24 — End: 2021-06-24

## 2021-06-24 MED ORDER — OXYCODONE HCL 5 MG/5ML PO SOLN: 5.0000 mg | Freq: Once | ORAL | Status: DC | PRN

## 2021-06-24 MED ORDER — FENTANYL CITRATE (PF) 100 MCG/2ML IJ SOLN
INTRAMUSCULAR | Status: DC | PRN
  Administered 2021-06-24 (×2): 50 ug via INTRAVENOUS

## 2021-06-24 MED ORDER — CYCLOPENTOLATE HCL 2 % OP SOLN
1.0000 [drp] | OPHTHALMIC | Status: DC | PRN
  Administered 2021-06-24 (×3): 1 [drp] via OPHTHALMIC

## 2021-06-24 MED ORDER — SIGHTPATH DOSE#1 NA HYALUR & NA CHOND-NA HYALUR IO KIT
PACK | INTRAOCULAR | Status: DC | PRN
  Administered 2021-06-24: 1 via OPHTHALMIC

## 2021-06-24 MED ORDER — CEFUROXIME OPHTHALMIC INJECTION 1 MG/0.1 ML
INJECTION | OPHTHALMIC | Status: DC | PRN
  Administered 2021-06-24: 0.1 mL via OPHTHALMIC

## 2021-06-24 MED ORDER — PHENYLEPHRINE HCL 10 % OP SOLN
1.0000 [drp] | OPHTHALMIC | Status: DC | PRN
  Administered 2021-06-24 (×3): 1 [drp] via OPHTHALMIC

## 2021-06-24 MED ORDER — MIDAZOLAM HCL 2 MG/2ML IJ SOLN
INTRAMUSCULAR | Status: DC | PRN
  Administered 2021-06-24 (×2): 1 mg via INTRAVENOUS

## 2021-06-24 MED ORDER — TETRACAINE HCL 0.5 % OP SOLN
1.0000 [drp] | OPHTHALMIC | Status: DC | PRN
  Administered 2021-06-24 (×3): 1 [drp] via OPHTHALMIC

## 2021-06-24 MED ORDER — SIGHTPATH DOSE#1 BSS IO SOLN
INTRAOCULAR | Status: DC | PRN
  Administered 2021-06-24: 82 mL via OPHTHALMIC

## 2021-06-24 MED ORDER — LACTATED RINGERS IV SOLN: INTRAVENOUS | Status: DC

## 2021-06-24 SURGICAL SUPPLY — 16 items
CANNULA ANT/CHMB 27GA (MISCELLANEOUS) ×3 IMPLANT
GLOVE SURG ENC TEXT LTX SZ7.5 (GLOVE) ×3 IMPLANT
GLOVE SURG TRIUMPH 8.0 PF LTX (GLOVE) ×3 IMPLANT
GOWN STRL REUS W/ TWL LRG LVL3 (GOWN DISPOSABLE) ×2 IMPLANT
GOWN STRL REUS W/TWL LRG LVL3 (GOWN DISPOSABLE) ×6
LENS IOL TECNIS EYHANCE 22.0 (Intraocular Lens) ×3 IMPLANT
MARKER SKIN DUAL TIP RULER LAB (MISCELLANEOUS) ×3 IMPLANT
NEEDLE CAPSULORHEX 25GA (NEEDLE) ×3 IMPLANT
NEEDLE FILTER BLUNT 18X 1/2SAF (NEEDLE) ×4
NEEDLE FILTER BLUNT 18X1 1/2 (NEEDLE) ×2 IMPLANT
PACK EYE AFTER SURG (MISCELLANEOUS) ×3 IMPLANT
SYR 3ML LL SCALE MARK (SYRINGE) ×6 IMPLANT
SYR TB 1ML LUER SLIP (SYRINGE) ×3 IMPLANT
TIP ITREPID SGL USE BENT I/A (SUCTIONS) ×3 IMPLANT
WATER STERILE IRR 250ML POUR (IV SOLUTION) ×3 IMPLANT
WIPE NON LINTING 3.25X3.25 (MISCELLANEOUS) ×3 IMPLANT

## 2021-06-24 NOTE — Anesthesia Preprocedure Evaluation (Signed)
Anesthesia Evaluation  Patient identified by MRN, date of birth, ID band Patient awake    Reviewed: NPO status   History of Anesthesia Complications Negative for: history of anesthetic complications  Airway Mallampati: II  TM Distance: >3 FB Neck ROM: full    Dental no notable dental hx.    Pulmonary asthma (mild) , sleep apnea and Continuous Positive Airway Pressure Ventilation , COPD (mild),  Covid+ 11/2019   Pulmonary exam normal        Cardiovascular Exercise Tolerance: Good hypertension, Normal cardiovascular exam  echo: 12/2019: Left ventricular ejection fraction, by visual estimation, is 65 to  70%. The left ventricle has hyperdynamic function. There is no left  ventricular hypertrophy.;  ekg: 12/2019: nsr;   Neuro/Psych  Headaches, Anxiety    GI/Hepatic Neg liver ROS, GERD  Controlled,  Endo/Other  negative endocrine ROS  Renal/GU negative Renal ROS  negative genitourinary   Musculoskeletal   Abdominal   Peds  Hematology negative hematology ROS (+)   Anesthesia Other Findings pcp:  Yevonne Pax, MD at 05/27/2021 ;   had ecce on 06/10/2021;  Reproductive/Obstetrics                             Anesthesia Physical Anesthesia Plan  ASA: 2  Anesthesia Plan: MAC   Post-op Pain Management:    Induction:   PONV Risk Score and Plan: 2 and Midazolam and TIVA  Airway Management Planned:   Additional Equipment:   Intra-op Plan:   Post-operative Plan:   Informed Consent: I have reviewed the patients History and Physical, chart, labs and discussed the procedure including the risks, benefits and alternatives for the proposed anesthesia with the patient or authorized representative who has indicated his/her understanding and acceptance.       Plan Discussed with: CRNA  Anesthesia Plan Comments:         Anesthesia Quick Evaluation

## 2021-06-24 NOTE — H&P (Signed)
[facility]   Primary Care Physician:  Danella Penton, MD Ophthalmologist: Dr. Lockie Mola  Pre-Procedure History & Physical: HPI:  Jody Brewer is a 70 y.o. female here for ophthalmic surgery.   Past Medical History:  Diagnosis Date   Asthma    COVID-19    Hypertension     Past Surgical History:  Procedure Laterality Date   ABDOMINAL HYSTERECTOMY     APPENDECTOMY     CATARACT EXTRACTION W/PHACO Left 06/10/2021   Procedure: CATARACT EXTRACTION PHACO AND INTRAOCULAR LENS PLACEMENT (IOC) LEFT;  Surgeon: Lockie Mola, MD;  Location: [facility];  Service: Ophthalmology;  Laterality: Left;  5.69 0:53.6 10.6%   CHOLECYSTECTOMY     OOPHORECTOMY     1 ovary removed    Prior to Admission medications   Medication Sig Start Date End Date Taking? Authorizing Provider  albuterol (VENTOLIN HFA) 108 (90 Base) MCG/ACT inhaler Inhale 2 puffs into the lungs See admin instructions. Inhale 2 puffs into the lungs every three to four hours as needed for shortness of breath or wheezing   Yes [provider]  Ascorbic Acid (VITAMIN C) 500 MG CHEW Chew 500 mg by mouth daily.   Yes [provider]  cetirizine (ZYRTEC) 10 MG tablet Take 10 mg by mouth daily.   Yes [provider]  cholecalciferol (VITAMIN D3) 25 MCG (1000 UT) tablet Take 1,000 Units by mouth 2 (two) times daily.   Yes [provider]  estradiol (ESTRACE) 0.5 MG tablet Take 0.5 mg by mouth daily. 10/15/19  Yes [provider]  gabapentin (NEURONTIN) 100 MG capsule Take 200 mg by mouth at bedtime.   Yes [provider]  Multiple Vitamin (MULTI-VITAMIN) tablet Take 1 tablet by mouth daily.   Yes [provider]  nortriptyline (PAMELOR) 25 MG capsule Take 25 mg by mouth at bedtime. 11/26/19  Yes [provider]  olmesartan-hydrochlorothiazide (BENICAR HCT) 20-12.5 MG tablet Take 1 tablet by mouth daily. 11/17/19  Yes [provider]  pantoprazole (PROTONIX) 40 MG tablet Take 40 mg by mouth 2 (two) times daily. 10/15/19  Yes [provider]  pravastatin (PRAVACHOL) 20 MG tablet Take 20 mg by mouth daily.   Yes [provider]  venlafaxine XR (EFFEXOR-XR) 75 MG 24 hr capsule Take 75 mg by mouth daily. 10/15/19  Yes [provider]  vitamin E 400 UNIT capsule Take 400 Units by mouth daily.   Yes [provider]  atorvastatin (LIPITOR) 20 MG tablet Take 1 tablet (20 mg total) by mouth daily. Patient not taking: Reported on 05/29/2021 01/17/20 02/16/20  Hughie Closs, MD  chlorpheniramine-HYDROcodone (TUSSIONEX) 10-8 MG/5ML SUER Take 5 mLs by mouth every 12 (twelve) hours as needed for cough. 01/11/20   Enedina Finner, MD  dexamethasone (DECADRON) 6 MG tablet Take 1 tablet (6 mg total) by mouth daily. Patient not taking: Reported on 05/29/2021 01/11/20   Enedina Finner, MD  guaiFENesin-dextromethorphan [facility] DM) 100-10 MG/5ML syrup Take 10 mLs by mouth every 4 (four) hours as needed for cough. Patient not taking: Reported on 01/15/2020 01/11/20   Enedina Finner, MD    Allergies as of 03/27/2021   (No Known Allergies)    Family History  Problem Relation Age of Onset   Emphysema Mother    Parkinson's disease Father    Dementia Father    Hypertension Father     Social History   Socioeconomic History   Marital status: Married    Spouse name: Not  on file   Number of children: Not on file   Years of education: Not on file   Highest education level: Not on file  Occupational History   Not on file  Tobacco Use   Smoking status: Never   Smokeless tobacco: Never  Substance and Sexual Activity   Alcohol use: Not Currently   Drug use: Not Currently   Sexual activity: Not on file  Other Topics Concern   Not on file  Social History Narrative   Not on file   Social Determinants of Health   Financial Resource Strain: Not on file  Food Insecurity: Not on file  Transportation  Needs: Not on file  Physical Activity: Not on file  Stress: Not on file  Social Connections: Not on file  Intimate Partner Violence: Not on file    Review of Systems: See HPI, otherwise negative ROS  Physical Exam: BP 124/68   Pulse 81   Temp (!) 97.5 F (36.4 C) (Temporal)   Resp 18   Ht 5\' 7"  (1.702 m)   Wt 80.3 kg   SpO2 97%   BMI 27.72 kg/m  General:   Alert,  pleasant and cooperative in NAD Head:  Normocephalic and atraumatic. Lungs:  Clear to auscultation.    Heart:  Regular rate and rhythm.   Impression/Plan: Jody Brewer is here for ophthalmic surgery.  Risks, benefits, limitations, and alternatives regarding ophthalmic surgery have been reviewed with the patient.  Questions have been answered.  All parties agreeable.   [name], MD  06/24/2021, 8:35 AM

## 2021-06-24 NOTE — Op Note (Signed)
  LOCATION:  [facility]   PREOPERATIVE DIAGNOSIS:    Nuclear sclerotic cataract right eye. H25.11   POSTOPERATIVE DIAGNOSIS:  Nuclear sclerotic cataract right eye.     PROCEDURE:  Phacoemusification with posterior chamber intraocular lens placement of the right eye   ULTRASOUND TIME: Procedure(s) with comments: CATARACT EXTRACTION PHACO AND INTRAOCULAR LENS PLACEMENT (IOC) RIGHT (Right) - 3.61 00:52.4  LENS:   Implant Name Type Inv. Item Serial No. Manufacturer Lot No. LRB No. Used Action  LENS IOL TECNIS EYHANCE 22.0 - [id number] Intraocular Lens LENS IOL TECNIS EYHANCE 22.0 [id number] [id number]   Right 1 Implanted         SURGEON:  Deirdre Evener, MD   ANESTHESIA:  Topical with tetracaine drops and 2% Xylocaine jelly, augmented with 1% preservative-free intracameral lidocaine.    COMPLICATIONS:  None.   DESCRIPTION OF PROCEDURE:  The patient was identified in the holding room and transported to the operating room and placed in the supine position under the operating microscope.  The right eye was identified as the operative eye and it was prepped and draped in the usual sterile ophthalmic fashion.   A 1 millimeter clear-corneal paracentesis was made at the 12:00 position.  0.5 ml of preservative-free 1% lidocaine was injected into the anterior chamber. The anterior chamber was filled with Viscoat viscoelastic.  A 2.4 millimeter keratome was used to make a near-clear corneal incision at the 9:00 position.  A curvilinear capsulorrhexis was made with a cystotome and capsulorrhexis forceps.  Balanced salt solution was used to hydrodissect and hydrodelineate the nucleus.   Phacoemulsification was then used in stop and chop fashion to remove the lens nucleus and epinucleus.  The remaining cortex was then removed using the irrigation and aspiration handpiece. Provisc was then placed into the capsular bag to distend it for lens placement.  A lens was then injected into the  capsular bag.  The remaining viscoelastic was aspirated.   Wounds were hydrated with balanced salt solution.  The anterior chamber was inflated to a physiologic pressure with balanced salt solution.  No wound leaks were noted. Cefuroxime 0.1 ml of a 10mg /ml solution was injected into the anterior chamber for a dose of 1 mg of intracameral antibiotic at the completion of the case.   Timolol and Brimonidine drops were applied to the eye.  The patient was taken to the recovery room in stable condition without complications of anesthesia or surgery.   Fayne Mcguffee 06/24/2021, 9:52 AM

## 2021-06-24 NOTE — Anesthesia Postprocedure Evaluation (Signed)
Anesthesia Post Note  Patient: Jody Brewer  Procedure(s) Performed: CATARACT EXTRACTION PHACO AND INTRAOCULAR LENS PLACEMENT (IOC) RIGHT (Right: Eye)     Patient location during evaluation: PACU Anesthesia Type: MAC Level of consciousness: awake and alert Pain management: pain level controlled Vital Signs Assessment: post-procedure vital signs reviewed and stable Respiratory status: spontaneous breathing, nonlabored ventilation, respiratory function stable and patient connected to nasal cannula oxygen Cardiovascular status: stable and blood pressure returned to baseline Postop Assessment: no apparent nausea or vomiting Anesthetic complications: no   No notable events documented.  Fidel Levy

## 2021-06-24 NOTE — Transfer of Care (Signed)
Immediate Anesthesia Transfer of Care Note  Patient: Jody Brewer  Procedure(s) Performed: CATARACT EXTRACTION PHACO AND INTRAOCULAR LENS PLACEMENT (IOC) RIGHT (Right: Eye)  Patient Location: PACU  Anesthesia Type: MAC  Level of Consciousness: awake, alert  and patient cooperative  Airway and Oxygen Therapy: Patient Spontanous Breathing   Post-op Assessment: Post-op Vital signs reviewed, Patient's Cardiovascular Status Stable, Respiratory Function Stable, Patent Airway and No signs of Nausea or vomiting  Post-op Vital Signs: Reviewed and stable  Complications: No notable events documented.

## 2021-06-24 NOTE — Anesthesia Procedure Notes (Signed)
Procedure Name: MAC Date/Time: 06/24/2021 9:34 AM Performed by: Vanetta Shawl, CRNA Pre-anesthesia Checklist: Patient identified, Emergency Drugs available, Suction available, Timeout performed and Patient being monitored Patient Re-evaluated:Patient Re-evaluated prior to induction Oxygen Delivery Method: Nasal cannula Placement Confirmation: positive ETCO2

## 2021-07-01 ENCOUNTER — Encounter: Payer: Self-pay | Admitting: Ophthalmology

## 2021-08-25 ENCOUNTER — Other Ambulatory Visit: Payer: Self-pay | Admitting: Student

## 2021-08-25 DIAGNOSIS — M1711 Unilateral primary osteoarthritis, right knee: Secondary | ICD-10-CM

## 2021-08-25 DIAGNOSIS — M25561 Pain in right knee: Secondary | ICD-10-CM

## 2021-09-05 ENCOUNTER — Other Ambulatory Visit: Payer: Self-pay

## 2021-09-05 ENCOUNTER — Ambulatory Visit
Admission: RE | Admit: 2021-09-05 | Discharge: 2021-09-05 | Disposition: A | Discharge status: Home / Self Care | Payer: Medicare Other | Source: Ambulatory Visit | Attending: Student | Admitting: Student

## 2021-09-05 DIAGNOSIS — M1711 Unilateral primary osteoarthritis, right knee: Secondary | ICD-10-CM | POA: Diagnosis not present

## 2021-09-05 DIAGNOSIS — M25561 Pain in right knee: Secondary | ICD-10-CM | POA: Insufficient documentation

## 2021-10-30 ENCOUNTER — Other Ambulatory Visit: Payer: Self-pay | Admitting: Surgery

## 2021-11-04 ENCOUNTER — Encounter: Payer: Self-pay | Admitting: Ophthalmology

## 2021-11-05 ENCOUNTER — Other Ambulatory Visit: Payer: Self-pay

## 2021-11-05 ENCOUNTER — Encounter
Admission: RE | Admit: 2021-11-05 | Discharge: 2021-11-05 | Disposition: A | Discharge status: Home / Self Care | Payer: Medicare Other | Source: Ambulatory Visit | Attending: Surgery | Admitting: Surgery

## 2021-11-05 VITALS — BP 112/82 | HR 75 | Resp 15 | Ht 67.0 in | Wt 180.0 lb

## 2021-11-05 DIAGNOSIS — Z01818 Encounter for other preprocedural examination: Secondary | ICD-10-CM | POA: Diagnosis not present

## 2021-11-05 DIAGNOSIS — Z01812 Encounter for preprocedural laboratory examination: Secondary | ICD-10-CM

## 2021-11-05 HISTORY — DX: Gastro-esophageal reflux disease without esophagitis: K21.9

## 2021-11-05 HISTORY — DX: Sleep apnea, unspecified: G47.30

## 2021-11-05 HISTORY — DX: Headache, unspecified: R51.9

## 2021-11-05 LAB — COMPREHENSIVE METABOLIC PANEL
ALT: 15 U/L (ref 0–44)
AST: 24 U/L (ref 15–41)
Albumin: 3.9 g/dL (ref 3.5–5.0)
Alkaline Phosphatase: 79 U/L (ref 38–126)
Anion gap: 6 (ref 5–15)
BUN: 13 mg/dL (ref 8–23)
CO2: 29 mmol/L (ref 22–32)
Calcium: 9.2 mg/dL (ref 8.9–10.3)
Chloride: 104 mmol/L (ref 98–111)
Creatinine, Ser: 1.06 mg/dL — ABNORMAL HIGH (ref 0.44–1.00)
GFR, Estimated: 57 mL/min — ABNORMAL LOW (ref >60)
Glucose, Bld: 92 mg/dL (ref 70–99)
Potassium: 4 mmol/L (ref 3.5–5.1)
Sodium: 139 mmol/L (ref 135–145)
Total Bilirubin: 0.6 mg/dL (ref 0.3–1.2)
Total Protein: 7.4 g/dL (ref 6.5–8.1)

## 2021-11-05 LAB — CBC WITH DIFFERENTIAL/PLATELET
Abs Immature Granulocytes: 0 10*3/uL (ref 0.00–0.07)
Basophils Absolute: 0 10*3/uL (ref 0.0–0.1)
Basophils Relative: 1 %
Eosinophils Absolute: 0.2 10*3/uL (ref 0.0–0.5)
Eosinophils Relative: 3 %
HCT: 33.9 % — ABNORMAL LOW (ref 36.0–46.0)
Hemoglobin: 10.8 g/dL — ABNORMAL LOW (ref 12.0–15.0)
Immature Granulocytes: 0 %
Lymphocytes Relative: 36 %
Lymphs Abs: 1.8 10*3/uL (ref 0.7–4.0)
MCH: 28.2 pg (ref 26.0–34.0)
MCHC: 31.9 g/dL (ref 30.0–36.0)
MCV: 88.5 fL (ref 80.0–100.0)
Monocytes Absolute: 0.6 10*3/uL (ref 0.1–1.0)
Monocytes Relative: 12 %
Neutro Abs: 2.4 10*3/uL (ref 1.7–7.7)
Neutrophils Relative %: 48 %
Platelets: 329 10*3/uL (ref 150–400)
RBC: 3.83 MIL/uL — ABNORMAL LOW (ref 3.87–5.11)
RDW: 13.3 % (ref 11.5–15.5)
WBC: 5 10*3/uL (ref 4.0–10.5)
nRBC: 0 % (ref 0.0–0.2)

## 2021-11-05 LAB — TYPE AND SCREEN
ABO/RH(D): O POS
Antibody Screen: NEGATIVE

## 2021-11-05 LAB — SURGICAL PCR SCREEN
MRSA, PCR: NEGATIVE
Staphylococcus aureus: NEGATIVE

## 2021-11-05 LAB — URINALYSIS, ROUTINE W REFLEX MICROSCOPIC
Bilirubin Urine: NEGATIVE
Glucose, UA: NEGATIVE mg/dL
Hgb urine dipstick: NEGATIVE
Ketones, ur: NEGATIVE mg/dL
Leukocytes,Ua: NEGATIVE
Nitrite: NEGATIVE
Protein, ur: NEGATIVE mg/dL
Specific Gravity, Urine: 1.012 (ref 1.005–1.030)
pH: 7 (ref 5.0–8.0)

## 2021-11-05 NOTE — Patient Instructions (Addendum)
Your procedure is scheduled on: 11/17/21 Report to DAY SURGERY DEPARTMENT LOCATED ON 2ND FLOOR MEDICAL MALL ENTRANCE. To find out your arrival time please call [phone] between 1PM - 3PM on 11/16/21.  Remember: Instructions that are not followed completely may result in serious medical risk, up to and including death, or upon the discretion of your surgeon and anesthesiologist your surgery may need to be rescheduled.     _X__ 1. Do not eat food after midnight the night before your procedure.                 No gum chewing or hard candies. You may drink clear liquids up to 2 hours                 before you are scheduled to arrive for your surgery- DO not drink clear                 liquids within 2 hours of the start of your surgery.                 Clear Liquids include:  water, apple juice without pulp, clear carbohydrate                 drink such as Clearfast or Gatorade, Black Coffee or Tea (Do not add                 anything to coffee or tea). Diabetics water only  __X__2.  On the morning of surgery brush your teeth with toothpaste and water, you                 may rinse your mouth with mouthwash if you wish.  Do not swallow any              toothpaste of mouthwash.     _X__ 3.  No Alcohol for 24 hours before or after surgery.   _X__ 4.  Do Not Smoke or use e-cigarettes For 24 Hours Prior to Your Surgery.                 Do not use any chewable tobacco products for at least 6 hours prior to                 surgery.  ____  5.  Bring all medications with you on the day of surgery if instructed.   __X__  6.  Notify your doctor if there is any change in your medical condition      (cold, fever, infections).     Do not wear jewelry, make-up, hairpins, clips or nail polish. Do not wear lotions, powders, or perfumes.  Do not shave 48 hours prior to surgery. Men may shave face and neck. Do not bring valuables to the hospital.    [facility] is not responsible for any belongings  or valuables.  Contacts, dentures/partials or body piercings may not be worn into surgery. Bring a case for your contacts, glasses or hearing aids, a denture cup will be supplied. Leave your suitcase in the car. After surgery it may be brought to your room. For patients admitted to the hospital, discharge time is determined by your treatment team.   Patients discharged the day of surgery will not be allowed to drive home.   Please read over the following fact sheets that you were given:   MRSA Information, CHG soap, joint replacement booklet  __X__ Take these medicines the morning of surgery with A  SIP OF WATER:    1. pantoprazole (PROTONIX) 40 MG tablet  2. venlafaxine XR (EFFEXOR-XR) 150 MG 24 hr capsule  3.   4.  5.  6.  ____ Fleet Enema (as directed)   __X__ Use CHG Soap/SAGE wipes as directed  __X__ Use inhalers on the day of surgery  ____ Stop metformin/Janumet/Farxiga 2 days prior to surgery    ____ Take 1/2 of usual insulin dose the night before surgery. No insulin the morning          of surgery.   ____ Stop Blood Thinners Coumadin/Plavix/Xarelto/Pleta/Pradaxa/Eliquis/Effient/Aspirin  on   Or contact your Surgeon, Cardiologist or Medical Doctor regarding  ability to stop your blood thinners  __X__ Stop Anti-inflammatories 7 days before surgery such as Advil, Ibuprofen, Motrin,  BC or Goodies Powder, Naprosyn, Naproxen, Aleve, Aspirin   MAY TAKE TYLENOL  __X__ Stop all herbalS AND supplements, fish oil AND  vitaminS  until after surgery.    __X__ Bring C-Pap to the hospital.

## 2021-11-13 ENCOUNTER — Other Ambulatory Visit: Payer: Self-pay

## 2021-11-13 ENCOUNTER — Other Ambulatory Visit
Admission: RE | Admit: 2021-11-13 | Discharge: 2021-11-13 | Disposition: A | Discharge status: Home / Self Care | Payer: Medicare Other | Source: Ambulatory Visit | Attending: Surgery | Admitting: Surgery

## 2021-11-13 DIAGNOSIS — Z20822 Contact with and (suspected) exposure to covid-19: Secondary | ICD-10-CM | POA: Insufficient documentation

## 2021-11-13 DIAGNOSIS — Z01812 Encounter for preprocedural laboratory examination: Secondary | ICD-10-CM | POA: Insufficient documentation

## 2021-11-14 LAB — SARS CORONAVIRUS 2 (TAT 6-24 HRS): SARS Coronavirus 2: NEGATIVE

## 2021-11-16 MED ORDER — CHLORHEXIDINE GLUCONATE 0.12 % MT SOLN: 15.0000 mL | Freq: Once | OROMUCOSAL | Status: AC

## 2021-11-16 MED ORDER — ORAL CARE MOUTH RINSE: 15.0000 mL | Freq: Once | OROMUCOSAL | Status: AC

## 2021-11-16 MED ORDER — LACTATED RINGERS IV SOLN: INTRAVENOUS | Status: DC

## 2021-11-16 MED ORDER — CEFAZOLIN SODIUM-DEXTROSE 2-4 GM/100ML-% IV SOLN
2.0000 g | INTRAVENOUS | Status: AC
  Administered 2021-11-17: 2 g via INTRAVENOUS

## 2021-11-17 ENCOUNTER — Observation Stay
Admission: RE | Admit: 2021-11-17 | Discharge: 2021-11-18 | Disposition: A | Discharge status: Home / Self Care | Payer: Medicare Other | Attending: Surgery | Admitting: Surgery

## 2021-11-17 ENCOUNTER — Encounter
Admission: RE | Disposition: A | Discharge status: Home / Self Care | Payer: Self-pay | Source: Home / Self Care | Attending: Surgery

## 2021-11-17 ENCOUNTER — Other Ambulatory Visit: Payer: Self-pay

## 2021-11-17 ENCOUNTER — Observation Stay: Payer: Medicare Other

## 2021-11-17 ENCOUNTER — Ambulatory Visit: Payer: Medicare Other | Admitting: Anesthesiology

## 2021-11-17 ENCOUNTER — Encounter: Payer: Self-pay | Admitting: Surgery

## 2021-11-17 DIAGNOSIS — M1711 Unilateral primary osteoarthritis, right knee: Secondary | ICD-10-CM | POA: Diagnosis not present

## 2021-11-17 DIAGNOSIS — Z96651 Presence of right artificial knee joint: Secondary | ICD-10-CM

## 2021-11-17 DIAGNOSIS — G4730 Sleep apnea, unspecified: Secondary | ICD-10-CM | POA: Diagnosis not present

## 2021-11-17 DIAGNOSIS — Z8616 Personal history of COVID-19: Secondary | ICD-10-CM | POA: Insufficient documentation

## 2021-11-17 DIAGNOSIS — I10 Essential (primary) hypertension: Secondary | ICD-10-CM | POA: Diagnosis not present

## 2021-11-17 DIAGNOSIS — Z79899 Other long term (current) drug therapy: Secondary | ICD-10-CM | POA: Diagnosis not present

## 2021-11-17 HISTORY — PX: TOTAL KNEE ARTHROPLASTY: SHX125

## 2021-11-17 SURGERY — ARTHROPLASTY, KNEE, TOTAL
Anesthesia: General | Site: Knee | Laterality: Right

## 2021-11-17 MED ORDER — VITAMIN E 45 MG (100 UNIT) PO CAPS
400.0000 [IU] | ORAL_CAPSULE | Freq: Two times a day (BID) | ORAL | Status: DC
Start: 2021-11-17 — End: 2021-11-18
  Administered 2021-11-17 – 2021-11-18 (×2): 400 [IU] via ORAL
  Filled 2021-11-17 (×3): qty 4

## 2021-11-17 MED ORDER — TRAMADOL HCL 50 MG PO TABS
ORAL_TABLET | ORAL | Status: AC
  Filled 2021-11-17: qty 1

## 2021-11-17 MED ORDER — ONDANSETRON HCL 4 MG/2ML IJ SOLN: 4.0000 mg | Freq: Once | INTRAMUSCULAR | Status: DC | PRN

## 2021-11-17 MED ORDER — ROCURONIUM BROMIDE 100 MG/10ML IV SOLN
INTRAVENOUS | Status: DC | PRN
  Administered 2021-11-17: 40 mg via INTRAVENOUS
  Administered 2021-11-17: 20 mg via INTRAVENOUS

## 2021-11-17 MED ORDER — MAGNESIUM HYDROXIDE 400 MG/5ML PO SUSP: 30.0000 mL | Freq: Every day | ORAL | Status: DC | PRN

## 2021-11-17 MED ORDER — DIPHENHYDRAMINE HCL 12.5 MG/5ML PO ELIX
12.5000 mg | ORAL_SOLUTION | ORAL | Status: DC | PRN
  Filled 2021-11-17: qty 10

## 2021-11-17 MED ORDER — CYANOCOBALAMIN 500 MCG PO TABS
500.0000 ug | ORAL_TABLET | Freq: Every day | ORAL | Status: DC
  Administered 2021-11-18: 500 ug via ORAL
  Filled 2021-11-17: qty 1

## 2021-11-17 MED ORDER — MIDAZOLAM HCL 2 MG/2ML IJ SOLN
INTRAMUSCULAR | Status: AC
  Filled 2021-11-17: qty 2

## 2021-11-17 MED ORDER — HYDROCHLOROTHIAZIDE 12.5 MG PO TABS
12.5000 mg | ORAL_TABLET | Freq: Every day | ORAL | Status: DC
  Filled 2021-11-17: qty 1

## 2021-11-17 MED ORDER — OXYCODONE HCL 5 MG PO TABS
ORAL_TABLET | ORAL | Status: AC
  Administered 2021-11-17: 10 mg via ORAL
  Filled 2021-11-17: qty 2

## 2021-11-17 MED ORDER — KETOROLAC TROMETHAMINE 30 MG/ML IJ SOLN
INTRAMUSCULAR | Status: DC | PRN
  Administered 2021-11-17: 15 mg via INTRAVENOUS

## 2021-11-17 MED ORDER — TRANEXAMIC ACID 1000 MG/10ML IV SOLN
INTRAVENOUS | Status: DC | PRN
  Administered 2021-11-17: 1000 mg via TOPICAL

## 2021-11-17 MED ORDER — APIXABAN 2.5 MG PO TABS
2.5000 mg | ORAL_TABLET | Freq: Two times a day (BID) | ORAL | Status: DC
Start: 2021-11-18 — End: 2021-11-18
  Administered 2021-11-18: 2.5 mg via ORAL
  Filled 2021-11-17 (×2): qty 1

## 2021-11-17 MED ORDER — LIDOCAINE HCL (CARDIAC) PF 100 MG/5ML IV SOSY
PREFILLED_SYRINGE | INTRAVENOUS | Status: DC | PRN
  Administered 2021-11-17: 60 mg via INTRAVENOUS

## 2021-11-17 MED ORDER — FLEET ENEMA 7-19 GM/118ML RE ENEM: 1.0000 | ENEMA | Freq: Once | RECTAL | Status: DC | PRN

## 2021-11-17 MED ORDER — FENTANYL CITRATE (PF) 100 MCG/2ML IJ SOLN
INTRAMUSCULAR | Status: AC
  Filled 2021-11-17: qty 2

## 2021-11-17 MED ORDER — ONDANSETRON HCL 4 MG PO TABS: 4.0000 mg | ORAL_TABLET | Freq: Four times a day (QID) | ORAL | Status: DC | PRN

## 2021-11-17 MED ORDER — KETAMINE HCL 50 MG/5ML IJ SOSY
PREFILLED_SYRINGE | INTRAMUSCULAR | Status: AC
  Filled 2021-11-17: qty 5

## 2021-11-17 MED ORDER — CEFAZOLIN SODIUM-DEXTROSE 2-4 GM/100ML-% IV SOLN
2.0000 g | Freq: Four times a day (QID) | INTRAVENOUS | Status: AC
  Administered 2021-11-18: 2 g via INTRAVENOUS
  Filled 2021-11-17 (×2): qty 100

## 2021-11-17 MED ORDER — METOCLOPRAMIDE HCL 5 MG/ML IJ SOLN: 5.0000 mg | Freq: Three times a day (TID) | INTRAMUSCULAR | Status: DC | PRN

## 2021-11-17 MED ORDER — ACETAMINOPHEN 10 MG/ML IV SOLN
INTRAVENOUS | Status: DC | PRN
  Administered 2021-11-17: 1000 mg via INTRAVENOUS

## 2021-11-17 MED ORDER — OXYCODONE HCL 5 MG/5ML PO SOLN: 5.0000 mg | Freq: Once | ORAL | Status: DC | PRN

## 2021-11-17 MED ORDER — KETAMINE HCL 10 MG/ML IJ SOLN
INTRAMUSCULAR | Status: DC | PRN
  Administered 2021-11-17: 40 mg via INTRAVENOUS
  Administered 2021-11-17: 10 mg via INTRAVENOUS

## 2021-11-17 MED ORDER — LORATADINE 10 MG PO TABS
10.0000 mg | ORAL_TABLET | Freq: Every day | ORAL | Status: DC
  Administered 2021-11-18: 10 mg via ORAL
  Filled 2021-11-17: qty 1

## 2021-11-17 MED ORDER — PHENYLEPHRINE HCL-NACL 20-0.9 MG/250ML-% IV SOLN
INTRAVENOUS | Status: DC | PRN
  Administered 2021-11-17: 20 ug/min via INTRAVENOUS

## 2021-11-17 MED ORDER — ACETAMINOPHEN 500 MG PO TABS
1000.0000 mg | ORAL_TABLET | Freq: Four times a day (QID) | ORAL | Status: AC
  Administered 2021-11-17: 1000 mg via ORAL

## 2021-11-17 MED ORDER — STERILE WATER FOR IRRIGATION IR SOLN
Status: DC | PRN
  Administered 2021-11-17: 1000 mL

## 2021-11-17 MED ORDER — MIDAZOLAM HCL 2 MG/2ML IJ SOLN
INTRAMUSCULAR | Status: DC | PRN
Start: 2021-11-17 — End: 2021-11-17
  Administered 2021-11-17 (×2): 1 mg via INTRAVENOUS

## 2021-11-17 MED ORDER — FENTANYL CITRATE (PF) 100 MCG/2ML IJ SOLN
INTRAMUSCULAR | Status: AC
  Administered 2021-11-17: 50 ug via INTRAVENOUS
  Filled 2021-11-17: qty 2

## 2021-11-17 MED ORDER — 0.9 % SODIUM CHLORIDE (POUR BTL) OPTIME
TOPICAL | Status: DC | PRN
  Administered 2021-11-17: 500 mL

## 2021-11-17 MED ORDER — KETOROLAC TROMETHAMINE 30 MG/ML IJ SOLN
INTRAMUSCULAR | Status: AC
  Filled 2021-11-17: qty 1

## 2021-11-17 MED ORDER — DOCUSATE SODIUM 100 MG PO CAPS
100.0000 mg | ORAL_CAPSULE | Freq: Two times a day (BID) | ORAL | Status: DC
  Administered 2021-11-17 – 2021-11-18 (×2): 100 mg via ORAL
  Filled 2021-11-17 (×3): qty 1

## 2021-11-17 MED ORDER — SUGAMMADEX SODIUM 200 MG/2ML IV SOLN
INTRAVENOUS | Status: DC | PRN
  Administered 2021-11-17: 150 mg via INTRAVENOUS

## 2021-11-17 MED ORDER — BUPIVACAINE LIPOSOME 1.3 % IJ SUSP
INTRAMUSCULAR | Status: AC
  Filled 2021-11-17: qty 20

## 2021-11-17 MED ORDER — ACETAMINOPHEN 500 MG PO TABS
ORAL_TABLET | ORAL | Status: AC
  Filled 2021-11-17: qty 2

## 2021-11-17 MED ORDER — CEFAZOLIN SODIUM-DEXTROSE 2-4 GM/100ML-% IV SOLN
INTRAVENOUS | Status: AC
  Administered 2021-11-17: 2 g via INTRAVENOUS
  Filled 2021-11-17: qty 100

## 2021-11-17 MED ORDER — ESTRADIOL 1 MG PO TABS
0.5000 mg | ORAL_TABLET | Freq: Every day | ORAL | Status: DC
  Administered 2021-11-18: 0.5 mg via ORAL
  Filled 2021-11-17: qty 0.5

## 2021-11-17 MED ORDER — HYDROCHLOROTHIAZIDE 12.5 MG PO TABS
12.5000 mg | ORAL_TABLET | Freq: Every day | ORAL | Status: DC
  Administered 2021-11-18: 12.5 mg via ORAL
  Filled 2021-11-17: qty 1

## 2021-11-17 MED ORDER — IRBESARTAN 150 MG PO TABS
150.0000 mg | ORAL_TABLET | Freq: Every day | ORAL | Status: DC
  Administered 2021-11-18: 150 mg via ORAL
  Filled 2021-11-17: qty 1

## 2021-11-17 MED ORDER — OXYCODONE HCL 5 MG PO TABS: 5.0000 mg | ORAL_TABLET | Freq: Once | ORAL | Status: DC | PRN

## 2021-11-17 MED ORDER — OLMESARTAN MEDOXOMIL-HCTZ 20-12.5 MG PO TABS: 1.0000 | ORAL_TABLET | Freq: Every day | ORAL | Status: DC

## 2021-11-17 MED ORDER — PANTOPRAZOLE SODIUM 40 MG PO TBEC
40.0000 mg | DELAYED_RELEASE_TABLET | Freq: Two times a day (BID) | ORAL | Status: DC
  Administered 2021-11-17 – 2021-11-18 (×2): 40 mg via ORAL
  Filled 2021-11-17 (×3): qty 1

## 2021-11-17 MED ORDER — ONDANSETRON HCL 4 MG/2ML IJ SOLN: 4.0000 mg | Freq: Four times a day (QID) | INTRAMUSCULAR | Status: DC | PRN

## 2021-11-17 MED ORDER — GABAPENTIN 300 MG PO CAPS
ORAL_CAPSULE | ORAL | Status: AC
  Administered 2021-11-17: 300 mg via ORAL
  Filled 2021-11-17: qty 1

## 2021-11-17 MED ORDER — ALBUTEROL SULFATE (2.5 MG/3ML) 0.083% IN NEBU: 2.5000 mg | INHALATION_SOLUTION | RESPIRATORY_TRACT | Status: DC | PRN

## 2021-11-17 MED ORDER — ONDANSETRON HCL 4 MG/2ML IJ SOLN
INTRAMUSCULAR | Status: AC
  Filled 2021-11-17: qty 2

## 2021-11-17 MED ORDER — MAGNESIUM 500 MG PO TABS: 500.0000 mg | ORAL_TABLET | Freq: Every evening | ORAL | Status: DC

## 2021-11-17 MED ORDER — TRAMADOL HCL 50 MG PO TABS
ORAL_TABLET | ORAL | Status: AC
  Administered 2021-11-17: 50 mg via ORAL
  Filled 2021-11-17: qty 1

## 2021-11-17 MED ORDER — MAGNESIUM OXIDE -MG SUPPLEMENT 400 (240 MG) MG PO TABS
400.0000 mg | ORAL_TABLET | Freq: Every evening | ORAL | Status: DC
  Administered 2021-11-17: 400 mg via ORAL
  Filled 2021-11-17 (×3): qty 1

## 2021-11-17 MED ORDER — FENTANYL CITRATE (PF) 100 MCG/2ML IJ SOLN
INTRAMUSCULAR | Status: DC | PRN
  Administered 2021-11-17 (×2): 50 ug via INTRAVENOUS

## 2021-11-17 MED ORDER — SALINE SPRAY 0.65 % NA SOLN
1.0000 | NASAL | Status: DC | PRN
  Filled 2021-11-17: qty 44

## 2021-11-17 MED ORDER — PROPOFOL 1000 MG/100ML IV EMUL
INTRAVENOUS | Status: AC
  Filled 2021-11-17: qty 100

## 2021-11-17 MED ORDER — ACETAMINOPHEN 500 MG PO TABS
ORAL_TABLET | ORAL | Status: AC
  Administered 2021-11-17: 1000 mg via ORAL
  Filled 2021-11-17: qty 2

## 2021-11-17 MED ORDER — SODIUM CHLORIDE 0.9 % IR SOLN
Status: DC | PRN
  Administered 2021-11-17: 3000 mL

## 2021-11-17 MED ORDER — LIDOCAINE HCL (PF) 2 % IJ SOLN
INTRAMUSCULAR | Status: AC
  Filled 2021-11-17: qty 5

## 2021-11-17 MED ORDER — DEXAMETHASONE SODIUM PHOSPHATE 10 MG/ML IJ SOLN
INTRAMUSCULAR | Status: AC
  Filled 2021-11-17: qty 1

## 2021-11-17 MED ORDER — IRBESARTAN 150 MG PO TABS
150.0000 mg | ORAL_TABLET | Freq: Every day | ORAL | Status: DC
  Filled 2021-11-17: qty 1

## 2021-11-17 MED ORDER — OXYCODONE HCL 5 MG PO TABS
ORAL_TABLET | ORAL | Status: AC
  Administered 2021-11-17: 5 mg via ORAL
  Filled 2021-11-17: qty 2

## 2021-11-17 MED ORDER — ACETAMINOPHEN 10 MG/ML IV SOLN: 1000.0000 mg | Freq: Once | INTRAVENOUS | Status: DC | PRN

## 2021-11-17 MED ORDER — ONDANSETRON HCL 4 MG/2ML IJ SOLN
INTRAMUSCULAR | Status: DC | PRN
  Administered 2021-11-17: 4 mg via INTRAVENOUS

## 2021-11-17 MED ORDER — TRANEXAMIC ACID 1000 MG/10ML IV SOLN
INTRAVENOUS | Status: AC
  Filled 2021-11-17: qty 10

## 2021-11-17 MED ORDER — PRAVASTATIN SODIUM 20 MG PO TABS
20.0000 mg | ORAL_TABLET | Freq: Every day | ORAL | Status: DC
  Administered 2021-11-17: 20 mg via ORAL
  Filled 2021-11-17 (×2): qty 1

## 2021-11-17 MED ORDER — BISACODYL 10 MG RE SUPP
10.0000 mg | Freq: Every day | RECTAL | Status: DC | PRN
  Filled 2021-11-17: qty 1

## 2021-11-17 MED ORDER — OXYCODONE HCL 5 MG PO TABS: 5.0000 mg | ORAL_TABLET | ORAL | Status: DC | PRN

## 2021-11-17 MED ORDER — ACETAMINOPHEN 10 MG/ML IV SOLN
INTRAVENOUS | Status: AC
  Filled 2021-11-17: qty 100

## 2021-11-17 MED ORDER — PHENYLEPHRINE HCL (PRESSORS) 10 MG/ML IV SOLN
INTRAVENOUS | Status: DC | PRN
  Administered 2021-11-17 (×2): 240 ug via INTRAVENOUS
  Administered 2021-11-17: 160 ug via INTRAVENOUS

## 2021-11-17 MED ORDER — ACETAMINOPHEN 325 MG PO TABS: 325.0000 mg | ORAL_TABLET | Freq: Four times a day (QID) | ORAL | Status: DC | PRN

## 2021-11-17 MED ORDER — ALBUTEROL SULFATE HFA 108 (90 BASE) MCG/ACT IN AERS: 2.0000 | INHALATION_SPRAY | RESPIRATORY_TRACT | Status: DC | PRN

## 2021-11-17 MED ORDER — SODIUM CHLORIDE FLUSH 0.9 % IV SOLN
INTRAVENOUS | Status: AC
  Filled 2021-11-17: qty 40

## 2021-11-17 MED ORDER — PHENYLEPHRINE HCL-NACL 20-0.9 MG/250ML-% IV SOLN
INTRAVENOUS | Status: AC
  Filled 2021-11-17: qty 250

## 2021-11-17 MED ORDER — TRAMADOL HCL 50 MG PO TABS
50.0000 mg | ORAL_TABLET | Freq: Four times a day (QID) | ORAL | Status: DC | PRN
  Administered 2021-11-17: 50 mg via ORAL

## 2021-11-17 MED ORDER — SODIUM CHLORIDE (PF) 0.9 % IJ SOLN
INTRAMUSCULAR | Status: DC | PRN
  Administered 2021-11-17: 90 mL

## 2021-11-17 MED ORDER — DEXAMETHASONE SODIUM PHOSPHATE 10 MG/ML IJ SOLN
INTRAMUSCULAR | Status: DC | PRN
  Administered 2021-11-17: 10 mg via INTRAVENOUS

## 2021-11-17 MED ORDER — LACTATED RINGERS IV SOLN: INTRAVENOUS | Status: DC

## 2021-11-17 MED ORDER — NORTRIPTYLINE HCL 10 MG PO CAPS
20.0000 mg | ORAL_CAPSULE | Freq: Every day | ORAL | Status: DC
  Administered 2021-11-17: 20 mg via ORAL
  Filled 2021-11-17 (×2): qty 2

## 2021-11-17 MED ORDER — ROCURONIUM BROMIDE 10 MG/ML (PF) SYRINGE
PREFILLED_SYRINGE | INTRAVENOUS | Status: AC
  Filled 2021-11-17: qty 10

## 2021-11-17 MED ORDER — KETOROLAC TROMETHAMINE 15 MG/ML IJ SOLN: 7.5000 mg | Freq: Four times a day (QID) | INTRAMUSCULAR | Status: AC

## 2021-11-17 MED ORDER — PROPOFOL 10 MG/ML IV BOLUS
INTRAVENOUS | Status: DC | PRN
  Administered 2021-11-17: 160 mg via INTRAVENOUS
  Administered 2021-11-17: 40 mg via INTRAVENOUS

## 2021-11-17 MED ORDER — BUPIVACAINE-EPINEPHRINE (PF) 0.5% -1:200000 IJ SOLN
INTRAMUSCULAR | Status: AC
  Filled 2021-11-17: qty 30

## 2021-11-17 MED ORDER — KETOROLAC TROMETHAMINE 15 MG/ML IJ SOLN
INTRAMUSCULAR | Status: AC
  Administered 2021-11-17: 7.5 mg via INTRAVENOUS
  Filled 2021-11-17: qty 1

## 2021-11-17 MED ORDER — CHLORHEXIDINE GLUCONATE 0.12 % MT SOLN
OROMUCOSAL | Status: AC
  Administered 2021-11-17: 15 mL via OROMUCOSAL
  Filled 2021-11-17: qty 15

## 2021-11-17 MED ORDER — ADULT MULTIVITAMIN W/MINERALS CH
1.0000 | ORAL_TABLET | Freq: Every day | ORAL | Status: DC
  Administered 2021-11-18: 1 via ORAL
  Filled 2021-11-17: qty 1

## 2021-11-17 MED ORDER — GABAPENTIN 300 MG PO CAPS: 300.0000 mg | ORAL_CAPSULE | Freq: Every day | ORAL | Status: DC

## 2021-11-17 MED ORDER — FENTANYL CITRATE (PF) 100 MCG/2ML IJ SOLN
25.0000 ug | INTRAMUSCULAR | Status: DC | PRN
  Administered 2021-11-17: 50 ug via INTRAVENOUS

## 2021-11-17 MED ORDER — HYDROMORPHONE HCL 1 MG/ML IJ SOLN
INTRAMUSCULAR | Status: AC
  Filled 2021-11-17: qty 0.5

## 2021-11-17 MED ORDER — METOCLOPRAMIDE HCL 10 MG PO TABS: 5.0000 mg | ORAL_TABLET | Freq: Three times a day (TID) | ORAL | Status: DC | PRN

## 2021-11-17 MED ORDER — HYDROMORPHONE HCL 1 MG/ML IJ SOLN
0.2500 mg | INTRAMUSCULAR | Status: DC | PRN
  Administered 2021-11-17: 0.5 mg via INTRAVENOUS

## 2021-11-17 MED ORDER — VENLAFAXINE HCL ER 150 MG PO CP24
150.0000 mg | ORAL_CAPSULE | Freq: Every day | ORAL | Status: DC
  Administered 2021-11-18: 150 mg via ORAL
  Filled 2021-11-17: qty 1

## 2021-11-17 MED ORDER — SODIUM CHLORIDE 0.9 % IV SOLN: INTRAVENOUS | Status: DC

## 2021-11-17 MED ORDER — CYCLOSPORINE 0.05 % OP EMUL
1.0000 [drp] | Freq: Two times a day (BID) | OPHTHALMIC | Status: DC
  Administered 2021-11-17 – 2021-11-18 (×2): 1 [drp] via OPHTHALMIC
  Filled 2021-11-17 (×3): qty 30

## 2021-11-17 MED ORDER — VITAMIN D3 25 MCG (1000 UNIT) PO TABS
5000.0000 [IU] | ORAL_TABLET | Freq: Two times a day (BID) | ORAL | Status: DC
  Filled 2021-11-17 (×2): qty 5

## 2021-11-17 SURGICAL SUPPLY — 64 items
APL PRP STRL LF DISP 70% ISPRP (MISCELLANEOUS) ×1
BIT DRILL QUICK REL 1/8 2PK SL (DRILL) ×3 IMPLANT
BLADE SAW SAG 25X90X1.19 (BLADE) ×2 IMPLANT
BLADE SURG SZ20 CARB STEEL (BLADE) ×2 IMPLANT
BNDG CMPR STD VLCR NS LF 5.8X6 (GAUZE/BANDAGES/DRESSINGS) ×1
BNDG ELASTIC 6X5.8 VLCR NS LF (GAUZE/BANDAGES/DRESSINGS) ×2 IMPLANT
BRNG TIB 71X10 ANT STAB MDLR (Insert) ×1 IMPLANT
CEMENT BONE R 1X40 (Cement) ×4 IMPLANT
CEMENT VACUUM MIXING SYSTEM (MISCELLANEOUS) ×2 IMPLANT
CHLORAPREP W/TINT 26 (MISCELLANEOUS) ×2 IMPLANT
COMPONENT PATELLAR VGD 7.8X3 (Joint) ×2 IMPLANT
COOLER POLAR GLACIER W/PUMP (MISCELLANEOUS) ×2 IMPLANT
COVER MAYO STAND REUSABLE (DRAPES) ×2 IMPLANT
CUFF TOURN SGL QUICK 34 (TOURNIQUET CUFF)
CUFF TRNQT CYL 34X4.125X (TOURNIQUET CUFF) IMPLANT
DRAPE 3/4 80X56 (DRAPES) ×2 IMPLANT
DRAPE IMP U-DRAPE 54X76 (DRAPES) ×2 IMPLANT
DRAPE INCISE 23X17 IOBAN STRL (DRAPES) ×1
DRAPE INCISE IOBAN 23X17 STRL (DRAPES) ×1 IMPLANT
DRAPE INCISE IOBAN 66X45 STRL (DRAPES) ×2 IMPLANT
DRILL QUICK RELEASE 1/8 INCH (DRILL) ×6
DRSG MEPILEX SACRM 8.7X9.8 (GAUZE/BANDAGES/DRESSINGS) IMPLANT
DRSG OPSITE POSTOP 4X10 (GAUZE/BANDAGES/DRESSINGS) ×2 IMPLANT
DRSG OPSITE POSTOP 4X8 (GAUZE/BANDAGES/DRESSINGS) ×2 IMPLANT
ELECT REM PT RETURN 9FT ADLT (ELECTROSURGICAL) ×2
ELECTRODE REM PT RTRN 9FT ADLT (ELECTROSURGICAL) ×1 IMPLANT
GAUZE 4X4 16PLY ~~LOC~~+RFID DBL (SPONGE) ×2 IMPLANT
GAUZE XEROFORM 1X8 LF (GAUZE/BANDAGES/DRESSINGS) ×2 IMPLANT
GLOVE SRG 8 PF TXTR STRL LF DI (GLOVE) ×1 IMPLANT
GLOVE SURG ENC MOIS LTX SZ7.5 (GLOVE) ×8 IMPLANT
GLOVE SURG ENC MOIS LTX SZ8 (GLOVE) ×8 IMPLANT
GLOVE SURG UNDER LTX SZ8 (GLOVE) ×2 IMPLANT
GLOVE SURG UNDER POLY LF SZ8 (GLOVE) ×2
GOWN STRL REUS W/ TWL LRG LVL3 (GOWN DISPOSABLE) ×1 IMPLANT
GOWN STRL REUS W/ TWL XL LVL3 (GOWN DISPOSABLE) ×1 IMPLANT
GOWN STRL REUS W/TWL LRG LVL3 (GOWN DISPOSABLE) ×2
GOWN STRL REUS W/TWL XL LVL3 (GOWN DISPOSABLE) ×2
INSERT TIB BEARING 71 10 (Insert) ×2 IMPLANT
IV NS IRRIG 3000ML ARTHROMATIC (IV SOLUTION) ×2 IMPLANT
KIT TURNOVER KIT A (KITS) ×2 IMPLANT
KNEE CR FEMORAL RT 65MM (Femur) ×2 IMPLANT
MANIFOLD NEPTUNE II (INSTRUMENTS) ×2 IMPLANT
NEEDLE SPNL 20GX3.5 QUINCKE YW (NEEDLE) ×2 IMPLANT
NS IRRIG 500ML POUR BTL (IV SOLUTION) ×2 IMPLANT
PACK TOTAL KNEE (MISCELLANEOUS) ×2 IMPLANT
PAD WRAPON POLAR KNEE (MISCELLANEOUS) ×1 IMPLANT
PATELLA SERIES A (Orthopedic Implant) IMPLANT
PLATE KNEE TIBIAL 71MM FIXED (Plate) ×2 IMPLANT
PULSAVAC PLUS IRRIG FAN TIP (DISPOSABLE) ×2
SPONGE T-LAP 18X18 ~~LOC~~+RFID (SPONGE) ×6 IMPLANT
STAPLER SKIN PROX 35W (STAPLE) ×2 IMPLANT
SUCTION FRAZIER HANDLE 10FR (MISCELLANEOUS) ×2
SUCTION TUBE FRAZIER 10FR DISP (MISCELLANEOUS) ×1 IMPLANT
SUT VIC AB 0 CT1 36 (SUTURE) ×6 IMPLANT
SUT VIC AB 2-0 CT1 27 (SUTURE) ×6
SUT VIC AB 2-0 CT1 TAPERPNT 27 (SUTURE) ×3 IMPLANT
SYR 10ML LL (SYRINGE) ×2 IMPLANT
SYR 20ML LL LF (SYRINGE) ×2 IMPLANT
SYR 30ML LL (SYRINGE) IMPLANT
TIP FAN IRRIG PULSAVAC PLUS (DISPOSABLE) ×1 IMPLANT
TRAP FLUID SMOKE EVACUATOR (MISCELLANEOUS) ×2 IMPLANT
WATER STERILE IRR 1000ML POUR (IV SOLUTION) ×2 IMPLANT
WATER STERILE IRR 500ML POUR (IV SOLUTION) ×2 IMPLANT
WRAPON POLAR PAD KNEE (MISCELLANEOUS) ×2

## 2021-11-17 NOTE — TOC Initial Note (Addendum)
Transition of Care [facility]) - Initial/Assessment Note    Patient Details  Name: Jody Brewer MRN: [id number] Date of Birth: 1951/06/10  Transition of [facility]) CM/SW Contact:    Victoria Cellar, RN Phone Number: 11/17/2021, 1:51 PM  Clinical Narrative:                 Patient already set up with [facility] for HHC. Confirmed with [facility].   Patient reports already having RW. Order placed with Rhonda @ [facility] for [facility].        Patient Goals and CMS Choice        Expected Discharge Plan and Services                                                Prior Living Arrangements/Services                       Activities of Daily Living Home Assistive Devices/Equipment: Shower chair with back, Eyeglasses, Environmental consultant (specify type) ADL Screening (condition at time of admission) Patient's cognitive ability adequate to safely complete daily activities?: Yes Is the patient deaf or have difficulty hearing?: Yes Does the patient have difficulty seeing, even when wearing glasses/contacts?: Yes Does the patient have difficulty concentrating, remembering, or making decisions?: No Patient able to express need for assistance with ADLs?: Yes Does the patient have difficulty dressing or bathing?: No Independently performs ADLs?: Yes (appropriate for developmental age) Does the patient have difficulty walking or climbing stairs?: Yes Weakness of Legs: Right Weakness of Arms/Hands: None  Permission Sought/Granted                  Emotional Assessment              Admission diagnosis:  Status post total knee replacement using cement, right [Z96.651] Patient Active Problem List   Diagnosis Date Noted   Status post total knee replacement using cement, right 11/17/2021   Chest pain 01/15/2020   ARF (acute renal failure) (HCC) 01/15/2020   SOB (shortness of breath)    COVID-19 12/27/2019   HTN (hypertension), benign 12/27/2019   Asthma, intermittent,  uncomplicated 12/27/2019   Pneumonia due to COVID-19 virus 12/27/2019   PCP:  Danella Penton, MD Pharmacy:   [facility] [address] [place] - [address] [place] [address] Phone: [phone] Fax: [phone]  [id number] [place], [place] - [address] AT [facility] [address] [facility] [address] [place] [address] Phone: [phone] Fax: [phone]     Social Determinants of Health (SDOH) Interventions    Readmission Risk Interventions No flowsheet data found.

## 2021-11-17 NOTE — Progress Notes (Signed)
Patient ambulated to the bathroom with mild discomfort.

## 2021-11-17 NOTE — Transfer of Care (Signed)
Immediate Anesthesia Transfer of Care Note  Patient: RALYNN SAN  Procedure(s) Performed: TOTAL KNEE ARTHROPLASTY (Right: Knee)  Patient Location: PACU  Anesthesia Type:General  Level of Consciousness: awake, alert  and oriented  Airway & Oxygen Therapy: Patient Spontanous Breathing and Patient connected to face mask oxygen  Post-op Assessment: Report given to RN and Post -op Vital signs reviewed and stable  Post vital signs: Reviewed and stable  Last Vitals:  Vitals Value Taken Time  BP 127/52 11/17/21 0958  Temp 36.4 C 11/17/21 0958  Pulse 94 11/17/21 0958  Resp 13 11/17/21 0958  SpO2 100 % 11/17/21 0958    Last Pain:  Vitals:   11/17/21 0630  PainSc: 2          Complications: No notable events documented.

## 2021-11-17 NOTE — Evaluation (Signed)
Physical Therapy Evaluation Patient Details Name: Jody Brewer MRN: [id number] DOB: [birth year] Today's Date: 11/17/2021  History of Present Illness  Pt is a 70 y.o. 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  Clinical Impression  Prior to hospital admission, pt was independent with ambulation; lives on 2nd floor of home (pt's sister lives on 1st floor); 1 STE no railing into home and then 12 steps with railing to pt's 2nd floor residence.  Pt has family support for discharge home and pt is hoping she can navigate steps to get to her 2nd floor living area/residence.  Currently pt is SBA semi-supine to sitting edge of bed; min assist progressing to CGA with transfers using RW; and CGA ambulating 40 feet with RW.  Pt able to toilet during session; pt demonstrated good balance washing hands at sink.  Pt's O2 sats 86-88% (on room air) at times at rest during session but improved O2 saturation noted with activity (92% or greater on room air)--nurse notified.  Able to perform R LE SLR with minimal assist; R knee AROM 10-85 degrees; 2/10 R knee pain beginning/end of session at rest (minimal increase in R knee pain during sessions activities).  Pt would benefit from skilled PT to address noted impairments and functional limitations (see below for any additional details).  Upon hospital discharge, pt would benefit from HHPT and support from family.    Recommendations for follow up therapy are one component of a multi-disciplinary discharge planning process, led by the attending physician.  Recommendations may be updated based on patient status, additional functional criteria and insurance authorization.  Follow Up Recommendations Home health PT    Assistance Recommended at Discharge Intermittent  Supervision/Assistance  Functional Status Assessment Patient has had a recent decline in their functional status and demonstrates the ability to make significant improvements in function in a reasonable and predictable amount of time.  Equipment Recommendations  Rolling walker (2 wheels);BSC/3in1    Recommendations for Other Services OT consult     Precautions / Restrictions Precautions Precautions: Fall;Knee Precaution Booklet Issued: Yes (comment) Restrictions Weight Bearing Restrictions: Yes RLE Weight Bearing: Weight bearing as tolerated      Mobility  Bed Mobility Overal bed mobility: Needs Assistance Bed Mobility: Supine to Sit     Supine to sit: Supervision;HOB elevated     General bed mobility comments: SBA for lines    Transfers Overall transfer level: Needs assistance Equipment used: Rolling walker (2 wheels) Transfers: Sit to/from Stand;Bed to chair/wheelchair/BSC Sit to Stand: Min guard;Min assist   Step pivot transfers: Min guard (stand step turn bed to recliner with RW)       General transfer comment: min assist to stand from bed; CGA to stand from recliner and from toilet (using grab bar); vc's for UE/LE placement and overall technique    Ambulation/Gait Ambulation/Gait assistance: Min guard Gait Distance (Feet): 40 Feet Assistive device: Rolling walker (2 wheels)   Gait velocity: decreased     General Gait Details: antalgic; decreased stance time R LE; step to gait pattern; vc's for gait technique, walker use, and not to pivot on R LE with turns  Stairs            Wheelchair Mobility    Modified Rankin (Stroke Patients Only)  Balance Overall balance assessment: Needs assistance Sitting-balance support: No upper extremity supported;Feet supported Sitting balance-Leahy Scale: Good Sitting balance - Comments: steady sitting reaching within BOS   Standing balance support: No upper extremity supported Standing balance-Leahy  Scale: Good Standing balance comment: steady standing washing hands at sink                             Pertinent Vitals/Pain Pain Assessment: 0-10 Pain Score: 2  Pain Location: R knee Pain Descriptors / Indicators: Sore Pain Intervention(s): Limited activity within patient's tolerance;Monitored during session;Premedicated before session;Repositioned;Other (comment) (polar care applied and activated) HR WFL during sessions activities.    Home Living Family/patient expects to be discharged to:: Private residence Living Arrangements: Other relatives (pt's sister) Available Help at Discharge: Family;Available 24 hours/day Type of Home: House Home Access: Stairs to enter   [employer] of Steps: 1 step to enter home no railing Alternate Level Stairs-Number of Steps: 12 steps with railing to pt's main living area (railing on R for 1st 6 steps and on L for next 6 steps) Home Layout: Two level;Other (Comment) (can stay on main level if needed) Home Equipment: Rolling Walker (2 wheels);Shower seat Additional Comments: Pt's sister able to assist 24/7 and pt's daughter can come in/out to assist    Prior Function Prior Level of Function : Independent/Modified Independent             Mobility Comments: No recent falls.       Hand Dominance        Extremity/Trunk Assessment   Upper Extremity Assessment Upper Extremity Assessment: Overall WFL for tasks assessed    Lower Extremity Assessment Lower Extremity Assessment: RLE deficits/detail (L LE WFL) RLE Deficits / Details: minimal assist for R LE SLR; at least 3/5 AROM hip flexion and ankle DF/PF RLE: Unable to fully assess due to pain    Cervical / Trunk Assessment Cervical / Trunk Assessment: Normal  Communication   Communication: HOH (pt reports being deaf R ear and partial hearing L ear)  Cognition Arousal/Alertness: Awake/alert Behavior During Therapy: WFL for tasks assessed/performed Overall  Cognitive Status: Within Functional Limits for tasks assessed                                          General Comments General comments (skin integrity, edema, etc.): R LE dressing in place.  Nursing cleared pt for participation in physical therapy.  Pt agreeable to PT session.  Pt's daughter present during session.    Exercises Total Joint Exercises Ankle Circles/Pumps: AROM;Strengthening;Both;10 reps;Supine Quad Sets: AROM;Strengthening;Right;10 reps;Supine Short Arc Quad: AAROM;Strengthening;Right;10 reps;Supine Heel Slides: AAROM;Strengthening;Right;10 reps;Supine Hip ABduction/ADduction: AAROM;Strengthening;Right;10 reps;Supine Straight Leg Raises: AAROM;Strengthening;Right;10 reps;Supine Goniometric ROM: R knee AROM 10-85 degrees   Assessment/Plan    PT Assessment Patient needs continued PT services  PT Problem List Decreased strength;Decreased range of motion;Decreased activity tolerance;Decreased balance;Decreased mobility;Decreased knowledge of use of DME;Decreased knowledge of precautions;Pain;Decreased skin integrity       PT Treatment Interventions DME instruction;Gait training;Stair training;Functional mobility training;Therapeutic activities;Therapeutic exercise;Balance training;Patient/family education    PT Goals (Current goals can be found in the Care Plan section)  Acute Rehab PT Goals Patient Stated Goal: to go home PT Goal Formulation: With patient Time For Goal Achievement: 12/01/21 Potential to Achieve Goals: Good    Frequency BID   Barriers to discharge  Co-evaluation               AM-PAC PT "6 Clicks" Mobility  Outcome Measure Help needed turning from your back to your side while in a flat bed without using bedrails?: None Help needed moving from lying on your back to sitting on the side of a flat bed without using bedrails?: A Little Help needed moving to and from a bed to a chair (including a wheelchair)?: A  Little Help needed standing up from a chair using your arms (e.g., wheelchair or bedside chair)?: A Little Help needed to walk in hospital room?: A Little Help needed climbing 3-5 steps with a railing? : A Little 6 Click Score: 19    End of Session Equipment Utilized During Treatment: Gait belt Activity Tolerance: Patient tolerated treatment well;No increased pain Patient left: in chair;with call bell/phone within reach;with family/visitor present;with SCD's reapplied;Other (comment) (B heels floating via towel rolls; polar care in place) Nurse Communication: Mobility status;Precautions;Weight bearing status (pt able to urinate during session) PT Visit Diagnosis: Other abnormalities of gait and mobility (R26.89);Muscle weakness (generalized) (M62.81);Pain Pain - Right/Left: Right Pain - part of body: Knee    Time: 1345-1451 PT Time Calculation (min) (ACUTE ONLY): 66 min   Charges:   PT Evaluation $PT Eval Low Complexity: 1 Low PT Treatments $Gait Training: 8-22 mins $Therapeutic Exercise: 8-22 mins $Therapeutic Activity: 8-22 mins       Hendricks Limes, PT 11/17/21, 3:36 PM

## 2021-11-17 NOTE — Anesthesia Preprocedure Evaluation (Addendum)
Anesthesia Evaluation  Patient identified by MRN, date of birth, ID band Patient awake    Reviewed: Allergy & Precautions, NPO status , Patient's Chart, lab work & pertinent test results  History of Anesthesia Complications Negative for: history of anesthetic complications  Airway Mallampati: III   Neck ROM: Full    Dental no notable dental hx.    Pulmonary asthma , sleep apnea and Continuous Positive Airway Pressure Ventilation , COPD,    Pulmonary exam normal breath sounds clear to auscultation       Cardiovascular hypertension, Normal cardiovascular exam Rhythm:Regular Rate:Normal  ECG 11/05/21: normal  Echo 10/24/18:  NORMAL LEFT VENTRICULAR SYSTOLIC FUNCTION  NORMAL RIGHT VENTRICULAR SYSTOLIC FUNCTION  MILD VALVULAR REGURGITATION   NO VALVULAR STENOSIS     Neuro/Psych  Headaches, PSYCHIATRIC DISORDERS Anxiety    GI/Hepatic GERD  ,  Endo/Other  negative endocrine ROS  Renal/GU negative Renal ROS     Musculoskeletal  (+) Arthritis , Hx L2-4 fusion with rods   Abdominal   Peds  Hematology negative hematology ROS (+)   Anesthesia Other Findings   Reproductive/Obstetrics                            Anesthesia Physical Anesthesia Plan  ASA: 2  Anesthesia Plan: General and Spinal   Post-op Pain Management:    Induction: Intravenous  PONV Risk Score and Plan: 3 and Propofol infusion, TIVA, Treatment may vary due to age or medical condition and Ondansetron  Airway Management Planned: Natural Airway  Additional Equipment:   Intra-op Plan:   Post-operative Plan:   Informed Consent: I have reviewed the patients History and Physical, chart, labs and discussed the procedure including the risks, benefits and alternatives for the proposed anesthesia with the patient or authorized representative who has indicated his/her understanding and acceptance.     Dental advisory  given  Plan Discussed with: CRNA  Anesthesia Plan Comments: (Plan for spinal and GA with natural airway, LMA/GETA backup.  Patient consented for risks of anesthesia including but not limited to:  - adverse reactions to medications - damage to eyes, teeth, lips or other oral mucosa - nerve damage due to positioning  - sore throat or hoarseness - headache, bleeding, infection, nerve damage 2/2 spinal - damage to heart, brain, nerves, lungs, other parts of body or loss of life  Informed patient about role of CRNA in peri- and intra-operative care.  Patient voiced understanding.)        Anesthesia Quick Evaluation

## 2021-11-17 NOTE — Anesthesia Procedure Notes (Signed)
Procedure Name: Intubation Date/Time: 11/17/2021 7:51 AM Performed by: Debe Coder, CRNA Pre-anesthesia Checklist: Patient identified, Emergency Drugs available, Suction available and Patient being monitored Patient Re-evaluated:Patient Re-evaluated prior to induction Oxygen Delivery Method: Circle system utilized Preoxygenation: Pre-oxygenation with 100% oxygen Induction Type: IV induction Ventilation: Mask ventilation without difficulty Laryngoscope Size: Mac and 3 Grade View: Grade II Tube type: Oral Tube size: 7.0 mm Number of attempts: 1 Airway Equipment and Method: Stylet and Oral airway Placement Confirmation: ETT inserted through vocal cords under direct vision, positive ETCO2 and breath sounds checked- equal and bilateral Secured at: 20 cm Tube secured with: Tape Dental Injury: Teeth and Oropharynx as per pre-operative assessment

## 2021-11-17 NOTE — Op Note (Signed)
11/17/2021  10:03 AM  Patient:   Jody Brewer  Pre-Op Diagnosis:   Degenerative joint disease, right knee.  Post-Op Diagnosis:   Same  Procedure:   Right TKA using all-cemented [employer] Vanguard system with a 65 mm PCR femur, a 71 mm tibial tray with a 10 mm anterior stabilized E-poly insert, and a 34 x 7.8 mm all-poly 3-pegged domed patella.  Surgeon:   Maryagnes Amos, MD  Assistant:   Horris Latino, PA-C; Margarita Rana, PA-S  Anesthesia:   Attempted spinal --> GET  Findings:   As above  Complications:   None  EBL:   5 cc  Fluids:   800 cc crystalloid  UOP:   None  TT:   88 minutes at 300 mmHg  Drains:   None  Closure:   Staples  Implants:   As above  Brief Clinical Note:   The patient is a 70 year old female with a long history of progressively worsening right knee pain. The patient's symptoms have progressed despite medications, activity modification, injections, etc. The patient's history and examination were consistent with advanced degenerative joint disease of the right knee confirmed by plain radiographs. The patient presents at this time for a right total knee arthroplasty.  Procedure:   The patient was brought into the operating room. After attempting a spinal, the patient was lain in the supine position and adequate general endotracheal intubation and anesthesia was obtained. The right lower extremity was prepped with ChloraPrep solution and draped sterilely. Preoperative antibiotics were administered. After verifying the proper laterality with a surgical timeout, the limb was exsanguinated with an Esmarch and the tourniquet inflated to 300 mmHg.   A standard anterior approach to the knee was made through an approximately 6-7 inch incision. The incision was carried down through the subcutaneous tissues to expose superficial retinaculum. This was split the length of the incision and the medial flap elevated sufficiently to expose the medial retinaculum. The  medial retinaculum was incised, leaving a 3-4 mm cuff of tissue on the patella. This was extended distally along the medial border of the patellar tendon and proximally through the medial third of the quadriceps tendon. A subtotal fat pad excision was performed before the soft tissues were elevated off the anteromedial and anterolateral aspects of the proximal tibia to the level of the collateral ligaments. The anterior portions of the medial and lateral menisci were removed, as was the anterior cruciate ligament. With the knee flexed to 90, the external tibial guide was positioned and the appropriate proximal tibial cut made. This piece was taken to the back table where it was measured and found to be optimally replicated by a 71 mm component.  Attention was directed to the distal femur. The intramedullary canal was accessed through a 3/8" drill hole. The intramedullary guide was inserted and positioned in order to obtain a neutral flexion gap. The intercondylar block was positioned with care taken to avoid notching the anterior cortex of the femur. The appropriate cut was made. Next, the distal cutting block was placed at 6 of valgus alignment. Using the 9 mm slot, the distal cut was made. The distal femur was measured and found to be optimally replicated by the 65 mm component. The 65 mm 4-in-1 cutting block was positioned and first the posterior, then the posterior chamfer, the anterior chamfer, and finally the anterior cuts were made. At this point, the posterior portions medial and lateral menisci were removed. A trial reduction was performed using the appropriate femoral  and tibial components with the 10 mm insert. This demonstrated excellent stability to varus and valgus stressing both in flexion and extension while permitting full extension. Patella tracking was assessed and found to be excellent. Therefore, the tibial guide position was marked on the proximal tibia.   The patella thickness was  measured and found to be 20 mm. Therefore, the appropriate cut was made. The patellar surface was measured and found to be optimally replicated by the 34 mm component. The three peg holes were drilled in place before the trial button was inserted. Patella tracking was assessed and found to be excellent, passing the "no thumb test". The lug holes were drilled into the distal femur before the trial component was removed, leaving only the tibial tray. The keel was then created using the appropriate tower, reamer, and punch.  The bony surfaces were prepared for cementing by irrigating them thoroughly with sterile saline solution via the jet lavage system. A bone plug was fashioned from some of the bone that had been removed previously and used to plug the distal femoral canal. In addition, 20 cc of Exparel diluted out to 60 cc with normal saline and 30 cc of 0.5% Sensorcaine were injected into the postero-medial and postero-lateral aspects of the knee, the medial and lateral gutter regions, and the peri-incisional tissues to help with postoperative analgesia. Meanwhile, the cement was being mixed on the back table. When it was ready, the tibial tray was cemented in first. The excess cement was removed using Personal assistant. Next, the femoral component was impacted into place. Again, the excess cement was removed using Personal assistant. The 10 mm trial insert was positioned and the knee brought into extension while the cement hardened. Finally, the patella was cemented into place and secured using the patellar clamp. Again, the excess cement was removed using Personal assistant. Once the cement had hardened, the knee was placed through a range of motion with the findings as described above. Therefore, the trial insert was removed and, after verifying that no cement had been retained posteriorly, the permanent 10 mm anterior stabilized E-polyethylene insert was positioned and secured using the appropriate key locking  mechanism. Again the knee was placed through a range of motion with the findings as described above.  The wound was copiously irrigated with sterile saline solution using the jet lavage system before the quadriceps tendon and retinacular layer were reapproximated using #0 Vicryl interrupted sutures. The superficial retinacular layer also was closed using a running #0 Vicryl suture. A total of 10 cc of transexemic acid (TXA) was injected intra-articularly before the subcutaneous tissues were closed in several layers using 2-0 Vicryl interrupted sutures. The skin was closed using staples. A sterile honeycomb dressing was applied to the skin before the leg was wrapped with an Ace wrap to accommodate the Polar Care device. The patient was then awakened, extubated, and returned to the recovery room in satisfactory condition after tolerating the procedure well.

## 2021-11-17 NOTE — H&P (Signed)
History of Present Illness: Jody Brewer is a 70 y.o. female who presents today for repeat evaluation of ongoing right knee pain. The patient has been experiencing right knee pain over the past several years however worse in the past several months. The patient underwent x-rays of the right knee which did demonstrate mild osteoarthritic changes, she initially underwent a steroid injection which did provide relief however her pain soon returned. Because no evidence of significant arthritic changes was noted on the x-ray, MRI scan was ordered which did demonstrate more severe arthritic changes than what the x-ray had initially visualized. The patient underwent a repeat knee steroid injection in addition to a Zilretta injection without any significant relief of her pain. The patient reports continued swelling and an aching discomfort both along the medial and lateral aspect of the right knee. Pain score is a 5 out of 10 at today's visit, the patient does report 9 out of 10 pain in the right knee at the end of the day. She denies any recent trauma or injury affecting the right knee. She reports decreased range of motion with both extension and flexion at this time. She is quite frustrated by her continued discomfort and would like to consider more aggressive treatment options. She denies any personal history of heart attack, stroke or blood clot. She does have a history of COPD and does use a CPAP machine.  Past Medical History:  FH: colon polyps 08/28/2019   GERD (gastroesophageal reflux disease) 08/28/2019   Hearing loss, bilateral 08/28/2019   History of tetanus, diphtheria, and acellular pertussis booster vaccination (Tdap) 08/17/2013   Hypertension   IBS (normal colonoscopy in 09/2008)   Restless leg syndrome   Sleep apnea (status post evaluation and surgery by ENT)   Past Surgical History:  APPENDECTOMY   CHOLECYSTECTOMY 1994 (laparoscopic)   COLONOSCOPY 05/23/1992 (Ext Hemorrhoids)   COLONOSCOPY  10/08/2008 (FH Colon Polyps (Father/Sister))   COLONOSCOPY 05/30/2014 (FH Colon Polyps (Father/Sister) CBF 05/2019 Recall ltr mailed)   COLONOSCOPY 10/23/2019 (FHCP/diverticulosis/non-bleeding internal hemorrhoids 10 year repeat TKT)   EGD 01/16/1993   EGD 06/05/1996   EGD 05/30/2014 (No repeat per RTE)   elbow surgery Right (20-25+ years ago, [facility])   HYSTERECTOMY (1987 with one partial ovary removed for bleeding and abnormal PAP)   Septoplasty and bilateral turbinate reduction 10/1996   Status post spinal laminectomies between 10-11/2005 for severe DDD   Past Family History:  High blood pressure (Hypertension) Sister   Emphysema Mother   Alzheimer's disease Father   High blood pressure (Hypertension) Father   Parkinsonism Father   High blood pressure (Hypertension) Sister   High blood pressure (Hypertension) Brother   Medications:  albuterol 90 mcg/actuation inhaler Inhale 2 inhalations into the lungs every 6 (six) hours as needed for Wheezing or Shortness of Breath 18 g 2   cholecalciferol (VITAMIN D3) 1000 unit tablet Take by mouth   cyanocobalamin (VITAMIN B12) 1000 MCG tablet Take 1,000 mcg by mouth once daily   estradioL (ESTRACE) 0.5 MG tablet TAKE 1 TABLET BY MOUTH ONCE DAILY 90 tablet 3   gabapentin (NEURONTIN) 100 MG capsule Take 100 mg nightly for one week, then increase to 200mg  nightly. 60 capsule 2   multivitamin tablet Take 1 tablet by mouth once daily   nortriptyline (PAMELOR) 10 MG capsule Take 2 capsules (20 mg total) by mouth nightly Take 20 mg nightly. 180 capsule 1   olmesartan-hydrochlorothiazide (BENICAR HCT) 20-12.5 mg tablet TAKE 1 TABLET BY MOUTH ONCE DAILY 90  tablet 3   pantoprazole (PROTONIX) 40 MG DR tablet TAKE 1 TABLET BY MOUTH TWICE DAILY 180 tablet 3   pravastatin (PRAVACHOL) 20 MG tablet TAKE 1 TABLET BY MOUTH AT NIGHT 90 tablet 3   venlafaxine (EFFEXOR-XR) 150 MG XR capsule Take 1 capsule (150 mg total) by mouth once daily 90 capsule 1    Allergies:  Lipitor [Atorvastatin] Palpitations   Mobic [Meloxicam] Rash   Review of Systems:  A comprehensive 14 point ROS was performed, reviewed by me today, and the pertinent orthopaedic findings are documented in the HPI.  Physical Exam: BP 130/80  Ht 170.2 cm (5\' 7" )  Wt 81.1 kg (178 lb 12.8 oz)  BMI 28.00 kg/m  General/Constitutional: The patient appears to be well-nourished, well-developed, and in no acute distress. Neuro/Psych: Normal mood and affect, oriented to person, place and time. Eyes: Non-icteric. Pupils are equal, round, and reactive to light, and exhibit synchronous movement. ENT: Unremarkable. Lymphatic: No palpable adenopathy. Respiratory: Lungs clear to auscultation, Normal chest excursion, No wheezes and Non-labored breathing Cardiovascular: Regular rate and rhythm. No murmurs. and No edema, swelling or tenderness, except as noted in detailed exam. Integumentary: No impressive skin lesions present, except as noted in detailed exam. Musculoskeletal: Unremarkable, except as noted in detailed exam.  General: Well developed, well nourished 70 y.o. female in no apparent distress. Normal affect. Normal communication. Patient answers questions appropriately. The patient has a normal gait. There is no antalgic component. There is no hip lurch.   Right Lower Extremity: Examination of the right lower extremity reveals no bony abnormality, no edema, mild effusion and no ecchymosis. There is no valgus or varus abnormality. The patient is moderaly tender along the lateral joint line, and is moderately tender along the medial joint line. Patient has right knee extension -10 degrees, flexion 100 degrees, there is patellofemoral crepitus that can be palpated. Increased pain at the extremes of flexion. The patient has a positive rotational Mcmurray test. There is mild retropatellar discomfort. The patient has a negative patella stretch test. The patient has a negative varus  stress test and a negative valgus stress test, in looking for stability. The patient has a negative Lachman's test.  Vascular: The patient has a negative 73' test bilaterally. The patient had a normal dorsalis pedis and posterior tibial pulse. There is normal skin warmth. There is normal capillary refill bilaterally.   Neurologic: The patient has a negative straight leg raise. The patient has normal muscle strength testing for the quadriceps, calves, ankle dorsiflexion, ankle plantarflexion, and extensor hallicus longus. The patient has sensation that is intact to light touch. The deep tendon reflexes are normal at the patella and achilles. No clonus is noted.   Imaging: AP, lateral and sunrise views of the right knee were obtained today in the office and reviewed by me. These x-rays continue to demonstrate only minimal osteoarthritic changes to the right knee, the joint spaces appear to be relatively well-maintained. Slight loss of joint space both medially and laterally. There is no acute fracture, no lytic lesions identified. No significant osteophyte formation. The patella is located centrally within the patellofemoral groove. No lytic lesions identified. No significant knee effusion.  MRI OF THE RIGHT KNEE WITHOUT CONTRAST:  1. Mild-to-moderate tricompartmental degenerative changes with a  moderate size knee joint effusion. No acute osseous findings  identified.  2. Prominent degeneration of the medial meniscal body without  discrete meniscal tear or displaced meniscal fragment.  3. The lateral meniscus, cruciate and collateral ligaments appear  intact.   Impression: Primary osteoarthritis of right knee.  Plan:  1. Treatment options were discussed today with the patient. 2. The patient is quite frustrated by her continued right knee discomfort. 3. Had a detailed discussion on the risk and benefits of either a right total knee arthroplasty versus a right knee arthroscopy. After  discussion of the risk and benefits of both the patient would like to proceed with a right total knee arthroplasty. 4. Surgery will be performed by Dr. Joice Lofts. This document will serve as a surgical history and physical. 5. She will follow-up per standard postop protocol at this time. 6. She can contact the clinic if she has any questions, new symptoms develop or symptoms worsen.  The procedure was discussed with the patient, as were the potential risks (including bleeding, infection, nerve and/or blood vessel injury, persistent or recurrent pain, loosening of the hardware, instability, need for further surgery, blood clots, strokes, heart attacks and/or arhythmias, pneumonia, etc.) and benefits. The patient states her understanding and wishes to proceed.   H&P reviewed and patient re-examined. No changes.

## 2021-11-18 DIAGNOSIS — G929 Unspecified toxic encephalopathy: Secondary | ICD-10-CM | POA: Diagnosis not present

## 2021-11-18 DIAGNOSIS — T402X1A Poisoning by other opioids, accidental (unintentional), initial encounter: Secondary | ICD-10-CM | POA: Diagnosis not present

## 2021-11-18 LAB — CBC
HCT: 26.2 % — ABNORMAL LOW (ref 36.0–46.0)
Hemoglobin: 8.5 g/dL — ABNORMAL LOW (ref 12.0–15.0)
MCH: 28.6 pg (ref 26.0–34.0)
MCHC: 32.4 g/dL (ref 30.0–36.0)
MCV: 88.2 fL (ref 80.0–100.0)
Platelets: 259 10*3/uL (ref 150–400)
RBC: 2.97 MIL/uL — ABNORMAL LOW (ref 3.87–5.11)
RDW: 13.3 % (ref 11.5–15.5)
WBC: 14 10*3/uL — ABNORMAL HIGH (ref 4.0–10.5)
nRBC: 0 % (ref 0.0–0.2)

## 2021-11-18 LAB — BASIC METABOLIC PANEL
Anion gap: 5 (ref 5–15)
BUN: 21 mg/dL (ref 8–23)
CO2: 27 mmol/L (ref 22–32)
Calcium: 8.2 mg/dL — ABNORMAL LOW (ref 8.9–10.3)
Chloride: 103 mmol/L (ref 98–111)
Creatinine, Ser: 1.08 mg/dL — ABNORMAL HIGH (ref 0.44–1.00)
GFR, Estimated: 56 mL/min — ABNORMAL LOW (ref >60)
Glucose, Bld: 118 mg/dL — ABNORMAL HIGH (ref 70–99)
Potassium: 3.8 mmol/L (ref 3.5–5.1)
Sodium: 135 mmol/L (ref 135–145)

## 2021-11-18 MED ORDER — KETOROLAC TROMETHAMINE 15 MG/ML IJ SOLN
INTRAMUSCULAR | Status: AC
  Administered 2021-11-18: 7.5 mg via INTRAVENOUS
  Filled 2021-11-18: qty 1

## 2021-11-18 MED ORDER — OXYCODONE HCL 5 MG PO TABS
ORAL_TABLET | ORAL | Status: AC
  Administered 2021-11-18: 5 mg via ORAL
  Filled 2021-11-18: qty 1

## 2021-11-18 MED ORDER — VITAMIN D3 25 MCG (1000 UNIT) PO TABS
5000.0000 [IU] | ORAL_TABLET | Freq: Two times a day (BID) | ORAL | Status: DC
  Administered 2021-11-18: 5000 [IU] via ORAL
  Filled 2021-11-18 (×3): qty 5

## 2021-11-18 MED ORDER — ACETAMINOPHEN 500 MG PO TABS: 1000.0000 mg | ORAL_TABLET | Freq: Four times a day (QID) | ORAL | 0 refills | Status: AC | PRN

## 2021-11-18 MED ORDER — CEFAZOLIN SODIUM-DEXTROSE 2-4 GM/100ML-% IV SOLN
INTRAVENOUS | Status: AC
  Filled 2021-11-18: qty 100

## 2021-11-18 MED ORDER — OXYCODONE HCL 5 MG PO TABS
5.0000 mg | ORAL_TABLET | ORAL | 0 refills | Status: DC | PRN
Start: 2021-11-18 — End: 2021-11-23

## 2021-11-18 MED ORDER — TRAMADOL HCL 50 MG PO TABS: 50.0000 mg | ORAL_TABLET | Freq: Four times a day (QID) | ORAL | 0 refills | Status: DC | PRN

## 2021-11-18 MED ORDER — ACETAMINOPHEN 500 MG PO TABS
ORAL_TABLET | ORAL | Status: AC
  Administered 2021-11-18: 1000 mg via ORAL
  Filled 2021-11-18: qty 2

## 2021-11-18 MED ORDER — ONDANSETRON HCL 4 MG PO TABS: 4.0000 mg | ORAL_TABLET | Freq: Four times a day (QID) | ORAL | 0 refills | Status: DC | PRN

## 2021-11-18 MED ORDER — APIXABAN 2.5 MG PO TABS: 2.5000 mg | ORAL_TABLET | Freq: Two times a day (BID) | ORAL | 0 refills | Status: DC

## 2021-11-18 NOTE — Progress Notes (Signed)
Patient asleep. CPAP machine on. NAD.

## 2021-11-18 NOTE — Progress Notes (Signed)
  Subjective: 1 Day Post-Op Procedure(s) (LRB): TOTAL KNEE ARTHROPLASTY (Right) Patient reports pain as mild.   Patient is well, and has had no acute complaints or problems Plan is to go Home after hospital stay. Negative for chest pain and shortness of breath Fever: no Gastrointestinal:Negative for nausea and vomiting  Objective: Vital signs in last 24 hours: Temp:  [97.1 F (36.2 C)-98.1 F (36.7 C)] 97.8 F (36.6 C) (11/23 1000) Pulse Rate:  [66-100] 83 (11/23 1000) Resp:  [13-20] 20 (11/23 1000) BP: (100-162)/(58-94) 100/68 (11/23 1000) SpO2:  [89 %-100 %] 99 % (11/23 1000) Weight:  [81.6 kg] 81.6 kg (11/22 1210)  Intake/Output from previous day:  Intake/Output Summary (Last 24 hours) at 11/18/2021 1023 Last data filed at 11/17/2021 1800 Gross per 24 hour  Intake 1500 ml  Output 303 ml  Net 1197 ml    Intake/Output this shift: No intake/output data recorded.  Labs: Recent Labs    11/18/21 0539  HGB 8.5*   Recent Labs    11/18/21 0539  WBC 14.0*  RBC 2.97*  HCT 26.2*  PLT 259   Recent Labs    11/18/21 0539  NA 135  K 3.8  CL 103  CO2 27  BUN 21  CREATININE 1.08*  GLUCOSE 118*  CALCIUM 8.2*   No results for input(s): LABPT, INR in the last 72 hours.   EXAM General - Patient is Alert, Appropriate, and Oriented Extremity - ABD soft Neurovascular intact Dorsiflexion/Plantar flexion intact Incision: dressing C/D/I No cellulitis present Dressing/Incision - clean, dry, no drainage Motor Function - intact, moving foot and toes well on exam.  Abdomen soft with normal bowels sounds this morning.  Past Medical History:  Diagnosis Date   Asthma    COVID-19 11/2019   hospitalized x2   GERD (gastroesophageal reflux disease)    Headache    Hypertension    Sleep apnea     Assessment/Plan: 1 Day Post-Op Procedure(s) (LRB): TOTAL KNEE ARTHROPLASTY (Right) Principal Problem:   Status post total knee replacement using cement, right  Estimated  body mass index is 28.18 kg/m as calculated from the following:   Height as of this encounter: 5\' 7"  (1.702 m).   Weight as of this encounter: 81.6 kg. Advance diet Up with therapy D/C IV fluids when tolerating po intake.  Labs reviewed this AM. WBC 14.0.  Hg 8.5 this AM. Abdomen soft with intact bowel sounds.  No longer requiring supplemental O2. Patient has worked with PT this AM and is cleared for discharge home. Plan for discharge home this afternoon.  DVT Prophylaxis - Foot Pumps, TED hose, and Eliquis Weight-Bearing as tolerated to right leg  J. , PA-C [facility] 11/18/2021, 10:23 AM

## 2021-11-18 NOTE — TOC Transition Note (Signed)
Transition of Care [facility]) - CM/SW Discharge Note   Patient Details  Name: Jody Brewer MRN: [id number] Date of Birth: 1951/01/19  Transition of Care [facility]) CM/SW Contact:  Randalia Cellar, RN Phone Number: 11/18/2021, 1:26 PM   Clinical Narrative:    Notified patient refused bedside commode at discharge-reported she had one at home.   RN CM notified Rhonda @ [employer] so it could be picked up.          Patient Goals and CMS Choice        Discharge Placement                       Discharge Plan and Services                                     Social Determinants of Health (SDOH) Interventions     Readmission Risk Interventions No flowsheet data found.

## 2021-11-18 NOTE — Discharge Summary (Signed)
Physician Discharge Summary  Patient ID: Jody Brewer MRN: [id number] DOB/AGE: 1951-02-22 70 y.o.  Admit date: 11/17/2021 Discharge date: 11/18/2021  Admission Diagnoses:  Status post total knee replacement using cement, right [Z96.651]  Discharge Diagnoses: Patient Active Problem List   Diagnosis Date Noted   Status post total knee replacement using cement, right 11/17/2021   Chest pain 01/15/2020   ARF (acute renal failure) (HCC) 01/15/2020   SOB (shortness of breath)    COVID-19 12/27/2019   HTN (hypertension), benign 12/27/2019   Asthma, intermittent, uncomplicated 12/27/2019   Pneumonia due to COVID-19 virus 12/27/2019    Past Medical History:  Diagnosis Date   Asthma    COVID-19 11/2019   hospitalized x2   GERD (gastroesophageal reflux disease)    Headache    Hypertension    Sleep apnea      Transfusion: None.   Consultants (if any):   Discharged Condition: Improved  Hospital Course: Jody Brewer is an 70 y.o. female who was admitted 11/17/2021 with a diagnosis of degenerative joint disease of the right knee and went to the operating room on 11/17/2021 and underwent the above named procedures.    Surgeries: Procedure(s): TOTAL KNEE ARTHROPLASTY on 11/17/2021 Patient tolerated the surgery well. Taken to PACU where she was stabilized and then transferred to the orthopedic floor.  Started on Eliquis 2.5mg  q 12 hrs. Foot pumps applied bilaterally at 80 mm. Heels elevated on bed with rolled towels. No evidence of DVT. Negative Homan. Physical therapy started on day #1 for gait training and transfer. OT started day #1 for ADL and assisted devices.  Patient's IV was removed on POD1.  Implants: Right TKA using all-cemented [employer] Vanguard system with a 65 mm PCR femur, a 71 mm tibial tray with a 10 mm anterior stabilized E-poly insert, and a 34 x 7.8 mm all-poly 3-pegged domed patella.  She was given perioperative antibiotics:  Anti-infectives (From  admission, onward)    Start     Dose/Rate Route Frequency Ordered Stop   11/18/21 0201  ceFAZolin (ANCEF) 2-4 GM/100ML-% IVPB       Note to Pharmacy: Rayann Heman   : cabinet override      11/18/21 0201 11/18/21 0329   11/17/21 1400  ceFAZolin (ANCEF) IVPB 2g/100 mL premix        2 g 200 mL/hr over 30 Minutes Intravenous Every 6 hours 11/17/21 1140 11/18/21 0329   11/17/21 0626  ceFAZolin (ANCEF) 2-4 GM/100ML-% IVPB       Note to Pharmacy: Rayann Heman   : cabinet override      11/17/21 0626 11/17/21 1534   11/17/21 0600  ceFAZolin (ANCEF) IVPB 2g/100 mL premix        2 g 200 mL/hr over 30 Minutes Intravenous On call to O.R. 11/16/21 2303 11/17/21 0800     .  She was given sequential compression devices, early ambulation, and Eliquis for DVT prophylaxis.  She benefited maximally from the hospital stay and there were no complications.    Recent vital signs:  Vitals:   11/18/21 0400 11/18/21 1000  BP: (!) 105/58 100/68  Pulse: 81 83  Resp: 17 20  Temp: 98.1 F (36.7 C) 97.8 F (36.6 C)  SpO2: 100% 99%    Recent laboratory studies:  Lab Results  Component Value Date   HGB 8.5 (L) 11/18/2021   HGB 10.8 (L) 11/05/2021   HGB 12.1 01/16/2020   Lab Results  Component Value Date   WBC 14.0 (H) 11/18/2021  PLT 259 11/18/2021   Lab Results  Component Value Date   INR 1.0 01/15/2020   Lab Results  Component Value Date   NA 135 11/18/2021   K 3.8 11/18/2021   CL 103 11/18/2021   CO2 27 11/18/2021   BUN 21 11/18/2021   CREATININE 1.08 (H) 11/18/2021   GLUCOSE 118 (H) 11/18/2021    Discharge Medications:   Allergies as of 11/18/2021       Reactions   Atorvastatin Palpitations, Other (See Comments)   Muscle pain and severe twitching   Meloxicam Rash        Medication List     STOP taking these medications    ibuprofen 800 MG tablet Commonly known as: ADVIL       TAKE these medications    acetaminophen 500 MG tablet Commonly known as:  TYLENOL Take 2 tablets (1,000 mg total) by mouth every 6 (six) hours as needed.   albuterol 108 (90 Base) MCG/ACT inhaler Commonly known as: VENTOLIN HFA Inhale 2 puffs into the lungs every 4 (four) hours as needed for wheezing.   apixaban 2.5 MG Tabs tablet Commonly known as: ELIQUIS Take 1 tablet (2.5 mg total) by mouth 2 (two) times daily.   cetirizine 10 MG tablet Commonly known as: ZYRTEC Take 10 mg by mouth daily as needed for allergies.   cycloSPORINE 0.05 % ophthalmic emulsion Commonly known as: RESTASIS 1 drop 2 (two) times daily.   Dialyvite Vitamin D 5000 125 MCG (5000 UT) capsule Generic drug: Cholecalciferol Take 5,000 Units by mouth 2 (two) times daily.   estradiol 0.5 MG tablet Commonly known as: ESTRACE Take 0.5 mg by mouth daily.   gabapentin 100 MG capsule Commonly known as: NEURONTIN Take 300 mg by mouth at bedtime.   Magnesium 500 MG Tabs Take 500 mg by mouth every evening.   Multi-Vitamin tablet Take 1 tablet by mouth daily.   nortriptyline 10 MG capsule Commonly known as: PAMELOR Take 20 mg by mouth at bedtime.   olmesartan-hydrochlorothiazide 20-12.5 MG tablet Commonly known as: BENICAR HCT Take 1 tablet by mouth daily.   ondansetron 4 MG tablet Commonly known as: ZOFRAN Take 1 tablet (4 mg total) by mouth every 6 (six) hours as needed for nausea.   oxyCODONE 5 MG immediate release tablet Commonly known as: Oxy IR/ROXICODONE Take 1-2 tablets (5-10 mg total) by mouth every 4 (four) hours as needed for moderate pain (pain score 4-6).   pantoprazole 40 MG tablet Commonly known as: PROTONIX Take 40 mg by mouth 2 (two) times daily.   pravastatin 20 MG tablet Commonly known as: PRAVACHOL Take 20 mg by mouth at bedtime.   sodium chloride 0.65 % Soln nasal spray Commonly known as: OCEAN Place 1 spray into both nostrils as needed for congestion.   traMADol 50 MG tablet Commonly known as: ULTRAM Take 1 tablet (50 mg total) by mouth every  6 (six) hours as needed for moderate pain.   venlafaxine XR 150 MG 24 hr capsule Commonly known as: EFFEXOR-XR Take 150 mg by mouth daily with breakfast.   vitamin B-12 500 MCG tablet Commonly known as: CYANOCOBALAMIN Take 500 mcg by mouth daily.   vitamin E 180 MG (400 UNITS) capsule Take 400 Units by mouth 2 (two) times daily.               Durable Medical Equipment  (From admission, onward)           Start     Ordered  11/17/21 1141  DME Bedside commode  Once       Question:  Patient needs a bedside commode to treat with the following condition  Answer:  Status post total knee replacement using cement, right   11/17/21 1140   11/17/21 1141  DME 3 n 1  Once        11/17/21 1140   11/17/21 1141  DME Walker rolling  Once       Question Answer Comment  Walker: With 5 Inch Wheels   Patient needs a walker to treat with the following condition Status post total knee replacement using cement, right      11/17/21 1140            Diagnostic Studies: DG Knee Right Port  Result Date: 11/17/2021 CLINICAL DATA:  Status post right knee arthroplasty EXAM: PORTABLE RIGHT KNEE - 1-2 VIEW COMPARISON:  None. FINDINGS: AP and cross-table lateral views show evidence of recent right knee arthroplasty. There are pockets of air in the soft tissues. Skin staples are seen. IMPRESSION: Status post right knee arthroplasty. Electronically Signed   By: Ernie Avena M.D.   On: 11/17/2021 10:35    Disposition: Plan for discharge home this afternoon.    Follow-up Information     Anson [place], PA-C Follow up in 14 day(s).   Specialty: Physician Assistant Why: Mindi Slicker information: [address] [place] [address] [phone]                Signed: Meriel Pica PA-C 11/18/2021, 10:28 AM

## 2021-11-18 NOTE — Discharge Instructions (Signed)

## 2021-11-18 NOTE — Progress Notes (Signed)
Physical Therapy Treatment Patient Details Name: Jody Brewer MRN: [id number] DOB: 1951/12/13 Today's Date: 11/18/2021   History of Present Illness Pt is a 70 y.o. 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    PT Comments    Pt was long sitting in bed upon arriving. She is A and O x 4 and extremely pleasant. Endorsed 2/10 pain at rest that elevated to 4/10 with wt bearing activity. Overall rates pain,"not bad at all." She was easily and safely able to exit bed, stand, and ambulate with RW without LOB or safety concern. Safely performed ascending/descending stairs with no physical assistance required. Returned to room and performed AROM knee flex to 96 degrees. Still lacks ~ 3 degrees knee extension in long sitting. Overall pt is doing extremely well. Recommend continued skilled PT at DC to continue to progress strength, ROM, and safety with ADLs.    Recommendations for follow up therapy are one component of a multi-disciplinary discharge planning process, led by the attending physician.  Recommendations may be updated based on patient status, additional functional criteria and insurance authorization.  Follow Up Recommendations  Home health PT     Assistance Recommended at Discharge PRN  Equipment Recommendations  None recommended by PT (pt has recieved all personal equipment already)       Precautions / Restrictions Precautions Precautions: Fall;Knee Precaution Booklet Issued: Yes (comment) Restrictions Weight Bearing Restrictions: Yes RLE Weight Bearing: Weight bearing as tolerated     Mobility  Bed Mobility Overal bed mobility: Modified Independent Bed Mobility: Supine to Sit;Sit to Supine     Supine to sit: Modified independent (Device/Increase time) Sit to  supine: Modified independent (Device/Increase time)        Transfers Overall transfer level: Needs assistance Equipment used: Rolling walker (2 wheels) Transfers: Sit to/from Stand Sit to Stand: Modified independent (Device/Increase time)           General transfer comment: no physical assistance required to stand    Ambulation/Gait Ambulation/Gait assistance: Modified independent (Device/Increase time) Gait Distance (Feet): 200 Feet Assistive device: Rolling walker (2 wheels) Gait Pattern/deviations: Step-through pattern Gait velocity: WNL     General Gait Details: pt started with step to gait pattern however quickly progressed to step throughout without difficulty   Stairs Stairs: Yes Stairs assistance: Supervision Stair Management: Two rails Number of Stairs: 4 General stair comments: pt was easily and safely able to ascend/descend FOS withotu safety concern. pt states she feels "good" about performing stairs      Balance Overall balance assessment: Modified Independent         Cognition Arousal/Alertness: Awake/alert Behavior During Therapy: WFL for tasks assessed/performed Overall Cognitive Status: Within Functional Limits for tasks assessed      General Comments: Pt is A and O x 4        Exercises Total Joint Exercises Goniometric ROM: 3-96        Pertinent Vitals/Pain Pain Assessment: 0-10 Pain Score: 4  Pain Location: R knee Pain Descriptors / Indicators: Sore Pain Intervention(s): Limited activity within patient's tolerance;Monitored during session;Repositioned;Ice applied     PT Goals (current goals can now be found in the care plan section) Acute Rehab PT Goals Patient Stated Goal: to go home Progress towards PT goals: Progressing toward goals  Frequency    BID      PT Plan Current plan remains appropriate       AM-PAC PT "6 Clicks" Mobility   Outcome Measure  Help needed turning from your back to your side while in a  flat bed without using bedrails?: None Help needed moving from lying on your back to sitting on the side of a flat bed without using bedrails?: None Help needed moving to and from a bed to a chair (including a wheelchair)?: None Help needed standing up from a chair using your arms (e.g., wheelchair or bedside chair)?: None Help needed to walk in hospital room?: A Little Help needed climbing 3-5 steps with a railing? : A Little 6 Click Score: 22    End of Session Equipment Utilized During Treatment: Gait belt Activity Tolerance: Patient tolerated treatment well;No increased pain Patient left: in bed;with call bell/phone within reach;with bed alarm set Nurse Communication: Mobility status;Precautions;Weight bearing status PT Visit Diagnosis: Other abnormalities of gait and mobility (R26.89);Muscle weakness (generalized) (M62.81);Pain Pain - Right/Left: Right Pain - part of body: Knee     Time: 2778-2423 PT Time Calculation (min) (ACUTE ONLY): 24 min  Charges:  $Gait Training: 8-22 mins $Therapeutic Activity: 8-22 mins                     Jetta Lout PTA 11/18/21, 8:17 AM

## 2021-11-18 NOTE — Progress Notes (Signed)
Discharge order received. Patient mental status is at baseline. Vital signs stable . No signs of acute distress. Discharge instructions given. Patient verbalized understanding. No other issues noted at this time. Patient transported via staff and daughter is taking her home.

## 2021-11-18 NOTE — Evaluation (Signed)
Occupational Therapy Evaluation Patient Details Name: Jody Brewer MRN: [id number] DOB: [birth year] Today's Date: 11/18/2021   History of Present Illness Pt is a 70 y.o. 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   Clinical Impression   Pt seen for OT evaluation this date, POD#1 from above surgery. Pt was independent in all ADLs prior to surgery. Pt is eager to return to PLOF with less pain and improved safety and independence. Pt currently requires SUPERVISION/SET-UP assist for seated LB dressing with AD, MIN GUARD for toilet transfers, and SUPERVISION for standing grooming tasks due to pain and decreased balance. Pt instructed in polar care mgt, falls prevention strategies, home/routines modifications, and DME/AE for LB bathing and dressing tasks; handout provided and pt verbalized understanding. Pt would benefit from skilled OT services including additional instruction in techniques with or without assistive devices for dressing and bathing skills to support recall and carryover prior to discharge and ultimately to maximize safety, independence, and minimize falls risk and caregiver burden. Do not currently anticipate any OT needs following this hospitalization.        Recommendations for follow up therapy are one component of a multi-disciplinary discharge planning process, led by the attending physician.  Recommendations may be updated based on patient status, additional functional criteria and insurance authorization.   Follow Up Recommendations  No OT follow up    Assistance Recommended at Discharge Intermittent Supervision/Assistance  Functional Status Assessment  Patient has had a recent decline in their functional status and demonstrates the ability to make significant  improvements in function in a reasonable and predictable amount of time.  Equipment Recommendations  None recommended by OT (pt has all required DME at home)       Precautions / Restrictions Precautions Precautions: Fall;Knee  Restrictions Weight Bearing Restrictions: Yes RLE Weight Bearing: Weight bearing as tolerated      Mobility Bed Mobility Overal bed mobility: Modified Independent Bed Mobility: Supine to Sit;Sit to Supine     Supine to sit: Modified independent (Device/Increase time) Sit to supine: Modified independent (Device/Increase time)        Transfers Overall transfer level: Needs assistance Equipment used: Rolling walker (2 wheels) Transfers: Sit to/from Stand Sit to Stand: Min guard           General transfer comment: With use of bed rails, pt required no physical assistance to stand      Balance Overall balance assessment: Modified Independent Sitting-balance support: No upper extremity supported;Feet supported Sitting balance-Leahy Scale: Good Sitting balance - Comments: steady sitting reaching within BOS   Standing balance support: No upper extremity supported;During functional activity Standing balance-Leahy Scale: Good Standing balance comment: steady standing washing hands at sink                           ADL either performed or assessed with clinical judgement   ADL Overall ADL's : Needs assistance/impaired     Grooming: Wash/dry hands;Supervision/safety;Standing               Lower Body Dressing: Supervision/safety;Set up;Sitting/lateral leans;With adaptive equipment Lower Body Dressing Details (indicate cue type and reason): to don/doff socks with reacher and sock aide Toilet Transfer: Min guard;Ambulation;Comfort height toilet;Rolling walker (2 wheels);Grab bars   Toileting-  Clothing Manipulation and Hygiene: Modified independent;Sitting/lateral lean       Functional mobility during ADLs:  Supervision/safety;Rolling walker (2 wheels)       Vision Baseline Vision/History: 1 Wears glasses Ability to See in Adequate Light: 0 Adequate Patient Visual Report: No change from baseline              Pertinent Vitals/Pain Pain Assessment: 0-10 Pain Score: 2  Pain Location: R knee Pain Descriptors / Indicators: Sore Pain Intervention(s): Limited activity within patient's tolerance;Monitored during session;Repositioned        Extremity/Trunk Assessment Upper Extremity Assessment Upper Extremity Assessment: Overall WFL for tasks assessed   Lower Extremity Assessment Lower Extremity Assessment: RLE deficits/detail RLE Deficits / Details: s/p R TKA       Communication Communication Communication: HOH (pt reports being deaf R ear and partial hearing L ear)   Cognition Arousal/Alertness: Awake/alert Behavior During Therapy: WFL for tasks assessed/performed Overall Cognitive Status: Within Functional Limits for tasks assessed                                 General Comments: Pt is A and O x 4        Exercises Exercises: Total Joint Total Joint Exercises Goniometric ROM: 3-96 Other Exercises Other Exercises: Pt instructed in polar care mgt, falls prevention strategies, home/routines modifications, and DME/AE for LB bathing and dressing tasks; handout provided and pt verbalized understanding.    Home Living Family/patient expects to be discharged to:: Private residence Living Arrangements: Other relatives (pt's sister) Available Help at Discharge: Family;Available 24 hours/day (pt's sister available 24/7. Sister and nephew PRN) Type of Home: House Home Access: Stairs to enter [employer] of Steps: 1 step to enter home no railing   Home Layout: Two level;Other (Comment) (can stay on main level if needed) Alternate Level Stairs-Number of Steps: 12 steps with railing to pt's main living area (railing on R for 1st 6 steps and on L for next 6  steps)   Bathroom Shower/Tub: Walk-in shower (walk-in shower on 1st floor)   Bathroom Toilet:  (raised toilets on 1st and 2nd floor)     Home Equipment: Agricultural consultant (2 wheels);Shower seat;BSC/3in1;Cane - single point   Additional Comments: Pt's sister able to assist 24/7 and pt's daughter can come in/out to assist      Prior Functioning/Environment Prior Level of Function : Independent/Modified Independent             Mobility Comments: No recent falls. ADLs Comments: Independent with ADLs        OT Problem List: Impaired balance (sitting and/or standing);Decreased range of motion      OT Treatment/Interventions: Self-care/ADL training;Therapeutic exercise;DME and/or AE instruction;Therapeutic activities;Patient/family education;Balance training    OT Goals(Current goals can be found in the care plan section) Acute Rehab OT Goals Patient Stated Goal: to return home OT Goal Formulation: With patient Time For Goal Achievement: 12/02/21 Potential to Achieve Goals: Good ADL Goals Pt Will Perform Grooming: with modified independence;standing Pt Will Perform Lower Body Dressing: with modified independence;sit to/from stand Pt Will Transfer to Toilet: with modified independence;ambulating;regular height toilet  OT Frequency: Min 2X/week    AM-PAC OT "6 Clicks" Daily Activity     Outcome Measure Help from another person eating meals?: None Help from another person taking care of personal grooming?: A Little Help from another person toileting, which includes using toliet, bedpan, or urinal?: A Little Help from  another person bathing (including washing, rinsing, drying)?: A Little Help from another person to put on and taking off regular upper body clothing?: None Help from another person to put on and taking off regular lower body clothing?: A Little 6 Click Score: 20   End of Session Equipment Utilized During Treatment: Rolling walker (2 wheels) Nurse Communication:  Mobility status  Activity Tolerance: Patient tolerated treatment well Patient left: in bed;with call bell/phone within reach;with nursing/sitter in room;Other (comment) (with MD)  OT Visit Diagnosis: Unsteadiness on feet (R26.81)                Time: 7482-7078 OT Time Calculation (min): 26 min Charges:  OT General Charges $OT Visit: 1 Visit OT Evaluation $OT Eval Moderate Complexity: 1 Mod OT Treatments $Self Care/Home Management : 8-22 mins  Matthew Folks, OTR/L [facility] [phone]

## 2021-11-18 NOTE — Anesthesia Postprocedure Evaluation (Signed)
Anesthesia Post Note  Patient: Jody Brewer  Procedure(s) Performed: TOTAL KNEE ARTHROPLASTY (Right: Knee)  Patient location during evaluation: PACU Anesthesia Type: Spinal Level of consciousness: awake and alert, oriented and patient cooperative Pain management: pain level controlled Vital Signs Assessment: post-procedure vital signs reviewed and stable Respiratory status: spontaneous breathing, nonlabored ventilation and respiratory function stable Cardiovascular status: blood pressure returned to baseline and stable Postop Assessment: adequate PO intake Anesthetic complications: no   No notable events documented.   Last Vitals:  Vitals:   11/17/21 2000 11/18/21 0400  BP: (!) 162/94 (!) 105/58  Pulse: 66 81  Resp: 18 17  Temp: (!) 36.4 C 36.7 C  SpO2: 100% 100%    Last Pain:  Vitals:   11/18/21 0400  TempSrc: Temporal  PainSc:                  [name]

## 2021-11-20 ENCOUNTER — Other Ambulatory Visit: Payer: Self-pay

## 2021-11-20 ENCOUNTER — Emergency Department: Payer: Medicare Other

## 2021-11-20 ENCOUNTER — Encounter: Payer: Self-pay | Admitting: Emergency Medicine

## 2021-11-20 ENCOUNTER — Inpatient Hospital Stay
Admission: EM | Admit: 2021-11-20 | Discharge: 2021-11-23 | DRG: 917 | Disposition: A | Discharge status: Home Health | Payer: Medicare Other | Attending: Internal Medicine | Admitting: Internal Medicine

## 2021-11-20 DIAGNOSIS — G4730 Sleep apnea, unspecified: Secondary | ICD-10-CM | POA: Diagnosis present

## 2021-11-20 DIAGNOSIS — R400 Somnolence: Secondary | ICD-10-CM | POA: Diagnosis not present

## 2021-11-20 DIAGNOSIS — G929 Unspecified toxic encephalopathy: Secondary | ICD-10-CM | POA: Diagnosis present

## 2021-11-20 DIAGNOSIS — T402X1A Poisoning by other opioids, accidental (unintentional), initial encounter: Principal | ICD-10-CM | POA: Diagnosis present

## 2021-11-20 DIAGNOSIS — K219 Gastro-esophageal reflux disease without esophagitis: Secondary | ICD-10-CM | POA: Diagnosis present

## 2021-11-20 DIAGNOSIS — Z8616 Personal history of COVID-19: Secondary | ICD-10-CM

## 2021-11-20 DIAGNOSIS — Z825 Family history of asthma and other chronic lower respiratory diseases: Secondary | ICD-10-CM

## 2021-11-20 DIAGNOSIS — Z20822 Contact with and (suspected) exposure to covid-19: Secondary | ICD-10-CM | POA: Diagnosis present

## 2021-11-20 DIAGNOSIS — Z96651 Presence of right artificial knee joint: Secondary | ICD-10-CM

## 2021-11-20 DIAGNOSIS — F32A Depression, unspecified: Secondary | ICD-10-CM

## 2021-11-20 DIAGNOSIS — N179 Acute kidney failure, unspecified: Secondary | ICD-10-CM | POA: Diagnosis present

## 2021-11-20 DIAGNOSIS — G928 Other toxic encephalopathy: Secondary | ICD-10-CM | POA: Diagnosis present

## 2021-11-20 DIAGNOSIS — G9341 Metabolic encephalopathy: Secondary | ICD-10-CM

## 2021-11-20 DIAGNOSIS — I10 Essential (primary) hypertension: Secondary | ICD-10-CM | POA: Diagnosis present

## 2021-11-20 DIAGNOSIS — Z90710 Acquired absence of both cervix and uterus: Secondary | ICD-10-CM

## 2021-11-20 DIAGNOSIS — E860 Dehydration: Secondary | ICD-10-CM | POA: Diagnosis not present

## 2021-11-20 DIAGNOSIS — R4182 Altered mental status, unspecified: Secondary | ICD-10-CM

## 2021-11-20 DIAGNOSIS — I959 Hypotension, unspecified: Secondary | ICD-10-CM | POA: Diagnosis present

## 2021-11-20 DIAGNOSIS — Z8249 Family history of ischemic heart disease and other diseases of the circulatory system: Secondary | ICD-10-CM

## 2021-11-20 DIAGNOSIS — J9691 Respiratory failure, unspecified with hypoxia: Secondary | ICD-10-CM

## 2021-11-20 DIAGNOSIS — T402X5A Adverse effect of other opioids, initial encounter: Secondary | ICD-10-CM | POA: Diagnosis present

## 2021-11-20 DIAGNOSIS — Z635 Disruption of family by separation and divorce: Secondary | ICD-10-CM

## 2021-11-20 DIAGNOSIS — D649 Anemia, unspecified: Secondary | ICD-10-CM

## 2021-11-20 DIAGNOSIS — E785 Hyperlipidemia, unspecified: Secondary | ICD-10-CM

## 2021-11-20 DIAGNOSIS — E782 Mixed hyperlipidemia: Secondary | ICD-10-CM | POA: Diagnosis present

## 2021-11-20 DIAGNOSIS — Z7901 Long term (current) use of anticoagulants: Secondary | ICD-10-CM

## 2021-11-20 DIAGNOSIS — Z79899 Other long term (current) drug therapy: Secondary | ICD-10-CM

## 2021-11-20 DIAGNOSIS — Z9049 Acquired absence of other specified parts of digestive tract: Secondary | ICD-10-CM

## 2021-11-20 DIAGNOSIS — Z961 Presence of intraocular lens: Secondary | ICD-10-CM | POA: Diagnosis present

## 2021-11-20 DIAGNOSIS — Z981 Arthrodesis status: Secondary | ICD-10-CM

## 2021-11-20 DIAGNOSIS — D72829 Elevated white blood cell count, unspecified: Secondary | ICD-10-CM | POA: Diagnosis present

## 2021-11-20 DIAGNOSIS — Z9842 Cataract extraction status, left eye: Secondary | ICD-10-CM

## 2021-11-20 DIAGNOSIS — Z9841 Cataract extraction status, right eye: Secondary | ICD-10-CM

## 2021-11-20 DIAGNOSIS — R441 Visual hallucinations: Secondary | ICD-10-CM | POA: Diagnosis present

## 2021-11-20 DIAGNOSIS — Z888 Allergy status to other drugs, medicaments and biological substances status: Secondary | ICD-10-CM

## 2021-11-20 DIAGNOSIS — J45909 Unspecified asthma, uncomplicated: Secondary | ICD-10-CM | POA: Diagnosis present

## 2021-11-20 DIAGNOSIS — J9601 Acute respiratory failure with hypoxia: Secondary | ICD-10-CM | POA: Diagnosis present

## 2021-11-20 LAB — COMPREHENSIVE METABOLIC PANEL
ALT: 17 U/L (ref 0–44)
AST: 31 U/L (ref 15–41)
Albumin: 3.5 g/dL (ref 3.5–5.0)
Alkaline Phosphatase: 71 U/L (ref 38–126)
Anion gap: 7 (ref 5–15)
BUN: 30 mg/dL — ABNORMAL HIGH (ref 8–23)
CO2: 27 mmol/L (ref 22–32)
Calcium: 8.4 mg/dL — ABNORMAL LOW (ref 8.9–10.3)
Chloride: 100 mmol/L (ref 98–111)
Creatinine, Ser: 2.1 mg/dL — ABNORMAL HIGH (ref 0.44–1.00)
GFR, Estimated: 25 mL/min — ABNORMAL LOW (ref >60)
Glucose, Bld: 90 mg/dL (ref 70–99)
Potassium: 3.5 mmol/L (ref 3.5–5.1)
Sodium: 134 mmol/L — ABNORMAL LOW (ref 135–145)
Total Bilirubin: 1 mg/dL (ref 0.3–1.2)
Total Protein: 6.9 g/dL (ref 6.5–8.1)

## 2021-11-20 LAB — TROPONIN I (HIGH SENSITIVITY)
Troponin I (High Sensitivity): 5 ng/L (ref <18)
Troponin I (High Sensitivity): 5 ng/L (ref <18)

## 2021-11-20 LAB — CBC WITH DIFFERENTIAL/PLATELET
Abs Immature Granulocytes: 0.04 10*3/uL (ref 0.00–0.07)
Basophils Absolute: 0 10*3/uL (ref 0.0–0.1)
Basophils Relative: 0 %
Eosinophils Absolute: 0.1 10*3/uL (ref 0.0–0.5)
Eosinophils Relative: 1 %
HCT: 27.9 % — ABNORMAL LOW (ref 36.0–46.0)
Hemoglobin: 8.8 g/dL — ABNORMAL LOW (ref 12.0–15.0)
Immature Granulocytes: 0 %
Lymphocytes Relative: 17 %
Lymphs Abs: 1.9 10*3/uL (ref 0.7–4.0)
MCH: 28.2 pg (ref 26.0–34.0)
MCHC: 31.5 g/dL (ref 30.0–36.0)
MCV: 89.4 fL (ref 80.0–100.0)
Monocytes Absolute: 1.4 10*3/uL — ABNORMAL HIGH (ref 0.1–1.0)
Monocytes Relative: 13 %
Neutro Abs: 7.6 10*3/uL (ref 1.7–7.7)
Neutrophils Relative %: 69 %
Platelets: 289 10*3/uL (ref 150–400)
RBC: 3.12 MIL/uL — ABNORMAL LOW (ref 3.87–5.11)
RDW: 13.7 % (ref 11.5–15.5)
WBC: 11.1 10*3/uL — ABNORMAL HIGH (ref 4.0–10.5)
nRBC: 0 % (ref 0.0–0.2)

## 2021-11-20 LAB — RESP PANEL BY RT-PCR (FLU A&B, COVID) ARPGX2
Influenza A by PCR: NEGATIVE
Influenza B by PCR: NEGATIVE
SARS Coronavirus 2 by RT PCR: NEGATIVE

## 2021-11-20 LAB — GLUCOSE, CAPILLARY
Glucose-Capillary: 62 mg/dL — ABNORMAL LOW (ref 70–99)
Glucose-Capillary: 64 mg/dL — ABNORMAL LOW (ref 70–99)
Glucose-Capillary: 79 mg/dL (ref 70–99)

## 2021-11-20 LAB — ACETAMINOPHEN LEVEL: Acetaminophen (Tylenol), Serum: <10 ug/mL — ABNORMAL LOW (ref 10–30)

## 2021-11-20 LAB — BRAIN NATRIURETIC PEPTIDE: B Natriuretic Peptide: 27.9 pg/mL (ref 0.0–100.0)

## 2021-11-20 MED ORDER — LORAZEPAM 2 MG/ML IJ SOLN
1.0000 mg | Freq: Once | INTRAMUSCULAR | Status: AC
  Administered 2021-11-20: 1 mg via INTRAVENOUS
  Filled 2021-11-20: qty 1

## 2021-11-20 MED ORDER — ACETAMINOPHEN 325 MG PO TABS
650.0000 mg | ORAL_TABLET | Freq: Four times a day (QID) | ORAL | Status: DC | PRN
  Administered 2021-11-20 – 2021-11-23 (×6): 650 mg via ORAL
  Filled 2021-11-20 (×6): qty 2

## 2021-11-20 MED ORDER — ENOXAPARIN SODIUM 30 MG/0.3ML IJ SOSY: 30.0000 mg | PREFILLED_SYRINGE | INTRAMUSCULAR | Status: DC

## 2021-11-20 MED ORDER — NALOXONE HCL 0.4 MG/ML IJ SOLN: 0.1000 mg | Freq: Once | INTRAMUSCULAR | Status: DC

## 2021-11-20 MED ORDER — SODIUM CHLORIDE 0.9 % IV BOLUS
500.0000 mL | Freq: Once | INTRAVENOUS | Status: AC
  Administered 2021-11-20: 500 mL via INTRAVENOUS

## 2021-11-20 MED ORDER — SODIUM CHLORIDE 0.9 % IV SOLN: Freq: Once | INTRAVENOUS | Status: AC

## 2021-11-20 MED ORDER — NALOXONE HCL 2 MG/2ML IJ SOSY
PREFILLED_SYRINGE | INTRAMUSCULAR | Status: AC
  Administered 2021-11-20: 0.1 mg
  Filled 2021-11-20: qty 2

## 2021-11-20 NOTE — ED Notes (Signed)
RN to bedside to introduce self to pt. Pt was twitching and hard to arouse. Pt has pinpoint pupils. Pt on [place] but was not hooked up to oxygen. Pt in recliner. Attempted to move to bed but unsuccessful. Pt oxygen low at 85% on 2 liters. Increased to 6 lpm. Improved to 92%.

## 2021-11-20 NOTE — ED Provider Notes (Signed)
Emergency Medicine Provider Triage Evaluation Note  Jody Brewer , a 70 y.o. female  was evaluated in triage.  Pt complains of shortness of breath, low O2 sats.  Patient had surgery on Tuesday.  Review of Systems  Positive: Low oxygen level, recent surgery Negative: Fever, chills  Physical Exam  Pulse 96   Temp 98.5 F (36.9 C) (Oral)   SpO2 (!) 80% Comment: Pt placed on 2 liters with sats up to 95% Gen:   Awake, no distress   Resp:  Normal effort MSK:   Moves extremities without difficulty  Other:    Medical Decision Making  Medically screening exam initiated at 2:22 PM.  Appropriate orders placed.  TANEAL SONNTAG was informed that the remainder of the evaluation will be completed by another provider, this initial triage assessment does not replace that evaluation, and the importance of remaining in the ED until their evaluation is complete.  CTA ordered   Faythe Ghee, PA-C 11/20/21 1423    Minna Antis, MD 11/20/21 1451

## 2021-11-20 NOTE — Plan of Care (Signed)
Discussed briefly with Dr. Dorothea Glassman.  He describes irregular movements most consistent with a description of myoclonic jerks in a patient who has had excessive opiate use in the setting of recent knee surgery presenting with hypoxia and pinpoint pupils requiring Narcan and also found to have a significant acute kidney injury (Cr 2 from baseline of 1, GFR 25 from baseline of 56, BUN 30 from baseline of 13).  She has overall been improving but these movements are still present  Recommend observation and supportive care, low concern for seizure based on the description he provides. Please hold gabapentin as this can contribute to myoclonus/asterixis significantly especially in the setting of AKI Please minimize opiates as much as possible while addressing the patient's pain and note that hypoxia can also lead to myoclonic movement If patient is worsening despite supportive care, please do reach out to neurology, or if any other neurological concerns or questions arise Additional medical work-up and management of comorbidities per ED/primary team  This is a brief curbside evaluation only based on the information as provided by the ED provider and on a cursory chart review, please do reach out if additional concerns arise or if a full consult is needed  Brooke Dare MD-PhD [facility] [phone]  [facility] coverage for [facility] is from 8 AM to 4 AM in-house and 4 PM to 8 PM by telephone/video. 8 PM to 8 AM emergent questions or overnight urgent questions should be addressed to Teleneurology On-call or [facility] neurohospitalist; contact information can be found on AMION

## 2021-11-20 NOTE — ED Notes (Signed)
Rn called daughter. Daughter advised when pt was DC [facility] her pain medication PO was delayed. She went 8 hours without PO pain meds. When it was refilled and MD was called by daughter. Surgeon advised to double up on her pain meds briefly. Pt then began twitching last night and depressed respiratory rate. Surgeon was called again by daughter who told her this was all normal for the oxy. Today when pt became hypoxic at 70% daughter brought pt into ER.

## 2021-11-20 NOTE — ED Triage Notes (Signed)
Pt to ED via [facility] from home. Pt had knee surgery on Tuesday. Family reports low O2 sats today. Pt SpO2 in traige 80% on room air. Pt started on 2. Pt states that she does not know why she is here, states that her family wanted her to come in.

## 2021-11-20 NOTE — ED Provider Notes (Signed)
[facility] Emergency Department Provider Note   ____________________________________________   Event Date/Time   First MD Initiated Contact with Patient 11/20/21 1550     (approximate)  I have reviewed the triage vital signs and the nursing notes.   HISTORY  Chief Complaint Shortness of Breath    HPI Jody Brewer is a 70 y.o. female who had knee surgery on Tuesday.  Patient comes in brought by family for altered mental status and hypoxia.  Patient's oxygen was 85 on 2 L and goes up to 92 and 6.  Patient apparently did not get her oxycodone right away and so was told by her surgeon to double up on the oxycodone.  She is now somewhat groggy able to answer questions has very small pupils and is twitching occasionally arms legs whole body etc. I will jerk.  I discussed this with poison control they think it is possible she is having some sort of serotonergic problem possibly because she is taking gabapentin and nortriptyline and other medicines along with her narcotics.         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   Chest pain 01/15/2020   ARF (acute renal failure) (HCC) 01/15/2020   SOB (shortness of breath)    COVID-19 12/27/2019   HTN (hypertension), benign 12/27/2019   Asthma, intermittent, uncomplicated 12/27/2019   Pneumonia due to COVID-19 virus 12/27/2019    Past Surgical History:  Procedure Laterality Date   ABDOMINAL HYSTERECTOMY     APPENDECTOMY     CATARACT EXTRACTION W/PHACO Left 06/10/2021   Procedure: CATARACT EXTRACTION PHACO AND INTRAOCULAR LENS PLACEMENT (IOC) LEFT;  Surgeon: Lockie Mola, MD;  Location: [facility];  Service: Ophthalmology;   Laterality: Left;  5.69 0:53.6 10.6%   CATARACT EXTRACTION W/PHACO Right 06/24/2021   Procedure: CATARACT EXTRACTION PHACO AND INTRAOCULAR LENS PLACEMENT (IOC) RIGHT;  Surgeon: Lockie Mola, MD;  Location: [facility];  Service: Ophthalmology;  Laterality: Right;  3.61 00:52.4   CHOLECYSTECTOMY     ELBOW BURSA SURGERY Right    FRENULECTOMY, LINGUAL     and removal of portion of soft palate   LUMBAR DISC SURGERY  1996   L3-L5   LUMBAR DISC SURGERY  2004   LUMBAR FUSION  2006   bone harvest rods (2) and screws (36)   LUMBAR LAMINECTOMY  1998   L3-L5   OOPHORECTOMY     1 ovary removed   TOTAL KNEE ARTHROPLASTY Right 11/17/2021   Procedure: TOTAL KNEE ARTHROPLASTY;  Surgeon: [name], MD;  Location: [facility];  Service: Orthopedics;  Laterality: Right;   UVULOPALATOPHARYNGOPLASTY  2001    Prior to Admission medications   Medication Sig Start Date End Date Taking? Authorizing Provider  acetaminophen (TYLENOL) 500 MG tablet Take 2 tablets (1,000 mg total) by mouth every 6 (six) hours as needed. 11/18/21 11/18/22  Anson Oregon, PA-C  albuterol (VENTOLIN HFA) 108 (90 Base) MCG/ACT inhaler Inhale 2 puffs into the lungs every 4 (four) hours as needed for wheezing.    [provider]  apixaban (ELIQUIS) 2.5 MG TABS tablet Take 1 tablet (2.5 mg total) by mouth 2 (two) times daily. 11/18/21  Anson Oregon, PA-C  cetirizine (ZYRTEC) 10 MG tablet Take 10 mg by mouth daily as needed for allergies.    [provider]  Cholecalciferol (DIALYVITE VITAMIN D 5000) 125 MCG (5000 UT) capsule Take 5,000 Units by mouth 2 (two) times daily.    [provider]  cycloSPORINE (RESTASIS) 0.05 % ophthalmic emulsion 1 drop 2 (two) times daily.    [provider]  estradiol (ESTRACE) 0.5 MG tablet Take 0.5 mg by mouth daily. 10/15/19   [provider]  gabapentin (NEURONTIN) 100 MG capsule Take 300 mg by mouth at bedtime.    [provider]  Magnesium 500 MG TABS Take 500 mg by mouth every evening.    [provider]  Multiple Vitamin (MULTI-VITAMIN) tablet Take 1 tablet by mouth daily.    [provider]  nortriptyline (PAMELOR) 10 MG capsule Take 20 mg by mouth at bedtime.    [provider]  olmesartan-hydrochlorothiazide (BENICAR HCT) 20-12.5 MG tablet Take 1 tablet by mouth daily. 11/17/19   [provider]  ondansetron (ZOFRAN) 4 MG tablet Take 1 tablet (4 mg total) by mouth every 6 (six) hours as needed for nausea. 11/18/21   Anson Oregon, PA-C  oxyCODONE (OXY IR/ROXICODONE) 5 MG immediate release tablet Take 1-2 tablets (5-10 mg total) by mouth every 4 (four) hours as needed for moderate pain (pain score 4-6). 11/18/21   Anson Oregon, PA-C  pantoprazole (PROTONIX) 40 MG tablet Take 40 mg by mouth 2 (two) times daily. 10/15/19   [provider]  pravastatin (PRAVACHOL) 20 MG tablet Take 20 mg by mouth at bedtime.    [provider]  sodium chloride (OCEAN) 0.65 % SOLN nasal spray Place 1 spray into both nostrils as needed for congestion.    [provider]  traMADol (ULTRAM) 50 MG tablet Take 1 tablet (50 mg total) by mouth every 6 (six) hours as needed for moderate pain. 11/18/21   Anson Oregon, PA-C  venlafaxine XR (EFFEXOR-XR) 150 MG 24 hr capsule Take 150 mg by mouth daily with breakfast.    [provider]  vitamin B-12 (CYANOCOBALAMIN) 500 MCG tablet Take 500 mcg by mouth daily.    [provider]  vitamin E 400 UNIT capsule Take 400 Units by mouth 2 (two) times daily.    [provider]    Allergies Atorvastatin and Meloxicam  Family History  Problem Relation Age of Onset   Emphysema Mother    Parkinson's disease Father    Dementia Father    Hypertension Father     Social History Social History   Tobacco Use   Smoking status: Never   Smokeless tobacco: Never  Substance Use Topics    Alcohol use: Not Currently    Alcohol/week: 3.0 standard drinks    Types: 3 Glasses of wine per week   Drug use: Not Currently    Review of Systems  Unable to obtain due to altered mental status  ____________________________________________   PHYSICAL EXAM:  VITAL SIGNS: ED Triage Vitals  Enc Vitals Group     BP 11/20/21 1550 108/76     Pulse Rate 11/20/21 1414 96     Resp 11/20/21 1550 17     Temp 11/20/21 1416 98.5 F (36.9 C)     Temp Source 11/20/21 1416 Oral     SpO2 11/20/21 1414 (!) 80 %     Weight --      Height --  Head Circumference --      Peak Flow --      Pain Score --      Pain Loc --      Pain Edu? --      Excl. in GC? --     Constitutional: Awake but confused Eyes: Conjunctivae are normal.  Pupils are equal round and very small.  Minimally reactive to light Head: Atraumatic. Nose: No congestion/rhinnorhea. Mouth/Throat: Mucous membranes are moist.  Oropharynx non-erythematous. Neck: No stridor. Cardiovascular: Normal rate, regular rhythm. Grossly normal heart sounds.  Good peripheral circulation. Respiratory: Normal respiratory effort.  No retractions. Lungs CTAB. Gastrointestinal: Soft and nontender. No distention. No abdominal bruits.  Musculoskeletal: Right leg did have the knee replacement is somewhat diffusely swollen.  The knee is very painful.  Left leg is normal. Neurologic: The patient is able to answer simple questions occasionally.  She is easily distracted will stare off into space and twitch. Skin:  Skin is warm, dry and intact. No rash noted.   ____________________________________________   LABS (all labs ordered are listed, but only abnormal results are displayed)  Labs Reviewed  CBC WITH DIFFERENTIAL/PLATELET - Abnormal; Notable for the following components:      Result Value   WBC 11.1 (*)    RBC 3.12 (*)    Hemoglobin 8.8 (*)    HCT 27.9 (*)    Monocytes Absolute 1.4 (*)    All other components within normal limits   COMPREHENSIVE METABOLIC PANEL - Abnormal; Notable for the following components:   Sodium 134 (*)    BUN 30 (*)    Creatinine, Ser 2.10 (*)    Calcium 8.4 (*)    GFR, Estimated 25 (*)    All other components within normal limits  RESP PANEL BY RT-PCR (FLU A&B, COVID) ARPGX2  BRAIN NATRIURETIC PEPTIDE  ACETAMINOPHEN LEVEL  TROPONIN I (HIGH SENSITIVITY)  TROPONIN I (HIGH SENSITIVITY)   ____________________________________________  EKG   ____________________________________________  RADIOLOGY Jill Poling, personally viewed and evaluated these images (plain radiographs) as part of my medical decision making, as well as reviewing the written report by the radiologist.  ED MD interpretation: Chest x-ray reviewed by me read by radiology shows no acute pulmonary cardiopulmonary disease I am wondering if there may be a little increased vasculature.  CT read by radiology reviewed by me is negative  Official radiology report(s): DG Chest 2 View  Result Date: 11/20/2021 CLINICAL DATA:  Short of breath. EXAM: CHEST - 2 VIEW COMPARISON:  01/15/2020. FINDINGS: Cardiac silhouette is normal in size. No mediastinal or hilar masses or evidence of adenopathy. Mild medial lung base opacity consistent with atelectasis. Lungs otherwise clear. No pleural effusion or pneumothorax. Skeletal structures are demineralized. No acute findings. Previous thoracolumbar posterior fusion. IMPRESSION: No acute cardiopulmonary disease. Electronically Signed   By: Amie Portland M.D.   On: 11/20/2021 15:22   CT Head Wo Contrast  Result Date: 11/20/2021 CLINICAL DATA:  Altered mental status. EXAM: CT HEAD WITHOUT CONTRAST TECHNIQUE: Contiguous axial images were obtained from the base of the skull through the vertex without intravenous contrast. COMPARISON:  July 28, 2018. FINDINGS: Brain: No evidence of acute infarction, hemorrhage, hydrocephalus, extra-axial collection or mass lesion/mass effect. Vascular: No  hyperdense vessel or unexpected calcification. Skull: Normal. Negative for fracture or focal lesion. Sinuses/Orbits: No acute finding. Other: None. IMPRESSION: No acute intracranial abnormality seen. Electronically Signed   By: Lupita Raider M.D.   On: 11/20/2021 16:37   DG  Chest Portable 1 View  Result Date: 11/20/2021 CLINICAL DATA:  Pt SpO2 in traige 80% on room air Pt oxygen low at 85% on 2 liters. Increased to 6 lpm. Improved to 92%. Pt started on 2. Pt states that she does not know why she is here, states that her family wanted her to come in. Pt was twitching and hard to arouse. EXAM: PORTABLE CHEST 1 VIEW COMPARISON:  11/20/2021 at 3:05 p.m. FINDINGS: Cardiac silhouette is normal in size. No mediastinal or hilar masses. Mild linear lung base consistent with atelectasis or scarring, unchanged. Lungs otherwise clear. No convincing pleural effusion.  No pneumothorax IMPRESSION: No acute cardiopulmonary disease and no change from the earlier exam. Electronically Signed   By: Amie Portland M.D.   On: 11/20/2021 16:11    ____________________________________________   PROCEDURES  Procedure(s) performed (including Critical Care): Critical care time 30 minutes.  This includes researching the patient's symptoms online and speaking with [place] poison control and with neurology.  I also am going to speak with the hospitalist if she minutes about admitting her.  I also reviewed all of her lab work and examined her and spoke with her several times and with her daughter.  Procedures   ____________________________________________   INITIAL IMPRESSION / ASSESSMENT AND PLAN / ED COURSE   ----------------------------------------- 5:34 PM on 11/20/2021 ----------------------------------------- Patient altered with occasional twitches which I initially was afraid could be partial complex seizures.  Patient was given half a milligram of Ativan and really did not do much so after talking to poison  control we tried some Narcan.  Patient woke up with Narcan and became much more awake and alert.  Twitching continued but somewhat less so.  Poison control felt he could be toxic metabolic due to her medications neurology also feels this is likely the case.  They recommend watching her overnight and titrating her pain meds to control the pain but not knocked her out.  They wish to hold the gabapentin for tonight.             ____________________________________________   FINAL CLINICAL IMPRESSION(S) / ED DIAGNOSES  Final diagnoses:  Encephalopathy, metabolic  Encephalopathy, toxic     ED Discharge Orders     None        Note:  This document was prepared using Dragon voice recognition software and may include unintentional dictation errors.    Arnaldo Natal, MD 11/20/21 [phone]

## 2021-11-20 NOTE — H&P (Signed)
History and Physical  JOZALYN WALKENHORST MWN:[id number] DOB: February 03, 1951 DOA: 11/20/2021  Referring physician: Alan Mulder, MD PCP: Danella Penton, MD  Patient coming from: Home  Chief Complaint: Shortness of breath and altered mental status  HPI: Jody Brewer is a 70 y.o. female with medical history significant for hypertension, GERD, hyperlipidemia, sleep apnea on CPAP, s/p total right knee replacement (11/22) who presents to the emergency department via EMS due to altered mental status and lower level of oxygen.  Patient recently had a knee replacement 3 days ago (11/22), she was prescribed oxycodone, unfortunately she was unable to obtain this from pharmacy on time (due to some delay) and was in pain.  She was unable to provide history due to some confusion, history was obtained from ED physician and daughter at bedside.  Per daughter, patient's surgeon was called regarding pain control and was told to double up on the oxycodone, patient was compliant with the instruction, she was noted to have some twitching last night, but she went to bed with CPAP on as usual, on waking up this morning, she was lethargic, confused and the twitching worsened.  Oxygen level was also noted to be low in the 70s after CPAP was removed, so daughter activated EMS and patient was taken to the ED for further evaluation and management  ED Course:  In the emergency department, BP was soft at 87/36, patient was tachycardic, but other vital signs were within normal range.  Work-up in the ED showed leukocytosis, normocytic anemia, BUN/creatinine 30/2.10 (baseline creatinine at 1.1-1.2), troponin x1 was negative, BNP x1 was negative, Tylenol level was negative.  Influenza A, B, SARS coronavirus 2 was negative. CT of head without contrast showed no acute intracranial abnormality Chest x-ray showed no acute cardiopulmonary disease  Narcan was given and patient became more awake and alert.  Neurology was consulted and  thought this was due to toxic metabolites, Poison control was also consulted and thought it was due to toxic metabolic effect of medications.  Neurology recommended overnight monitoring and to titrate pain meds to control the pain.  Hospitalist was asked to admit patient for further evaluation and management.  Review of Systems: Constitutional: Negative for chills and fever.  HENT: Negative for ear pain and sore throat.   Eyes: Negative for pain and visual disturbance.  Respiratory: Positive for shortness of breath.  Negative for cough, chest tightness  Cardiovascular: Negative for chest pain and palpitations.  Gastrointestinal: Negative for abdominal pain and vomiting.  Endocrine: Negative for polyphagia and polyuria.  Genitourinary: Negative for decreased urine volume, dysuria, enuresis Musculoskeletal: Negative for arthralgias and back pain.  Skin: Negative for color change and rash.  Allergic/Immunologic: Negative for immunocompromised state.  Neurological: Positive for tiredness.  Negative for tremors, syncope, speech difficulty  Hematological: Does not bruise/bleed easily.  All other systems reviewed and are negative   Past Medical History:  Diagnosis Date   Asthma    COVID-19 11/2019   hospitalized x2   GERD (gastroesophageal reflux disease)    Headache    Hypertension    Sleep apnea    Past Surgical History:  Procedure Laterality Date   ABDOMINAL HYSTERECTOMY     APPENDECTOMY     CATARACT EXTRACTION W/PHACO Left 06/10/2021   Procedure: CATARACT EXTRACTION PHACO AND INTRAOCULAR LENS PLACEMENT (IOC) LEFT;  Surgeon: Lockie Mola, MD;  Location: [facility];  Service: Ophthalmology;  Laterality: Left;  5.69 0:53.6 10.6%   CATARACT EXTRACTION W/PHACO Right 06/24/2021  Procedure: CATARACT EXTRACTION PHACO AND INTRAOCULAR LENS PLACEMENT ([place]) RIGHT;  Surgeon: Leandrew Koyanagi, MD;  Location: [place];  Service: Ophthalmology;  Laterality: Right;   3.61 00:52.4   CHOLECYSTECTOMY     ELBOW BURSA SURGERY Right    FRENULECTOMY, LINGUAL     and removal of portion of soft palate   LUMBAR DISC SURGERY  1996   L3-L5   LUMBAR DISC SURGERY  2004   LUMBAR FUSION  2006   bone harvest rods (2) and screws (36)   LUMBAR LAMINECTOMY  1998   L3-L5   OOPHORECTOMY     1 ovary removed   TOTAL KNEE ARTHROPLASTY Right 11/17/2021   Procedure: TOTAL KNEE ARTHROPLASTY;  Surgeon: Corky Mull, MD;  Location: [facility];  Service: Orthopedics;  Laterality: Right;   UVULOPALATOPHARYNGOPLASTY  2001    Social History:  reports that she has never smoked. She has never used smokeless tobacco. She reports that she does not currently use alcohol after a past usage of about 3.0 standard drinks per week. She reports that she does not currently use drugs.   Allergies  Allergen Reactions   Atorvastatin Palpitations and Other (See Comments)    Muscle pain and severe twitching   Meloxicam Rash    Family History  Problem Relation Age of Onset   Emphysema Mother    Parkinson's disease Father    Dementia Father    Hypertension Father      Prior to Admission medications   Medication Sig Start Date End Date Taking? Authorizing Provider  acetaminophen (TYLENOL) 500 MG tablet Take 2 tablets (1,000 mg total) by mouth every 6 (six) hours as needed. 11/18/21 11/18/22  Lattie Corns, PA-C  albuterol (VENTOLIN HFA) 108 (90 Base) MCG/ACT inhaler Inhale 2 puffs into the lungs every 4 (four) hours as needed for wheezing.    [provider]  apixaban (ELIQUIS) 2.5 MG TABS tablet Take 1 tablet (2.5 mg total) by mouth 2 (two) times daily. 11/18/21   Lattie Corns, PA-C  cetirizine (ZYRTEC) 10 MG tablet Take 10 mg by mouth daily as needed for allergies.    [provider]  Cholecalciferol (DIALYVITE VITAMIN D 5000) 125 MCG (5000 UT) capsule Take 5,000 Units by mouth 2 (two) times daily.    [provider]  cycloSPORINE (RESTASIS) 0.05  % ophthalmic emulsion 1 drop 2 (two) times daily.    [provider]  estradiol (ESTRACE) 0.5 MG tablet Take 0.5 mg by mouth daily. 10/15/19   [provider]  gabapentin (NEURONTIN) 100 MG capsule Take 300 mg by mouth at bedtime.    [provider]  Magnesium 500 MG TABS Take 500 mg by mouth every evening.    [provider]  Multiple Vitamin (MULTI-VITAMIN) tablet Take 1 tablet by mouth daily.    [provider]  nortriptyline (PAMELOR) 10 MG capsule Take 20 mg by mouth at bedtime.    [provider]  olmesartan-hydrochlorothiazide (BENICAR HCT) 20-12.5 MG tablet Take 1 tablet by mouth daily. 11/17/19   [provider]  ondansetron (ZOFRAN) 4 MG tablet Take 1 tablet (4 mg total) by mouth every 6 (six) hours as needed for nausea. 11/18/21   Lattie Corns, PA-C  oxyCODONE (OXY IR/ROXICODONE) 5 MG immediate release tablet Take 1-2 tablets (5-10 mg total) by mouth every 4 (four) hours as needed for moderate pain (pain score 4-6). 11/18/21   Lattie Corns, PA-C  pantoprazole (PROTONIX) 40 MG tablet Take 40 mg  by mouth 2 (two) times daily. 10/15/19   [provider]  pravastatin (PRAVACHOL) 20 MG tablet Take 20 mg by mouth at bedtime.    [provider]  sodium chloride (OCEAN) 0.65 % SOLN nasal spray Place 1 spray into both nostrils as needed for congestion.    [provider]  traMADol (ULTRAM) 50 MG tablet Take 1 tablet (50 mg total) by mouth every 6 (six) hours as needed for moderate pain. 11/18/21   Anson [place], PA-C  venlafaxine XR (EFFEXOR-XR) 150 MG 24 hr capsule Take 150 mg by mouth daily with breakfast.    [provider]  vitamin B-12 (CYANOCOBALAMIN) 500 MCG tablet Take 500 mcg by mouth daily.    [provider]  vitamin E 400 UNIT capsule Take 400 Units by mouth 2 (two) times daily.    [provider]    Physical Exam: BP (!) 112/58   Pulse 98   Temp  98.5 F (36.9 C) (Oral)   Resp 14   SpO2 93%   General: 70 y.o. year-old female well developed well nourished in no acute distress.  Alert and oriented x3. HEENT: Dry mucous membrane.  Pinpoint pupils.  NCAT Neck: Supple, trachea medial Cardiovascular: Regular rate and rhythm with no rubs or gallops.  No thyromegaly or JVD noted.  No lower extremity edema. 2/4 pulses in all 4 extremities. Respiratory: Clear to auscultation with no wheezes or rales.  On supplemental oxygen via [place] at 2 LPM Abdomen: Soft, nontender nondistended with normal bowel sounds x4 quadrants. Muskuloskeletal: No cyanosis, clubbing or edema noted bilaterally Neuro: Occasional twitching at bedside.  CN II-XII intact, moving all 4 extremities.  Sensation, reflexes intact Skin: No ulcerative lesions noted or rashes Psychiatry: Mood is appropriate for condition and setting          Labs on Admission:  Basic Metabolic Panel: Recent Labs  Lab 11/18/21 0539 11/20/21 1421  NA 135 134*  K 3.8 3.5  CL 103 100  CO2 27 27  GLUCOSE 118* 90  BUN 21 30*  CREATININE 1.08* 2.10*  CALCIUM 8.2* 8.4*   Liver Function Tests: Recent Labs  Lab 11/20/21 1421  AST 31  ALT 17  ALKPHOS 71  BILITOT 1.0  PROT 6.9  ALBUMIN 3.5   No results for input(s): LIPASE, AMYLASE in the last 168 hours. No results for input(s): AMMONIA in the last 168 hours. CBC: Recent Labs  Lab 11/18/21 0539 11/20/21 1421  WBC 14.0* 11.1*  NEUTROABS  --  7.6  HGB 8.5* 8.8*  HCT 26.2* 27.9*  MCV 88.2 89.4  PLT 259 289   Cardiac Enzymes: No results for input(s): CKTOTAL, CKMB, CKMBINDEX, TROPONINI in the last 168 hours.  BNP (last 3 results) Recent Labs    11/20/21 1754  BNP 27.9    ProBNP (last 3 results) No results for input(s): PROBNP in the last 8760 hours.  CBG: No results for input(s): GLUCAP in the last 168 hours.  Radiological Exams on Admission: DG Chest 2 View  Result Date: 11/20/2021 CLINICAL DATA:  Short of breath.  EXAM: CHEST - 2 VIEW COMPARISON:  01/15/2020. FINDINGS: Cardiac silhouette is normal in size. No mediastinal or hilar masses or evidence of adenopathy. Mild medial lung base opacity consistent with atelectasis. Lungs otherwise clear. No pleural effusion or pneumothorax. Skeletal structures are demineralized. No acute findings. Previous thoracolumbar posterior fusion. IMPRESSION: No acute cardiopulmonary disease. Electronically Signed   By: Amie Portland M.D.   On: 11/20/2021 15:22  CT Head Wo Contrast  Result Date: 11/20/2021 CLINICAL DATA:  Altered mental status. EXAM: CT HEAD WITHOUT CONTRAST TECHNIQUE: Contiguous axial images were obtained from the base of the skull through the vertex without intravenous contrast. COMPARISON:  July 28, 2018. FINDINGS: Brain: No evidence of acute infarction, hemorrhage, hydrocephalus, extra-axial collection or mass lesion/mass effect. Vascular: No hyperdense vessel or unexpected calcification. Skull: Normal. Negative for fracture or focal lesion. Sinuses/Orbits: No acute finding. Other: None. IMPRESSION: No acute intracranial abnormality seen. Electronically Signed   By: Marijo Conception M.D.   On: 11/20/2021 16:37   DG Chest Portable 1 View  Result Date: 11/20/2021 CLINICAL DATA:  Pt SpO2 in traige 80% on room air Pt oxygen low at 85% on 2 liters. Increased to 6 lpm. Improved to 92%. Pt started on 2. Pt states that she does not know why she is here, states that her family wanted her to come in. Pt was twitching and hard to arouse. EXAM: PORTABLE CHEST 1 VIEW COMPARISON:  11/20/2021 at 3:05 p.m. FINDINGS: Cardiac silhouette is normal in size. No mediastinal or hilar masses. Mild linear lung base consistent with atelectasis or scarring, unchanged. Lungs otherwise clear. No convincing pleural effusion.  No pneumothorax IMPRESSION: No acute cardiopulmonary disease and no change from the earlier exam. Electronically Signed   By: Lajean Manes M.D.   On: 11/20/2021 16:11     EKG: I independently viewed the EKG done and my findings are as followed: Normal sinus rhythm at a rate of 77 bpm  Assessment/Plan Present on Admission:  Opioid overdose, accidental or unintentional, initial encounter ([place])  Principal Problem:   Opioid overdose, accidental or unintentional, initial encounter [facility]) Active Problems:   Essential hypertension   AKI (acute kidney injury) ([place])   S/P total knee replacement using cement, right   Leukocytosis   Normocytic anemia   Mixed hyperlipidemia   GERD (gastroesophageal reflux disease)   Sleep apnea   Respiratory failure with hypoxia (HCC)   Altered mental status   Dehydration  Altered mental status secondary to opioid overdose due to knee pain in the setting of s/p total right knee replacement Respiratory failure with hypoxia in setting of above-improved Patient took double dose of prescribed opioids with subsequent hypoxia, pinpoint pupils and altered mental status IV Narcan was given and patient quickly became more alert and awake Poison control and neurology consulted by ED physician recommended overnight observation and pain medication only when needed for pain Continue Tylenol as needed Continue Eliquis due to postsurgical knee surgery prophylaxis Continue fall precaution, aspiration precaution, seizure precaution and neurochecks  Leukocytosis possibly reactive WBC 11.1, continue to monitor WBC with morning labs  Acute kidney injury/dehydration BUN/creatinine 30/2.10 (baseline creatinine at 1.1-1.2) Continue gentle hydration Renally adjust medications, avoid nephrotoxic agents/dehydration/hypotension  Normocytic anemia Hemoglobin at 8.8, this was 10.8 on 11/05/2021 Patient denies any bleeding episode Continue vitamin B12 Continue to monitor hemoglobin with morning labs  Essential hypertension BP meds will be held at this time due to soft BP  Mixed hyperlipidemia Continue Pravachol  GERD Continue  Protonix  Sleep apnea on CPAP Continue CPAP  Other home meds: Albuterol as needed  DVT prophylaxis: Eliquis  Code Status: Full code  Family Communication: None at bedside  Disposition Plan:  Patient is from:                        home Anticipated DC to:  SNF or family members home Anticipated DC date:               2-3 days Anticipated DC barriers:          Patient requires inpatient management due to altered mental status secondary to parameters and acute kidney injury requiring IV hydration    Consults called: Neurology (by ED team)  Admission status: Observation    Bernadette Hoit MD [facility]   11/20/2021, 9:48 PM

## 2021-11-20 NOTE — ED Triage Notes (Signed)
Pt in via EMS from home. Pt had knee replacement Tuesday and now has low o2 sats. Pt was given 10mg  oxy for ever 6 hours.

## 2021-11-20 NOTE — ED Notes (Signed)
Report received from [place], RN

## 2021-11-21 DIAGNOSIS — G929 Unspecified toxic encephalopathy: Secondary | ICD-10-CM | POA: Diagnosis present

## 2021-11-21 DIAGNOSIS — Z8249 Family history of ischemic heart disease and other diseases of the circulatory system: Secondary | ICD-10-CM | POA: Diagnosis not present

## 2021-11-21 DIAGNOSIS — Z96651 Presence of right artificial knee joint: Secondary | ICD-10-CM | POA: Diagnosis present

## 2021-11-21 DIAGNOSIS — G9341 Metabolic encephalopathy: Secondary | ICD-10-CM | POA: Diagnosis not present

## 2021-11-21 DIAGNOSIS — J45909 Unspecified asthma, uncomplicated: Secondary | ICD-10-CM | POA: Diagnosis present

## 2021-11-21 DIAGNOSIS — G4730 Sleep apnea, unspecified: Secondary | ICD-10-CM | POA: Diagnosis present

## 2021-11-21 DIAGNOSIS — Z90710 Acquired absence of both cervix and uterus: Secondary | ICD-10-CM | POA: Diagnosis not present

## 2021-11-21 DIAGNOSIS — N179 Acute kidney failure, unspecified: Secondary | ICD-10-CM | POA: Diagnosis present

## 2021-11-21 DIAGNOSIS — K219 Gastro-esophageal reflux disease without esophagitis: Secondary | ICD-10-CM | POA: Diagnosis present

## 2021-11-21 DIAGNOSIS — G928 Other toxic encephalopathy: Secondary | ICD-10-CM | POA: Diagnosis present

## 2021-11-21 DIAGNOSIS — R441 Visual hallucinations: Secondary | ICD-10-CM | POA: Diagnosis present

## 2021-11-21 DIAGNOSIS — I959 Hypotension, unspecified: Secondary | ICD-10-CM | POA: Diagnosis present

## 2021-11-21 DIAGNOSIS — Z20822 Contact with and (suspected) exposure to covid-19: Secondary | ICD-10-CM | POA: Diagnosis present

## 2021-11-21 DIAGNOSIS — F32A Depression, unspecified: Secondary | ICD-10-CM | POA: Diagnosis present

## 2021-11-21 DIAGNOSIS — E860 Dehydration: Secondary | ICD-10-CM | POA: Diagnosis present

## 2021-11-21 DIAGNOSIS — D649 Anemia, unspecified: Secondary | ICD-10-CM | POA: Diagnosis present

## 2021-11-21 DIAGNOSIS — I10 Essential (primary) hypertension: Secondary | ICD-10-CM | POA: Diagnosis present

## 2021-11-21 DIAGNOSIS — T402X1A Poisoning by other opioids, accidental (unintentional), initial encounter: Secondary | ICD-10-CM | POA: Diagnosis present

## 2021-11-21 DIAGNOSIS — J9601 Acute respiratory failure with hypoxia: Secondary | ICD-10-CM | POA: Diagnosis present

## 2021-11-21 DIAGNOSIS — Z961 Presence of intraocular lens: Secondary | ICD-10-CM | POA: Diagnosis present

## 2021-11-21 DIAGNOSIS — T402X5A Adverse effect of other opioids, initial encounter: Secondary | ICD-10-CM | POA: Diagnosis present

## 2021-11-21 DIAGNOSIS — Z8616 Personal history of COVID-19: Secondary | ICD-10-CM | POA: Diagnosis not present

## 2021-11-21 DIAGNOSIS — Z9049 Acquired absence of other specified parts of digestive tract: Secondary | ICD-10-CM | POA: Diagnosis not present

## 2021-11-21 DIAGNOSIS — E782 Mixed hyperlipidemia: Secondary | ICD-10-CM | POA: Diagnosis present

## 2021-11-21 DIAGNOSIS — Z9841 Cataract extraction status, right eye: Secondary | ICD-10-CM | POA: Diagnosis not present

## 2021-11-21 DIAGNOSIS — Z9842 Cataract extraction status, left eye: Secondary | ICD-10-CM | POA: Diagnosis not present

## 2021-11-21 LAB — COMPREHENSIVE METABOLIC PANEL
ALT: 17 U/L (ref 0–44)
AST: 32 U/L (ref 15–41)
Albumin: 3 g/dL — ABNORMAL LOW (ref 3.5–5.0)
Alkaline Phosphatase: 62 U/L (ref 38–126)
Anion gap: 5 (ref 5–15)
BUN: 31 mg/dL — ABNORMAL HIGH (ref 8–23)
CO2: 28 mmol/L (ref 22–32)
Calcium: 8.2 mg/dL — ABNORMAL LOW (ref 8.9–10.3)
Chloride: 104 mmol/L (ref 98–111)
Creatinine, Ser: 1.4 mg/dL — ABNORMAL HIGH (ref 0.44–1.00)
GFR, Estimated: 41 mL/min — ABNORMAL LOW (ref >60)
Glucose, Bld: 94 mg/dL (ref 70–99)
Potassium: 3.6 mmol/L (ref 3.5–5.1)
Sodium: 137 mmol/L (ref 135–145)
Total Bilirubin: 1.1 mg/dL (ref 0.3–1.2)
Total Protein: 6.3 g/dL — ABNORMAL LOW (ref 6.5–8.1)

## 2021-11-21 LAB — CBC
HCT: 26.1 % — ABNORMAL LOW (ref 36.0–46.0)
Hemoglobin: 8.2 g/dL — ABNORMAL LOW (ref 12.0–15.0)
MCH: 28 pg (ref 26.0–34.0)
MCHC: 31.4 g/dL (ref 30.0–36.0)
MCV: 89.1 fL (ref 80.0–100.0)
Platelets: 261 10*3/uL (ref 150–400)
RBC: 2.93 MIL/uL — ABNORMAL LOW (ref 3.87–5.11)
RDW: 13.5 % (ref 11.5–15.5)
WBC: 8.1 10*3/uL (ref 4.0–10.5)
nRBC: 0 % (ref 0.0–0.2)

## 2021-11-21 LAB — GLUCOSE, CAPILLARY: Glucose-Capillary: 93 mg/dL (ref 70–99)

## 2021-11-21 LAB — MAGNESIUM: Magnesium: 2.3 mg/dL (ref 1.7–2.4)

## 2021-11-21 LAB — HIV ANTIBODY (ROUTINE TESTING W REFLEX): HIV Screen 4th Generation wRfx: NONREACTIVE

## 2021-11-21 LAB — PHOSPHORUS: Phosphorus: 3 mg/dL (ref 2.5–4.6)

## 2021-11-21 LAB — APTT: aPTT: 33 seconds (ref 24–36)

## 2021-11-21 MED ORDER — ALBUTEROL SULFATE (2.5 MG/3ML) 0.083% IN NEBU: 2.5000 mg | INHALATION_SOLUTION | RESPIRATORY_TRACT | Status: DC | PRN

## 2021-11-21 MED ORDER — CYANOCOBALAMIN 500 MCG PO TABS
500.0000 ug | ORAL_TABLET | Freq: Every day | ORAL | Status: DC
  Administered 2021-11-21 – 2021-11-23 (×3): 500 ug via ORAL
  Filled 2021-11-21 (×3): qty 1

## 2021-11-21 MED ORDER — CYCLOSPORINE 0.05 % OP EMUL
1.0000 [drp] | Freq: Two times a day (BID) | OPHTHALMIC | Status: DC
  Administered 2021-11-21 – 2021-11-22 (×4): 1 [drp] via OPHTHALMIC
  Filled 2021-11-21 (×7): qty 30

## 2021-11-21 MED ORDER — APIXABAN 2.5 MG PO TABS
2.5000 mg | ORAL_TABLET | Freq: Two times a day (BID) | ORAL | Status: DC
  Administered 2021-11-21 – 2021-11-23 (×5): 2.5 mg via ORAL
  Filled 2021-11-21 (×5): qty 1

## 2021-11-21 MED ORDER — TRAMADOL HCL 50 MG PO TABS
50.0000 mg | ORAL_TABLET | Freq: Four times a day (QID) | ORAL | Status: DC | PRN
  Administered 2021-11-21 – 2021-11-23 (×8): 50 mg via ORAL
  Filled 2021-11-21 (×8): qty 1

## 2021-11-21 MED ORDER — PANTOPRAZOLE SODIUM 40 MG PO TBEC
40.0000 mg | DELAYED_RELEASE_TABLET | Freq: Two times a day (BID) | ORAL | Status: DC
  Administered 2021-11-21 – 2021-11-23 (×5): 40 mg via ORAL
  Filled 2021-11-21 (×5): qty 1

## 2021-11-21 MED ORDER — NORTRIPTYLINE HCL 10 MG PO CAPS
20.0000 mg | ORAL_CAPSULE | Freq: Every day | ORAL | Status: DC
  Administered 2021-11-21 – 2021-11-22 (×2): 20 mg via ORAL
  Filled 2021-11-21 (×4): qty 2

## 2021-11-21 MED ORDER — SODIUM CHLORIDE 0.9 % IV SOLN: INTRAVENOUS | Status: DC

## 2021-11-21 MED ORDER — PRAVASTATIN SODIUM 20 MG PO TABS
20.0000 mg | ORAL_TABLET | Freq: Every day | ORAL | Status: DC
  Administered 2021-11-21 – 2021-11-22 (×2): 20 mg via ORAL
  Filled 2021-11-21 (×2): qty 1

## 2021-11-21 MED ORDER — CHLORHEXIDINE GLUCONATE CLOTH 2 % EX PADS
6.0000 | MEDICATED_PAD | Freq: Every day | CUTANEOUS | Status: DC
  Administered 2021-11-21 – 2021-11-22 (×2): 6 via TOPICAL

## 2021-11-21 MED ORDER — VENLAFAXINE HCL ER 75 MG PO CP24
150.0000 mg | ORAL_CAPSULE | Freq: Every day | ORAL | Status: DC
  Administered 2021-11-22 – 2021-11-23 (×2): 150 mg via ORAL
  Filled 2021-11-21 (×2): qty 2

## 2021-11-21 NOTE — Assessment & Plan Note (Signed)
On CPAP at home. Will continue. Monitor while on opioid therapy

## 2021-11-21 NOTE — Assessment & Plan Note (Addendum)
Outpatient follow-up with orthopedic surgery. Eliquis for DVT prophylaxis.

## 2021-11-21 NOTE — Hospital Course (Signed)
11/25.  Admitted for opioid OD.   11/26 transferred from stepdown to MedSurg.  Initiated on [employer].

## 2021-11-21 NOTE — Progress Notes (Signed)
Pt voided mx times on bedside commode. Pt is 1-2 person assist to commode. Pt unable to void via [place].    Pt remains on room air.   Pt given PRN Traumadol x2 and Tylenol x1 for acute surgical pain in right knee.   Pt eating/drinking & taking PO meds.   Family @ bedside.   Pt ox4, following commands. Waiting on bed to floor. Pt in no acute distress @ this time. Will continue to monitor.

## 2021-11-21 NOTE — Assessment & Plan Note (Signed)
Hypotensive on admission.  Currently remains soft.  Holding antihypertensive regimen.

## 2021-11-21 NOTE — Care Management Obs Status (Signed)
MEDICARE OBSERVATION STATUS NOTIFICATION   Patient Details  Name: Jody Brewer MRN: [id number] Date of Birth: 1951/01/26   Medicare Observation Status Notification Given:  Yes    Allayne Butcher, RN 11/21/2021, 1:44 PM

## 2021-11-21 NOTE — Assessment & Plan Note (Signed)
Secondary to opioids.

## 2021-11-21 NOTE — Assessment & Plan Note (Signed)
Resolved

## 2021-11-21 NOTE — Assessment & Plan Note (Signed)
Due to prolonged somnolence. Expecting rapid improvement.

## 2021-11-21 NOTE — TOC Initial Note (Signed)
Transition of Care [facility]) - Initial/Assessment Note    Patient Details  Name: Jody Brewer MRN: [id number] Date of Birth: 1951/06/20  [facility]) CM/SW Contact:    Shelbie Hutching, RN Phone Number: 11/21/2021, 1:55 PM  Clinical Narrative:                 Patient placed under observation for altered mental status and respiratory distress, unintentional opiate overdose.  Just discharged on 11/23 after Total knee replacement, there was a delay in getting her pain medication from the pharmacy and when she finally got it she was hurting so bad the medication could not catch up.  The orthopedic MD told them to double up on the pain medication but the patient did not tolerate that well.  RNCM met with patient and her son at the bedside and delivered [facility] letter.  Patient lives at home with her sister and her children, have been coming over checking in on her.  She has a walker.  She would like to talk with her family about discharge planning, [place] vs SNF, PT can re-evaluate her here in the hospital.    TOC will cont to follow.   Expected Discharge Plan: [place] Barriers to Discharge: Continued Medical Work up   Patient Goals and CMS Choice Patient states their goals for this hospitalization and ongoing recovery are:: Patient needs to talk with family about discharge plans CMS Medicare.gov Compare Post Acute Care list provided to:: Patient Choice offered to / list presented to : Patient  Expected Discharge Plan and Services Expected Discharge Plan: [place] Choice: Home Health, Resumption of Svcs/PTA Provider Living arrangements for the past 2 months: Single Family Home                 DME Arranged: N/A DME Agency: NA         HH Agency: [place] Date [place]: 11/21/21 Time [place]: [id number] Representative spoke with at [place]: [place]  Prior Living  Arrangements/Services Living arrangements for the past 2 months: [place] with:: Siblings Patient language and need for interpreter reviewed:: Yes Do you feel safe going back to the place where you live?: Yes      Need for Family Participation in Patient Care: Yes (Comment) Care giver support system in place?: Yes (comment) (sister, son, daughter) Current home services: DME (walker) Criminal Activity/Legal Involvement Pertinent to Current Situation/Hospitalization: No - Comment as needed  Activities of Daily Living      Permission Sought/Granted Permission sought to share information with : Case Manager, Family Supports, Other (comment) Permission granted to share information with : Yes, Verbal Permission Granted  Share Information with NAME: Emmaline Life  Permission granted to share info w AGENCY: [facility]  Permission granted to share info w Relationship: daughter  Permission granted to share info w Contact Information: [phone]  Emotional Assessment Appearance:: Appears stated age Attitude/Demeanor/Rapport: Engaged Affect (typically observed): Accepting Orientation: : Oriented to Self, Oriented to Place, Oriented to  Time, Oriented to Situation Alcohol / Substance Use: Not Applicable Psych Involvement: No (comment)  Admission diagnosis:  Encephalopathy, toxic [G92.9] Encephalopathy, metabolic [F57.32] Opioid overdose, accidental or unintentional, initial encounter [facility]) [T40.2X1A] Patient Active Problem List   Diagnosis Date Noted   Opioid overdose, accidental or unintentional, initial encounter ([place]) 11/20/2021   Leukocytosis 11/20/2021   Normocytic anemia 11/20/2021   Mixed hyperlipidemia  11/20/2021   GERD (gastroesophageal reflux disease) 11/20/2021   Sleep apnea 11/20/2021   Respiratory failure with hypoxia ([place]) 11/20/2021   Altered mental status 11/20/2021   Dehydration 11/20/2021   S/P total knee replacement using cement, right 11/17/2021    Chest pain 01/15/2020   AKI (acute kidney injury) ([place]) 01/15/2020   SOB (shortness of breath)    COVID-19 12/27/2019   Essential hypertension 12/27/2019   Asthma, intermittent, uncomplicated 70/78/6754   Pneumonia due to COVID-19 virus 12/27/2019   PCP:  Rusty Aus, MD Pharmacy:   [facility] [place], [address] AT [address] [place] [address] Phone: [phone] Fax: [phone]     Social Determinants of Health (SDOH) Interventions    Readmission Risk Interventions No flowsheet data found.

## 2021-11-21 NOTE — Assessment & Plan Note (Signed)
Baseline hemoglobin around 12.  Postop hemoglobin dropped to around 8.5.  Currently hemoglobin around 8.2.  Anticipate dilutional anemia tomorrow.

## 2021-11-21 NOTE — Progress Notes (Signed)
Neuro status has greatly improved over the night, she is now oriented x4, does not recall how she got here or what happened to bring her in, but she does recall bits of conversation from the ER. Urine output has been decent, except she has not been able to urinate on her own, I have straight cathed her twice now, this last time I got 750 ml out, she was able to tell that her bladder was full and uncomfortable this time, but could not urinate. She does think that she could urinate easier if I got her up, but I was reluctant to do that yet considering the muscle twitching and confusion from before.

## 2021-11-21 NOTE — Assessment & Plan Note (Signed)
Last night per daughter patient continues to have some hallucination of people being around in the room and conversing with them. Monitor closely overnight.

## 2021-11-21 NOTE — Assessment & Plan Note (Signed)
Essentially secondary to misunderstanding of instruction that they received.  Per daughter they were instructed to take 3 tablets of oxycodone 2 tablets of tramadol alternatively every 2 hours.  They kept on taking this despite pain being well controlled thinking that they need to keep taking this medication despite pain being controlled. Initially required CPAP.  IV Narcan.  With improvement in mentation and blood pressure. Currently mentation improved significantly but still not back to baseline. Gently introduce opioids again and monitor.

## 2021-11-21 NOTE — Assessment & Plan Note (Signed)
Oxygenation was in the 70s when EMS arrived on CPAP. Currently on room air.

## 2021-11-21 NOTE — Assessment & Plan Note (Signed)
Continue statin. 

## 2021-11-21 NOTE — Assessment & Plan Note (Signed)
Likely from poor p.o. intake. Will initiate IV hydration. Baseline serum creatinine 1-1 0.2. On presentation serum creatinine 2.10 currently 1.4. Still with poor p.o. intake due to confusion therefore at risk for further worsening.

## 2021-11-21 NOTE — Progress Notes (Signed)
[facility] Progress Note  Patient: Jody Brewer    BSW:[id number]  DOA: 11/20/2021    Date of Service: the patient was seen and examined on 11/21/2021  Brief hospital course: 11/25.  Admitted for opioid OD.   11/26 transferred from stepdown to MedSurg.  Initiated on Norco.  Assessment and Plan: * Opioid overdose, accidental or unintentional, initial encounter [facility]) Essentially secondary to misunderstanding of instruction that they received.  Per daughter they were instructed to take 3 tablets of oxycodone 2 tablets of tramadol alternatively every 2 hours.  They kept on taking this despite pain being well controlled thinking that they need to keep taking this medication despite pain being controlled. Initially required CPAP.  IV Narcan.  With improvement in mentation and blood pressure. Currently mentation improved significantly but still not back to baseline. Gently introduce opioids again and monitor.  Visual hallucination Last night per daughter patient continues to have some hallucination of people being around in the room and conversing with them. Monitor closely overnight.  Toxic encephalopathy Secondary to opioids.  S/P total knee replacement using cement, right Outpatient follow-up with orthopedic surgery. Eliquis for DVT prophylaxis.  Dehydration Due to prolonged somnolence. Expecting rapid improvement.  Acute respiratory failure with hypoxia (HCC) Oxygenation was in the 70s when EMS arrived on CPAP. Currently on room air.  AKI (acute kidney injury) (HCC) Likely from poor p.o. intake. Will initiate IV hydration. Baseline serum creatinine 1-1 0.2. On presentation serum creatinine 2.10 currently 1.4. Still with poor p.o. intake due to confusion therefore at risk for further worsening.  Leukocytosis Resolved.  Normocytic anemia Baseline hemoglobin around 12.  Postop hemoglobin dropped to around 8.5.  Currently hemoglobin around 8.2.  Anticipate  dilutional anemia tomorrow.  Sleep apnea On CPAP at home. Will continue. Monitor while on opioid therapy  Mixed hyperlipidemia Continue statin  GERD (gastroesophageal reflux disease) Continue PPI.  Essential hypertension Hypotensive on admission.  Currently remains soft.  Holding antihypertensive regimen.    There is no height or weight on file to calculate BMI.        Subjective: No acute complaint.  No nausea no vomiting.  Overnight had hallucination.  Minimal oral intake.  Still fatigued and weak.  Objective: Blood pressure soft.  Exam: General: Appear in mild distress, no Rash; Oral Mucosa Clear, moist. no Abnormal Neck Mass Or lumps, Conjunctiva normal  Cardiovascular: S1 and S2 Present, no Murmur, Respiratory: normal respiratory effort, Bilateral Air entry present and faint crackles, no wheezes Abdomen: Bowel Sound present, Soft and no tenderness Extremities: trace Pedal edema Neurology: alert and oriented to time, place, and person affect appropriate. no new focal deficit, no asterixis.  No further myoclonic jerks. Gait not checked due to patient safety concerns    Data Reviewed: My review of labs, imaging, notes and other tests is significant for     improving but still elevated renal function.  Disposition:  Status is: Inpatient  Remains inpatient appropriate because: Poor p.o. intake, mentation not back to baseline, need for IV hydration due to AKI.  Need for close monitoring while reinitiating on opioid therapy.  Family Communication: Family at bedside.  All questions answered satisfactorily.  DVT Prophylaxis: apixaban (ELIQUIS) tablet 2.5 mg Start: 11/21/21 1000 SCDs Start: 11/20/21 2016 apixaban (ELIQUIS) tablet 2.5 mg   Time spent: 35 minutes.   Author: Lynden Oxford  11/21/2021 4:23 PM  To reach On-call, see care teams to locate the attending and reach out via [email or web address]. Between 7PM-7AM, please contact night-coverage If  you still have  difficulty reaching the attending provider, please page the [facility] (Director on Call) for [facility] on amion for assistance.

## 2021-11-21 NOTE — Progress Notes (Signed)
At 0500 while doing bladder scan, I informed the patient that SCD's had been ordered and that I could take her ted hose off and place the compression devices and explained what they would do. She stated that she would prefer to keep her TED hose on with ace wrap since that is how the orthopedic MD told her to stay.

## 2021-11-21 NOTE — Assessment & Plan Note (Signed)
Continue PPI ?

## 2021-11-21 NOTE — Progress Notes (Signed)
Upon receiving patient from ER, was notified by nurse that patient had not urinated yet during ER stay. Bladder scanned patient, there was greater than 320 in bladder. Performed in and out catheter due to volume being over 300, drained 650 ml's of clear yellow urine. Replaced purewick and will recheck bladder at 0500. Notified NP Jon Billings.

## 2021-11-22 DIAGNOSIS — R441 Visual hallucinations: Secondary | ICD-10-CM

## 2021-11-22 DIAGNOSIS — E785 Hyperlipidemia, unspecified: Secondary | ICD-10-CM

## 2021-11-22 DIAGNOSIS — G9341 Metabolic encephalopathy: Secondary | ICD-10-CM

## 2021-11-22 DIAGNOSIS — F32A Depression, unspecified: Secondary | ICD-10-CM

## 2021-11-22 LAB — CBC
HCT: 26.4 % — ABNORMAL LOW (ref 36.0–46.0)
Hemoglobin: 8.6 g/dL — ABNORMAL LOW (ref 12.0–15.0)
MCH: 28.4 pg (ref 26.0–34.0)
MCHC: 32.6 g/dL (ref 30.0–36.0)
MCV: 87.1 fL (ref 80.0–100.0)
Platelets: 299 10*3/uL (ref 150–400)
RBC: 3.03 MIL/uL — ABNORMAL LOW (ref 3.87–5.11)
RDW: 13.3 % (ref 11.5–15.5)
WBC: 7 10*3/uL (ref 4.0–10.5)
nRBC: 0 % (ref 0.0–0.2)

## 2021-11-22 LAB — BASIC METABOLIC PANEL
Anion gap: 6 (ref 5–15)
BUN: 16 mg/dL (ref 8–23)
CO2: 28 mmol/L (ref 22–32)
Calcium: 8.4 mg/dL — ABNORMAL LOW (ref 8.9–10.3)
Chloride: 104 mmol/L (ref 98–111)
Creatinine, Ser: 0.71 mg/dL (ref 0.44–1.00)
GFR, Estimated: >60 mL/min (ref >60)
Glucose, Bld: 104 mg/dL — ABNORMAL HIGH (ref 70–99)
Potassium: 3.4 mmol/L — ABNORMAL LOW (ref 3.5–5.1)
Sodium: 138 mmol/L (ref 135–145)

## 2021-11-22 MED ORDER — OXYCODONE HCL 5 MG PO TABS
2.5000 mg | ORAL_TABLET | Freq: Once | ORAL | Status: AC
  Administered 2021-11-22: 15:00:00 2.5 mg via ORAL
  Filled 2021-11-22: qty 1

## 2021-11-22 NOTE — Progress Notes (Signed)
Patient ID: Jody Brewer, female   DOB: 03/15/1951, 70 y.o.   MRN: [id number] [facility] PROGRESS NOTE  IDY RAWLING [id number]:[id number] DOB: [birth year] DOA: 11/20/2021 PCP: Danella Penton, MD  HPI/Subjective: Patient had a recent right knee replacement.  She did not have pain medications for 7 hours and was told to double up on the pain medications and then was brought in with altered mental status.  Daughter states mental status is almost back to baseline but not quite.  Still having some right knee pain.  Objective: Vitals:   11/22/21 0847 11/22/21 1237  BP: 140/63 (!) 149/70  Pulse:  89  Resp:  18  Temp: 98.2 F (36.8 C) 98.3 F (36.8 C)  SpO2:  100%    Intake/Output Summary (Last 24 hours) at 11/22/2021 1407 Last data filed at 11/22/2021 0800 Gross per 24 hour  Intake 1538.11 ml  Output 1010 ml  Net 528.11 ml   There were no vitals filed for this visit.  ROS: Review of Systems  Respiratory:  Negative for shortness of breath.   Cardiovascular:  Negative for chest pain.  Gastrointestinal:  Negative for abdominal pain, nausea and vomiting.  Musculoskeletal:  Positive for joint pain.  Exam: Physical Exam HENT:     Head: Normocephalic.     Mouth/Throat:     Pharynx: No oropharyngeal exudate.  Eyes:     General: Lids are normal.     Conjunctiva/sclera: Conjunctivae normal.  Cardiovascular:     Rate and Rhythm: Normal rate and regular rhythm.     Heart sounds: Normal heart sounds, S1 normal and S2 normal.  Pulmonary:     Breath sounds: Normal breath sounds. No decreased breath sounds, wheezing, rhonchi or rales.  Abdominal:     Palpations: Abdomen is soft.     Tenderness: There is no abdominal tenderness.  Musculoskeletal:     Right lower leg: No swelling.     Left lower leg: No swelling.  Skin:    General: Skin is warm.     Findings: No rash.  Neurological:     Mental Status: She is alert.     Comments: Answers questions appropriately.       Scheduled Meds:  apixaban  2.5 mg Oral BID   Chlorhexidine Gluconate Cloth  6 each Topical Daily   cycloSPORINE  1 drop Both Eyes BID   nortriptyline  20 mg Oral QHS   oxyCODONE  2.5 mg Oral Once   pantoprazole  40 mg Oral BID   pravastatin  20 mg Oral QHS   venlafaxine XR  150 mg Oral Q breakfast   vitamin B-12  500 mcg Oral Daily   Continuous Infusions:  sodium chloride 30 mL/hr at 11/22/21 1134    Assessment/Plan:  Acute metabolic encephalopathy secondary to accidental opioid overdose.  Initially required IV Narcan.  Mental status improved but not totally quite back to baseline.  Try to convert to Tylenol as quickly as possible. Visual hallucination likely secondary to pain medications Recent right knee replacement.  Careful with pain medications. Acute kidney injury.  Creatinine 2.10 on presentation and down to 0.71.  Decrease rate of IV fluids. Sleep apnea on CPAP at home Hyperlipidemia unspecified on pravastatin Depression on Effexor GERD on PPI Low-dose Eliquis for anticoagulation Weakness.  Physical therapy recommending rehab.  Will consult transitional care team.        Code Status:     Code Status Orders  (From admission, onward)  Start     Ordered   11/20/21 2016  Full code  Continuous        11/20/21 2015           Code Status History     Date Active Date Inactive Code Status Order ID Comments User Context   11/17/2021 1141 11/18/2021 2046 Full Code [id number]  [name], MD Inpatient   01/15/2020 2325 01/17/2020 2026 Full Code [id number]  Eduard Clos, MD ED   01/10/2020 0117 01/11/2020 2227 Full Code [id number]  Mansy, Vernetta Honey, MD ED   12/27/2019 1614 12/28/2019 1803 Full Code [id number]  Danford, Earl Lites, MD ED      Advance Directive Documentation    Flowsheet Row Most Recent Value  Type of Advance Directive Healthcare Power of Attorney  Pre-existing out of facility DNR order (yellow form or pink MOST form) --   "MOST" Form in Place? --      Family Communication: Spoke with daughter at the bedside Disposition Plan: Status is: Inpatient  Consultants: Orthopedic surgery  Time spent: 28 minutes  Joann [employer]

## 2021-11-22 NOTE — Consult Note (Signed)
ORTHOPAEDIC CONSULTATION  REQUESTING PHYSICIAN: Alford Highland, MD  Chief Complaint:   Mental status changes secondary to apparent accidental opioid overdose status post right TKA.  History of Present Illness: Jody Brewer is a 70 y.o. female with a history of gastroesophageal reflux disease, hypertension, sleep apnea, and asthma who is now 5 days status post a right total knee arthroplasty.  The patient was discharged home on the first postoperative day and apparently was doing well for the first several days postoperatively.  However, as the local anesthetic medication wore off, she began to experience increased pain.  She called to the on-call provider for our practice who recommended that she increase her dosage of pain medication.  Apparently she did so, but may have actually been taking even more than was recommended.  Regardless, she was found to be quite confused and lethargic yesterday morning by her daughter so she was brought to the emergency room.  The patient was diagnosed with an accidental opioid overdose and given Narcan which did improve her lethargy somewhat.  She also was noted to have a low oxygen saturation.  Therefore, the patient was admitted to the ICU last night for close monitoring.  The patient does note a moderate pain in her knee, but denies falling or any other injury to the knee.  She also denies any fevers or chills.  Past Medical History:  Diagnosis Date   Asthma    COVID-19 11/2019   hospitalized x2   GERD (gastroesophageal reflux disease)    Headache    Hypertension    Sleep apnea    Past Surgical History:  Procedure Laterality Date   ABDOMINAL HYSTERECTOMY     APPENDECTOMY     CATARACT EXTRACTION W/PHACO Left 06/10/2021   Procedure: CATARACT EXTRACTION PHACO AND INTRAOCULAR LENS PLACEMENT (IOC) LEFT;  Surgeon: Lockie Mola, MD;  Location: [facility];  Service:  Ophthalmology;  Laterality: Left;  5.69 0:53.6 10.6%   CATARACT EXTRACTION W/PHACO Right 06/24/2021   Procedure: CATARACT EXTRACTION PHACO AND INTRAOCULAR LENS PLACEMENT (IOC) RIGHT;  Surgeon: Lockie Mola, MD;  Location: [facility];  Service: Ophthalmology;  Laterality: Right;  3.61 00:52.4   CHOLECYSTECTOMY     ELBOW BURSA SURGERY Right    FRENULECTOMY, LINGUAL     and removal of portion of soft palate   LUMBAR DISC SURGERY  1996   L3-L5   LUMBAR DISC SURGERY  2004   LUMBAR FUSION  2006   bone harvest rods (2) and screws (36)   LUMBAR LAMINECTOMY  1998   L3-L5   OOPHORECTOMY     1 ovary removed   TOTAL KNEE ARTHROPLASTY Right 11/17/2021   Procedure: TOTAL KNEE ARTHROPLASTY;  Surgeon: [name], MD;  Location: [facility] OR[facility];  Service: Orthopedics;  Laterality: Right;   UVULOPALATOPHARYNGOPLASTY  2001   Social History   Socioeconomic History   Marital status: Legally Separated    Spouse name: Not on file   Number of children: Not on file   Years of education: Not on file   Highest education level: Not on file  Occupational History   Not on file  Tobacco Use   Smoking status: Never   Smokeless tobacco: Never  Substance and Sexual Activity   Alcohol use: Not Currently    Alcohol/week: 3.0 standard drinks    Types: 3 Glasses of wine per week   Drug use: Not Currently   Sexual activity: Not on file  Other Topics Concern   Not on file  Social History Narrative   Not on file   Social Determinants of Health   Financial Resource Strain: Not on file  Food Insecurity: Not on file  Transportation Needs: Not on file  Physical Activity: Not on file  Stress: Not on file  Social Connections: Not on file   Family History  Problem Relation Age of Onset   Emphysema Mother    Parkinson's disease Father    Dementia Father    Hypertension Father    Allergies  Allergen Reactions   Atorvastatin Palpitations and Other (See Comments)    Muscle pain and severe  twitching   Meloxicam Rash   Prior to Admission medications   Medication Sig Start Date End Date Taking? Authorizing Provider  acetaminophen (TYLENOL) 500 MG tablet Take 2 tablets (1,000 mg total) by mouth every 6 (six) hours as needed. 11/18/21 11/18/22 Yes Anson Oregon, PA-C  apixaban (ELIQUIS) 2.5 MG TABS tablet Take 1 tablet (2.5 mg total) by mouth 2 (two) times daily. 11/18/21  Yes Anson Oregon, PA-C  Cholecalciferol (DIALYVITE VITAMIN D 5000) 125 MCG (5000 UT) capsule Take 5,000 Units by mouth 2 (two) times daily.   Yes [provider]  cycloSPORINE (RESTASIS) 0.05 % ophthalmic emulsion 1 drop 2 (two) times daily.   Yes [provider]  estradiol (ESTRACE) 0.5 MG tablet Take 0.5 mg by mouth daily. 10/15/19  Yes [provider]  gabapentin (NEURONTIN) 100 MG capsule Take 300 mg by mouth at bedtime.   Yes [provider]  Magnesium 500 MG TABS Take 500 mg by mouth every evening.   Yes [provider]  Multiple Vitamin (MULTI-VITAMIN) tablet Take 1 tablet by mouth daily.   Yes [provider]  nortriptyline (PAMELOR) 10 MG capsule Take 20 mg by mouth at bedtime.   Yes [provider]  olmesartan-hydrochlorothiazide (BENICAR HCT) 20-12.5 MG tablet Take 1 tablet by mouth daily. 11/17/19  Yes [provider]  oxyCODONE (OXY IR/ROXICODONE) 5 MG immediate release tablet Take 1-2 tablets (5-10 mg total) by mouth every 4 (four) hours as needed for moderate pain (pain score 4-6). 11/18/21  Yes Anson Oregon, PA-C  pantoprazole (PROTONIX) 40 MG tablet Take 40 mg by mouth 2 (two) times daily. 10/15/19  Yes [provider]  pravastatin (PRAVACHOL) 20 MG tablet Take 20 mg by mouth at bedtime.   Yes [provider]  venlafaxine XR (EFFEXOR-XR) 150 MG 24 hr capsule Take 150 mg by mouth daily with breakfast.   Yes [provider]  vitamin B-12 (CYANOCOBALAMIN) 500 MCG tablet Take 500 mcg by  mouth daily.   Yes [provider]  vitamin E 400 UNIT capsule Take 400 Units by mouth 2 (two) times daily.   Yes [provider]  albuterol (VENTOLIN HFA) 108 (90 Base) MCG/ACT inhaler Inhale 2 puffs into the lungs every 4 (four) hours as needed for wheezing.    [provider]  cetirizine (ZYRTEC) 10 MG tablet Take 10 mg by mouth daily as needed for allergies.    [provider]  ondansetron (ZOFRAN) 4 MG tablet Take 1 tablet (4 mg total) by mouth every 6 (six) hours as needed for nausea. 11/18/21   Anson Oregon, PA-C  sodium chloride (OCEAN) 0.65 % SOLN nasal spray Place 1 spray into both nostrils as needed for congestion.    [provider]  traMADol (ULTRAM) 50 MG tablet Take 1 tablet (50 mg total) by mouth every 6 (six) hours as needed for  moderate pain. 11/18/21   Anson Oregon, PA-C   DG Chest 2 View  Result Date: 11/20/2021 CLINICAL DATA:  Short of breath. EXAM: CHEST - 2 VIEW COMPARISON:  01/15/2020. FINDINGS: Cardiac silhouette is normal in size. No mediastinal or hilar masses or evidence of adenopathy. Mild medial lung base opacity consistent with atelectasis. Lungs otherwise clear. No pleural effusion or pneumothorax. Skeletal structures are demineralized. No acute findings. Previous thoracolumbar posterior fusion. IMPRESSION: No acute cardiopulmonary disease. Electronically Signed   By: Amie Portland M.D.   On: 11/20/2021 15:22   CT Head Wo Contrast  Result Date: 11/20/2021 CLINICAL DATA:  Altered mental status. EXAM: CT HEAD WITHOUT CONTRAST TECHNIQUE: Contiguous axial images were obtained from the base of the skull through the vertex without intravenous contrast. COMPARISON:  July 28, 2018. FINDINGS: Brain: No evidence of acute infarction, hemorrhage, hydrocephalus, extra-axial collection or mass lesion/mass effect. Vascular: No hyperdense vessel or unexpected calcification. Skull: Normal. Negative for fracture or focal  lesion. Sinuses/Orbits: No acute finding. Other: None. IMPRESSION: No acute intracranial abnormality seen. Electronically Signed   By: Lupita Raider M.D.   On: 11/20/2021 16:37   DG Chest Portable 1 View  Result Date: 11/20/2021 CLINICAL DATA:  Pt SpO2 in traige 80% on room air Pt oxygen low at 85% on 2 liters. Increased to 6 lpm. Improved to 92%. Pt started on 2. Pt states that she does not know why she is here, states that her family wanted her to come in. Pt was twitching and hard to arouse. EXAM: PORTABLE CHEST 1 VIEW COMPARISON:  11/20/2021 at 3:05 p.m. FINDINGS: Cardiac silhouette is normal in size. No mediastinal or hilar masses. Mild linear lung base consistent with atelectasis or scarring, unchanged. Lungs otherwise clear. No convincing pleural effusion.  No pneumothorax IMPRESSION: No acute cardiopulmonary disease and no change from the earlier exam. Electronically Signed   By: Amie Portland M.D.   On: 11/20/2021 16:11    Positive ROS: All other systems have been reviewed and were otherwise negative with the exception of those mentioned in the HPI and as above.  Physical Exam: General:  Alert, no acute distress Psychiatric:  Patient appears to be somewhat confused this morning, but otherwise exhibits normal mood and affect   Cardiovascular:  No pedal edema Respiratory:  No wheezing, non-labored breathing GI:  Abdomen is soft and non-tender Skin:  No lesions in the area of chief complaint Neurologic:  Sensation intact distally Lymphatic:  No axillary or cervical lymphadenopathy  Orthopedic Exam:  Orthopedic examination is limited to the right knee and lower extremity.  There is a small amount of dried serosanguineous drainage on the OpSite dressing anteriorly, but otherwise the wound appears to be healing well and is without any evidence for infection.  She does have moderate swelling around the knee, as well as a small effusion, which is expected this soon after surgery.  She is able  to perform an active straight leg raise with some difficulty.  She lacks 10 degrees of extension but the knee can be flexed beyond 50 degrees.  The knee is stable to varus and valgus stressing.  She is neurovascularly intact to the right lower extremity and foot.  Assessment: Apparent accidental drug overdose status post recent right TKA.  Plan: The treatment options have been discussed with the patient and the daughter who is at the bedside.  The patient and her daughter understand that it may take a couple of days for the medications to clear  out of her system.  Meanwhile, we will try to control her pain with the minimum amount of pain medication necessary.  She may be restarted with formal physical therapy while in the hospital, working on gait and transfer training weightbearing as tolerated to the right leg with a walker.  Range of motion and strength exercises also may be started.  Thank you for asking me to participate in the care of this most pleasant yet unfortunate woman.  I will be happy to follow her with you.   Maryagnes Amos, MD  Beeper #:  [phone]  11/22/2021 2:12 PM

## 2021-11-22 NOTE — Evaluation (Signed)
Physical Therapy Evaluation Patient Details Name: Jody Brewer MRN: [id number] DOB: 07-03-1951 Today's Date: 11/22/2021  History of Present Illness  Pt is a 70 y.o. female presenting with accidental opioid overdose d/t misunderstading of instruction. She was admitted with visual hallucinations, toxic encephalopathy, and dehydration. 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 She was d/c'd on 11/23 home with family care and readmitted on 11/25 d/t the listed concern above. 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  Clinical Impression  The pt is presenting in good spirits this session. She reports improved mentation when compared to earlier in the day. The pt was able to tolerate ambulation of 100', however reported increased pain and demonstrates significant gait deviations. The pt has 12 steps to maneuver for safe home d/c and is unable to do so safely at this time. PT recommends post acute rehab in order to optimize mobility and progress functional independence.      Recommendations for follow up therapy are one component of a multi-disciplinary discharge planning process, led by the attending physician.  Recommendations may be updated based on patient status, additional functional criteria and insurance authorization.  Follow Up Recommendations Skilled nursing-short term rehab (<3 hours/day)    Assistance Recommended at Discharge Intermittent Supervision/Assistance  Functional Status Assessment Patient has had a recent decline in their functional status and demonstrates the ability to make significant improvements in function in a reasonable and predictable amount of time.  Equipment Recommendations  None recommended by PT    Recommendations for Other Services       Precautions / Restrictions Precautions Precautions: Fall;Knee Precaution Booklet Issued: Yes  (comment) Precaution Comments: booklet readministered. Restrictions Weight Bearing Restrictions: No RLE Weight Bearing: Weight bearing as tolerated      Mobility  Bed Mobility Overal bed mobility: Modified Independent Bed Mobility: Supine to Sit;Sit to Supine     Supine to sit: Modified independent (Device/Increase time) Sit to supine: Modified independent (Device/Increase time)        Transfers Overall transfer level: Needs assistance Equipment used: Rolling walker (2 wheels) Transfers: Sit to/from Stand Sit to Stand: Min assist                Ambulation/Gait Ambulation/Gait assistance: Min guard Gait Distance (Feet): 100 Feet Assistive device: Rolling walker (2 wheels) Gait Pattern/deviations: Step-to pattern          Stairs            Wheelchair Mobility    Modified Rankin (Stroke Patients Only)       Balance Overall balance assessment: Needs assistance Sitting-balance support: No upper extremity supported;Feet supported Sitting balance-Leahy Scale: Good     Standing balance support: Bilateral upper extremity supported;During functional activity Standing balance-Leahy Scale: Fair                               Pertinent Vitals/Pain Pain Assessment: 0-10 Pain Score: 3  Pain Location: R knee Pain Descriptors / Indicators: Sore Pain Intervention(s): Limited activity within patient's tolerance;Patient requesting pain meds-RN notified    Home Living Family/patient expects to be discharged to:: Private residence Living Arrangements: Other relatives Available Help at Discharge: Family;Available 24 hours/day Type of Home: House Home Access: Stairs to enter Entrance Stairs-Rails: None Entrance Stairs-Number of Steps: 1 step to enter home no railing Alternate Level  Stairs-Number of Steps: 12 steps with railing to pt's main living area (railing on R for 1st 6 steps and on L for next 6 steps) Home Layout: Two level;Other  (Comment);Able to live on main level with bedroom/bathroom Home Equipment: Rolling Walker (2 wheels);Shower seat;BSC/3in1;Cane - single point Additional Comments: Pt's sister able to assist 24/7 and pt's daughter can come in/out to assist    Prior Function Prior Level of Function : Independent/Modified Independent             Mobility Comments: No recent falls. ADLs Comments: Independent with ADLs     Hand Dominance        Extremity/Trunk Assessment   Upper Extremity Assessment Upper Extremity Assessment: Overall WFL for tasks assessed    Lower Extremity Assessment Lower Extremity Assessment: Overall WFL for tasks assessed RLE Deficits / Details: s/p R TKA RLE: Unable to fully assess due to pain    Cervical / Trunk Assessment Cervical / Trunk Assessment: Normal  Communication   Communication: No difficulties  Cognition Arousal/Alertness: Awake/alert Behavior During Therapy: WFL for tasks assessed/performed Overall Cognitive Status: Within Functional Limits for tasks assessed                                          General Comments      Exercises     Assessment/Plan    PT Assessment Patient needs continued PT services  PT Problem List Decreased strength;Decreased range of motion;Decreased activity tolerance;Decreased balance;Decreased mobility;Decreased knowledge of use of DME;Decreased knowledge of precautions;Pain;Decreased skin integrity       PT Treatment Interventions DME instruction;Gait training;Stair training;Functional mobility training;Therapeutic activities;Therapeutic exercise;Balance training;Patient/family education    PT Goals (Current goals can be found in the Care Plan section)  Acute Rehab PT Goals Patient Stated Goal: control pain PT Goal Formulation: With patient/family Time For Goal Achievement: 12/06/21 Potential to Achieve Goals: Good    Frequency 7X/week   Barriers to discharge        Co-evaluation                AM-PAC PT "6 Clicks" Mobility  Outcome Measure Help needed turning from your back to your side while in a flat bed without using bedrails?: None Help needed moving from lying on your back to sitting on the side of a flat bed without using bedrails?: A Little Help needed moving to and from a bed to a chair (including a wheelchair)?: A Little Help needed standing up from a chair using your arms (e.g., wheelchair or bedside chair)?: A Little Help needed to walk in hospital room?: A Little Help needed climbing 3-5 steps with a railing? : A Lot 6 Click Score: 18    End of Session   Activity Tolerance: Patient tolerated treatment well Patient left: in bed;with call bell/phone within reach;with family/visitor present Nurse Communication: Mobility status PT Visit Diagnosis: Other abnormalities of gait and mobility (R26.89);Muscle weakness (generalized) (M62.81);Pain Pain - Right/Left: Right Pain - part of body: Knee    Time: 3151-7616 PT Time Calculation (min) (ACUTE ONLY): 38 min   Charges:   PT Evaluation $PT Eval Moderate Complexity: 1 Mod PT Treatments $Gait Training: 23-37 mins        1:54 PM, 11/22/21 Kailie Polus A. Mordecai Maes PT, DPT Physical Therapist - [facility]   Aurore Redinger A Kenyette Gundy 11/22/2021, 1:52 PM

## 2021-11-22 NOTE — Progress Notes (Signed)
PT Cancellation Note  Patient Details Name: MARKERIA GOETSCH MRN: [id number] DOB: [birth year]   Cancelled Treatment:    Reason Eval/Treat Not Completed: Other (comment) (Pt sleeping; family refusing therapy, does not want to wake patient at this time.)   Triton Heidrich A Demitris Pokorny 11/22/2021, 12:23 PM

## 2021-11-23 DIAGNOSIS — G929 Unspecified toxic encephalopathy: Secondary | ICD-10-CM

## 2021-11-23 LAB — HEMOGLOBIN: Hemoglobin: 8 g/dL — ABNORMAL LOW (ref 12.0–15.0)

## 2021-11-23 MED ORDER — ONDANSETRON HCL 4 MG/2ML IJ SOLN
4.0000 mg | INTRAMUSCULAR | Status: DC | PRN
  Administered 2021-11-23: 07:00:00 4 mg via INTRAVENOUS
  Filled 2021-11-23: qty 2

## 2021-11-23 NOTE — Progress Notes (Signed)
Physical Therapy Treatment Patient Details Name: Jody Brewer MRN: [id number] DOB: [birth year] Today's Date: 11/23/2021   History of Present Illness Pt is a 70 y.o. female presenting with accidental opioid overdose d/t misunderstading of instruction. She was admitted with visual hallucinations, toxic encephalopathy, and dehydration. 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 She was d/c'd on 11/23 home with family care and readmitted on 11/25 d/t the listed concern above. 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   PT Comments    Pt was pleasant and motivated to participate during the session and put forth good effort throughout. Pt was upset after discussing pain but was able to be consoled and continue therapy session. Pt was able to complete all ther ex in bed with mild increase in pain. Pt required increased time and effort for bed mobility with supervision. Pt required min guard for safety with STS and ambulation. Pt ambulated 14ft from room to hallway but requested to save energy for stairs in rehab gym. Pt was able to complete safe and proper stair sequencing with min guard for safety and min cuing for hand placement. SpO2 and HR remained WNL throughout the session. Pt will benefit from HHPT upon discharge to safely address deficits listed in patient problem list for decreased caregiver assistance and eventual return to PLOF.   Recommendations for follow up therapy are one component of a multi-disciplinary discharge planning process, led by the attending physician.  Recommendations may be updated based on patient status, additional functional criteria and insurance authorization.  Follow Up Recommendations  Home health PT     Assistance Recommended at Discharge Intermittent Supervision/Assistance  Equipment Recommendations  None recommended by PT    Recommendations for  Other Services       Precautions / Restrictions Precautions Precautions: Fall;Knee Precaution Booklet Issued: Yes (comment)     Mobility  Bed Mobility Overal bed mobility: Modified Independent Bed Mobility: Supine to Sit     Supine to sit: Supervision     General bed mobility comments: Increased time and effort, supervision for bed mobility    Transfers Overall transfer level: Needs assistance Equipment used: Rolling walker (2 wheels) Transfers: Sit to/from Stand Sit to Stand: Min guard           General transfer comment: Increased time and effort, min guard for safety    Ambulation/Gait Ambulation/Gait assistance: Min guard   Assistive device: Rolling walker (2 wheels) Gait Pattern/deviations: Step-to pattern;Decreased step length - right;Decreased step length - left;Decreased stride length Gait velocity: Decreased     General Gait Details: Min guard for safety, pt requested ride to rehab gym to save energy for stairs   Stairs   Stairs assistance: Min guard Stair Management: One rail Right;Step to pattern;Sideways   General stair comments: Pt was able to complete proper and safe stair sequence with min guard for safety and min cuing for hand placement   Wheelchair Mobility    Modified Rankin (Stroke Patients Only)       Balance Overall balance assessment: Needs assistance Sitting-balance support: Bilateral upper extremity supported;Feet supported Sitting balance-Leahy Scale: Good     Standing balance support: Bilateral upper extremity supported;During functional activity Standing balance-Leahy Scale: Fair  Cognition Arousal/Alertness: Awake/alert Behavior During Therapy: WFL for tasks assessed/performed Overall Cognitive Status: Within Functional Limits for tasks assessed                                          Exercises Total Joint Exercises Ankle Circles/Pumps:  AROM;Strengthening;Both;10 reps;Supine Quad Sets: AROM;Strengthening;10 reps;Supine;Right Gluteal Sets: AROM;Strengthening;Both;10 reps;Supine Knee Flexion: AROM;Strengthening;Right;10 reps;Seated Goniometric ROM: R knee AROM 2-89 Other Exercises Other Exercises: Pt education on optimal R knee positioning while seated or in bed Other Exercises: Pt education on HEP of AP, QS, and GS 10-20x every hour    General Comments        Pertinent Vitals/Pain Pain Location: R knee Pain Descriptors / Indicators: Sore;Aching;Discomfort;Crying    Home Living                          Prior Function            PT Goals (current goals can now be found in the care plan section) Acute Rehab PT Goals Patient Stated Goal: control pain PT Goal Formulation: With patient/family Time For Goal Achievement: 12/06/21 Potential to Achieve Goals: Good Progress towards PT goals: Progressing toward goals    Frequency    BID      PT Plan Current plan remains appropriate    Co-evaluation              AM-PAC PT "6 Clicks" Mobility   Outcome Measure  Help needed turning from your back to your side while in a flat bed without using bedrails?: None Help needed moving from lying on your back to sitting on the side of a flat bed without using bedrails?: A Little Help needed moving to and from a bed to a chair (including a wheelchair)?: A Little Help needed standing up from a chair using your arms (e.g., wheelchair or bedside chair)?: A Little Help needed to walk in hospital room?: A Little Help needed climbing 3-5 steps with a railing? : A Little 6 Click Score: 19    End of Session Equipment Utilized During Treatment: Gait belt Activity Tolerance: Patient tolerated treatment well Patient left: in chair;with call bell/phone within reach;with chair alarm set;with family/visitor present;Other (comment) (Daughter in the room, ice reapplied, towel under RLE) Nurse Communication: Mobility  status PT Visit Diagnosis: Other abnormalities of gait and mobility (R26.89);Muscle weakness (generalized) (M62.81);Pain Pain - Right/Left: Right Pain - part of body: Knee     Time: 6269-4854 PT Time Calculation (min) (ACUTE ONLY): 47 min  Charges:                        Hildred Alamin SPT 11/23/21, 1:54 PM

## 2021-11-23 NOTE — TOC Progression Note (Signed)
Transition of Care [facility]) - Progression Note    Patient Details  Name: Jody Brewer MRN: [id number] Date of Birth: 1951/05/03  Transition of [facility]) CM/SW Contact  Allayne Butcher, RN Phone Number: 11/23/2021, 11:15 AM  Clinical Narrative:    PT initially recommended SNF but now has changed recommendation to home health.  Patient was already referred to [facility] after her surgery.  [place] with [facility] can still accept.  Patient will discharge this afternoon or tomorrow.     Expected Discharge Plan: Home w Home Health Services Barriers to Discharge: Continued Medical Work up  Expected Discharge Plan and Services Expected Discharge Plan: Home w Home Health Services     Post Acute Care Choice: Home Health, Resumption of Svcs/PTA Provider Living arrangements for the past 2 months: Single Family Home                 DME Arranged: N/A DME Agency: NA         HH Agency: [facility] Date HH Agency Contacted: 11/21/21 Time HH Agency Contacted: [phone] Representative spoke with at [facility] Agency: [place]   Social Determinants of Health (SDOH) Interventions    Readmission Risk Interventions No flowsheet data found.

## 2021-11-23 NOTE — Discharge Summary (Signed)
[facility] [place] at [facility]   PATIENT NAME: Jody Brewer    MR#:  [id number]  DATE OF BIRTH:  1951-04-29  DATE OF ADMISSION:  11/20/2021 ADMITTING PHYSICIAN: Rolly Salter, MD  DATE OF DISCHARGE: 11/23/2021  PRIMARY CARE PHYSICIAN: Danella Penton, MD    ADMISSION DIAGNOSIS:  Encephalopathy, toxic [G92.9] Encephalopathy, metabolic [G93.41] Opioid overdose, accidental or unintentional, initial encounter (HCC) [T40.2X1A] Drug overdose, accidental or unintentional, initial encounter [T50.901A]  DISCHARGE DIAGNOSIS:  Principal Problem:   Opioid overdose, accidental or unintentional, initial encounter [facility]) Active Problems:   Essential hypertension   AKI (acute kidney injury) (HCC)   S/P total knee replacement using cement, right   Leukocytosis   Normocytic anemia   Hyperlipidemia   GERD (gastroesophageal reflux disease)   Sleep apnea   Acute respiratory failure with hypoxia (HCC)   Toxic encephalopathy   Dehydration   Visual hallucination   Acute metabolic encephalopathy   Depression   SECONDARY DIAGNOSIS:   Past Medical History:  Diagnosis Date  . Asthma   . COVID-19 11/2019   hospitalized x2  . GERD (gastroesophageal reflux disease)   . Headache   . Hypertension   . Sleep apnea     HOSPITAL COURSE:   Acute toxic metabolic encephalopathy secondary to accidental opioid overdose.  Patient initially required IV Narcan in the emergency room.  Patient's mental status has improved.  Need to be careful with pain medications.  Advised not to take oxycodone.  Can use tramadol as needed and try to convert to Tylenol as quickly as possible. Visual hallucination likely secondary to pain medications this is resolved Recent right knee replacement.  Physical therapy now recommending home with home health.  Careful with pain medications.  Try to convert to Tylenol as quickly as possible.  Follow-up with Dr. Joice Lofts as outpatient.  Patient on low-dose  Eliquis for anticoagulation.  Home health ordered. Acute kidney injury.  Creatinine 2.10 on presentation down to 0.71.  This has resolved. Sleep apnea on CPAP at home Hyperlipidemia unspecified on pravastatin Depression on Effexor GERD on PPI Anemia unspecified.  Hemoglobin drifted down a little with IV fluids.  Hemoglobin 8 upon discharge.  Recommend checking hemoglobin as outpatient.  All recent prior hemoglobins in the eights.  DISCHARGE CONDITIONS:   Satisfactory  CONSULTS OBTAINED:  Treatment Team:  [name], MD  DRUG ALLERGIES:   Allergies  Allergen Reactions  . Atorvastatin Palpitations and Other (See Comments)    Muscle pain and severe twitching  . Meloxicam Rash    DISCHARGE MEDICATIONS:   Allergies as of 11/23/2021       Reactions   Atorvastatin Palpitations, Other (See Comments)   Muscle pain and severe twitching   Meloxicam Rash        Medication List     STOP taking these medications    gabapentin 100 MG capsule Commonly known as: NEURONTIN   olmesartan-hydrochlorothiazide 20-12.5 MG tablet Commonly known as: BENICAR HCT   ondansetron 4 MG tablet Commonly known as: ZOFRAN   oxyCODONE 5 MG immediate release tablet Commonly known as: Oxy IR/ROXICODONE       TAKE these medications    acetaminophen 500 MG tablet Commonly known as: TYLENOL Take 2 tablets (1,000 mg total) by mouth every 6 (six) hours as needed.   albuterol 108 (90 Base) MCG/ACT inhaler Commonly known as: VENTOLIN HFA Inhale 2 puffs into the lungs every 4 (four) hours as needed for wheezing.   apixaban 2.5 MG Tabs  tablet Commonly known as: ELIQUIS Take 1 tablet (2.5 mg total) by mouth 2 (two) times daily.   cetirizine 10 MG tablet Commonly known as: ZYRTEC Take 10 mg by mouth daily as needed for allergies.   cycloSPORINE 0.05 % ophthalmic emulsion Commonly known as: RESTASIS 1 drop 2 (two) times daily.   Dialyvite Vitamin D 5000 125 MCG (5000 UT)  capsule Generic drug: Cholecalciferol Take 5,000 Units by mouth 2 (two) times daily.   estradiol 0.5 MG tablet Commonly known as: ESTRACE Take 0.5 mg by mouth daily.   Magnesium 500 MG Tabs Take 500 mg by mouth every evening.   Multi-Vitamin tablet Take 1 tablet by mouth daily.   nortriptyline 10 MG capsule Commonly known as: PAMELOR Take 20 mg by mouth at bedtime.   pantoprazole 40 MG tablet Commonly known as: PROTONIX Take 40 mg by mouth 2 (two) times daily.   pravastatin 20 MG tablet Commonly known as: PRAVACHOL Take 20 mg by mouth at bedtime.   sodium chloride 0.65 % Soln nasal spray Commonly known as: OCEAN Place 1 spray into both nostrils as needed for congestion.   traMADol 50 MG tablet Commonly known as: ULTRAM Take 1 tablet (50 mg total) by mouth every 6 (six) hours as needed for moderate pain.   venlafaxine XR 150 MG 24 hr capsule Commonly known as: EFFEXOR-XR Take 150 mg by mouth daily with breakfast.   vitamin B-12 500 MCG tablet Commonly known as: CYANOCOBALAMIN Take 500 mcg by mouth daily.   vitamin E 180 MG (400 UNITS) capsule Take 400 Units by mouth 2 (two) times daily.         DISCHARGE INSTRUCTIONS:   Follow-up Dr. Joice Lofts as outpatient Follow-up PMD  If you experience worsening of your admission symptoms, develop shortness of breath, life threatening emergency, suicidal or homicidal thoughts you must seek medical attention immediately by calling 911 or calling your MD immediately  if symptoms less severe.  You Must read complete instructions/literature along with all the possible adverse reactions/side effects for all the Medicines you take and that have been prescribed to you. Take any new Medicines after you have completely understood and accept all the possible adverse reactions/side effects.   Please note  You were cared for by a hospitalist during your hospital stay. If you have any questions about your discharge medications or the  care you received while you were in the hospital after you are discharged, you can call the unit and asked to speak with the hospitalist on call if the hospitalist that took care of you is not available. Once you are discharged, your primary care physician will handle any further medical issues. Please note that NO REFILLS for any discharge medications will be authorized once you are discharged, as it is imperative that you return to your primary care physician (or establish a relationship with a primary care physician if you do not have one) for your aftercare needs so that they can reassess your need for medications and monitor your lab values.    Today   CHIEF COMPLAINT:   Chief Complaint  Patient presents with  . Shortness of Breath    HISTORY OF PRESENT ILLNESS:  Jody Brewer  is a 70 y.o. female with a known history of came in with altered mental status   VITAL SIGNS:  Blood pressure (!) 152/77, pulse 80, temperature 97.7 F (36.5 C), temperature source Oral, resp. rate 16, SpO2 96 %.  I/O:   Intake/Output Summary (Last 24  hours) at 11/23/2021 1647 Last data filed at 11/23/2021 1410 Gross per 24 hour  Intake 606.7 ml  Output --  Net 606.7 ml    PHYSICAL EXAMINATION:  GENERAL:  70 y.o.-year-old patient lying in the bed with no acute distress.  EYES: Pupils equal, round, reactive to light and accommodation. No scleral icterus.  HEENT: Head atraumatic, normocephalic. Oropharynx and nasopharynx clear.   LUNGS: Normal breath sounds bilaterally, no wheezing, rales,rhonchi or crepitation. No use of accessory muscles of respiration.  CARDIOVASCULAR: S1, S2 normal. No murmurs, rubs, or gallops.  ABDOMEN: Soft, non-tender, non-distended.  EXTREMITIES: No pedal edema.  NEUROLOGIC: Cranial nerves II through XII are intact. Muscle strength 5/5 in all extremities. Sensation intact. Gait not checked.  PSYCHIATRIC: The patient is alert and answers all questions appropriately SKIN:  No obvious rash, lesion, or ulcer.   DATA REVIEW:   CBC Recent Labs  Lab 11/22/21 0422 11/23/21 0440  WBC 7.0  --   HGB 8.6* 8.0*  HCT 26.4*  --   PLT 299  --     Chemistries  Recent Labs  Lab 11/21/21 0338 11/22/21 0422  NA 137 138  K 3.6 3.4*  CL 104 104  CO2 28 28  GLUCOSE 94 104*  BUN 31* 16  CREATININE 1.40* 0.71  CALCIUM 8.2* 8.4*  MG 2.3  --   AST 32  --   ALT 17  --   ALKPHOS 62  --   BILITOT 1.1  --     Microbiology Results  Results for orders placed or performed during the hospital encounter of 11/20/21  Resp Panel by RT-PCR (Flu A&B, Covid) Nasopharyngeal Swab     Status: None   Collection Time: 11/20/21  5:54 PM   Specimen: Nasopharyngeal Swab; Nasopharyngeal(NP) swabs in vial transport medium  Result Value Ref Range Status   SARS Coronavirus 2 by RT PCR NEGATIVE NEGATIVE Final    Comment: (NOTE) SARS-CoV-2 target nucleic acids are NOT DETECTED.  The SARS-CoV-2 RNA is generally detectable in upper respiratory specimens during the acute phase of infection. The lowest concentration of SARS-CoV-2 viral copies this assay can detect is 138 copies/mL. A negative result does not preclude SARS-Cov-2 infection and should not be used as the sole basis for treatment or other patient management decisions. A negative result may occur with  improper specimen collection/handling, submission of specimen other than nasopharyngeal swab, presence of viral mutation(s) within the areas targeted by this assay, and inadequate number of viral copies(<138 copies/mL). A negative result must be combined with clinical observations, patient history, and epidemiological information. The expected result is Negative.  Fact Sheet for Patients:  BloggerCourse.com  Fact Sheet for Healthcare Providers:  SeriousBroker.it  This test is no t yet approved or cleared by the [place] FDA and  has been authorized for detection  and/or diagnosis of SARS-CoV-2 by FDA under an Emergency Use Authorization (EUA). This EUA will remain  in effect (meaning this test can be used) for the duration of the COVID-19 declaration under Section 564(b)(1) of the Act, 21 U.S.C.section 360bbb-3(b)(1), unless the authorization is terminated  or revoked sooner.       Influenza A by PCR NEGATIVE NEGATIVE Final   Influenza B by PCR NEGATIVE NEGATIVE Final    Comment: (NOTE) The Xpert Xpress SARS-CoV-2/FLU/RSV plus assay is intended as an aid in the diagnosis of influenza from Nasopharyngeal swab specimens and should not be used as a sole basis for treatment. Nasal washings and aspirates are unacceptable for  Xpert Xpress SARS-CoV-2/FLU/RSV testing.  Fact Sheet for Patients: BloggerCourse.com  Fact Sheet for Healthcare Providers: SeriousBroker.it  This test is not yet approved or cleared by the [place] FDA and has been authorized for detection and/or diagnosis of SARS-CoV-2 by FDA under an Emergency Use Authorization (EUA). This EUA will remain in effect (meaning this test can be used) for the duration of the COVID-19 declaration under Section 564(b)(1) of the Act, 21 U.S.C. section 360bbb-3(b)(1), unless the authorization is terminated or revoked.  Performed at [facility], [address] [place], [place] [address]       Management plans discussed with the patient, family and they are in agreement.  CODE STATUS:     Code Status Orders  (From admission, onward)           Start     Ordered   11/20/21 2016  Full code  Continuous        11/20/21 2015           Code Status History     Date Active Date Inactive Code Status Order ID Comments User Context   11/17/2021 1141 11/18/2021 2046 Full Code [id number]  [name], MD Inpatient   01/15/2020 2325 01/17/2020 2026 Full Code [id number]  Eduard Clos, MD ED   01/10/2020 0117  01/11/2020 2227 Full Code [id number]  Mansy, Vernetta Honey, MD ED   12/27/2019 1614 12/28/2019 1803 Full Code [id number]  Danford, Earl Lites, MD ED      Advance Directive Documentation    Flowsheet Row Most Recent Value  Type of Advance Directive Healthcare Power of [place], Living will  [daughter Maralyn Sago is HCPOA]  Pre-existing out of facility DNR order (yellow form or pink MOST form) --  "MOST" Form in Place? --       TOTAL TIME TAKING CARE OF THIS PATIENT: 35 minutes.    Alford Highland M.D on 11/23/2021 at 4:47 PM    [facility]  CC: Primary care physician; Danella Penton, MD

## 2021-11-23 NOTE — Progress Notes (Signed)
Pain uncontrolled this shift with prn medications. Pt states "tylenol doesn't work" and relief from tramadol doesn't last very long. C/o nausea this morning, notified Mansy, MD for zofran order. Ambulating up to bathroom with family assist and walker without issue.

## 2021-11-23 NOTE — Progress Notes (Signed)
Patient ID: Jody Brewer, female   DOB: Nov 02, 1951, 70 y.o.   MRN: [id number]  Subjective: The patient appears to be much clearer and more herself this morning.  She notes mild-moderate discomfort in her knee, but has no new complaints regarding her knee.   Objective: Vital signs in last 24 hours: Temp:  [97.6 F (36.4 C)-98.3 F (36.8 C)] 97.7 F (36.5 C) (11/28 1623) Pulse Rate:  [74-89] 80 (11/28 1623) Resp:  [16-18] 16 (11/28 1623) BP: (132-152)/(59-78) 152/77 (11/28 1623) SpO2:  [96 %-100 %] 96 % (11/28 1623)  Intake/Output from previous day: 11/27 0701 - 11/28 0700 In: 2431.7 [P.O.:240; I.V.:2191.7] Out: -  Intake/Output this shift: Total I/O In: 120 [P.O.:120] Out: -   Recent Labs    11/21/21 0338 11/22/21 0422 11/23/21 0440  HGB 8.2* 8.6* 8.0*   Recent Labs    11/21/21 0338 11/22/21 0422  WBC 8.1 7.0  RBC 2.93* 3.03*  HCT 26.1* 26.4*  PLT 261 299   Recent Labs    11/21/21 0338 11/22/21 0422  NA 137 138  K 3.6 3.4*  CL 104 104  CO2 28 28  BUN 31* 16  CREATININE 1.40* 0.71  GLUCOSE 94 104*  CALCIUM 8.2* 8.4*   No results for input(s): LABPT, INR in the last 72 hours.  Physical Exam: The patient's physical examination findings remain unchanged as compared to yesterday.  The wound again appears to be healing well and is without evidence for infection.  She is neurovascularly intact to the right lower extremity and foot.  Assessment: Mental status changes secondary to opioid accidental overdose status post right total knee arthroplasty, improving symptomatically.  Plan: The treatment plans are reviewed with the patient and the patient's daughter who is at the bedside.  The patient's pain will continue to be managed with the least narcotic pain medication that she needs in order to allow her sensorium to clear completely.  She may continue with physical therapy, weightbearing as tolerated on the right leg, working on range of motion and strength  exercises as well as gait and transfer training.   Jody Flake, MD 11/23/2021, 5:59 PM

## 2021-11-23 NOTE — Progress Notes (Signed)
Physical Therapy Treatment Patient Details Name: Jody Brewer MRN: [id number] DOB: 03-Jul-1951 Today's Date: 11/23/2021   History of Present Illness Pt is a 70 y.o. female presenting with accidental opioid overdose d/t misunderstading of instruction. She was admitted with visual hallucinations, toxic encephalopathy, and dehydration. 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 She was d/c'd on 11/23 home with family care and readmitted on 11/25 d/t the listed concern above. 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   PT Comments    Pt was pleasant and motivated to participate during the session and put forth good effort throughout. Pt was supervision for bed mobility and min guard for safety with STS and ambulation. Pt demonstrated a slow but consistent cadence with a step-to pattern that quickly progressed to step-through pattern. Pt demonstrated good understanding for proper stair sequencing with min guard for safety and daughter was present to help practice. Pt will benefit from HHPT upon discharge to safely address deficits listed in patient problem list for decreased caregiver assistance and eventual return to PLOF.   Recommendations for follow up therapy are one component of a multi-disciplinary discharge planning process, led by the attending physician.  Recommendations may be updated based on patient status, additional functional criteria and insurance authorization.  Follow Up Recommendations  Home health PT     Assistance Recommended at Discharge Intermittent Supervision/Assistance  Equipment Recommendations  None recommended by PT    Recommendations for Other Services       Precautions / Restrictions Precautions Precautions: Fall;Knee Precaution Booklet Issued: Yes (comment) Restrictions Weight Bearing Restrictions: Yes RLE Weight Bearing: Weight bearing as  tolerated     Mobility  Bed Mobility Overal bed mobility: Modified Independent Bed Mobility: Supine to Sit     Supine to sit: Supervision Sit to supine: Modified independent (Device/Increase time)   General bed mobility comments: Increased time and effort, supervision for bed mobility    Transfers Overall transfer level: Needs assistance Equipment used: Rolling walker (2 wheels) Transfers: Sit to/from Stand Sit to Stand: Min guard           General transfer comment: Increased time and effort, min guard for safety    Ambulation/Gait Ambulation/Gait assistance: Min guard Gait Distance (Feet): 75 Feet x1, 225 feet x1 Assistive device: Rolling walker (2 wheels) Gait Pattern/deviations: Step-through pattern;Decreased step length - right;Decreased step length - left;Decreased stride length Gait velocity: Decreased     General Gait Details: Min guard for safety, step to pattern that quickly progressed to step through pattern   Stairs Stairs: Yes Stairs assistance: Min guard Stair Management: No rails;Step to pattern;Backwards;With walker Number of Stairs: 4 General stair comments: Pt was able to complete proper and safe stair sequence with min guard for safety and min cuing for hand placement using RW   Wheelchair Mobility    Modified Rankin (Stroke Patients Only)       Balance Overall balance assessment: Needs assistance Sitting-balance support: Bilateral upper extremity supported;Feet supported Sitting balance-Leahy Scale: Good     Standing balance support: Bilateral upper extremity supported;During functional activity Standing balance-Leahy Scale: Good                              Cognition Arousal/Alertness: Awake/alert Behavior During Therapy: WFL for tasks assessed/performed Overall Cognitive Status: Within Functional Limits for tasks  assessed                                          Exercises Total Joint  Exercises Ankle Circles/Pumps: AROM;Strengthening;Both;10 reps;Supine Quad Sets: AROM;Strengthening;10 reps;Supine;Right Gluteal Sets: AROM;Strengthening;Both;10 reps;Supine Knee Flexion: AROM;Strengthening;Right;10 reps;Seated Goniometric ROM: R knee AROM 2-89 Other Exercises Other Exercises: Pt and family education stair sequencing using RW Other Exercises: Pt and family education on car transfers    General Comments        Pertinent Vitals/Pain Pain Assessment: 0-10 Pain Score: 7  Pain Location: R knee Pain Descriptors / Indicators: Sore;Aching;Discomfort Pain Intervention(s): Repositioned;Ice applied    Home Living                          Prior Function            PT Goals (current goals can now be found in the care plan section) Acute Rehab PT Goals Patient Stated Goal: control pain PT Goal Formulation: With patient/family Time For Goal Achievement: 12/06/21 Potential to Achieve Goals: Good Progress towards PT goals: Progressing toward goals    Frequency    BID      PT Plan Current plan remains appropriate    Co-evaluation              AM-PAC PT "6 Clicks" Mobility   Outcome Measure  Help needed turning from your back to your side while in a flat bed without using bedrails?: None Help needed moving from lying on your back to sitting on the side of a flat bed without using bedrails?: A Little Help needed moving to and from a bed to a chair (including a wheelchair)?: A Little Help needed standing up from a chair using your arms (e.g., wheelchair or bedside chair)?: A Little Help needed to walk in hospital room?: A Little Help needed climbing 3-5 steps with a railing? : A Little 6 Click Score: 19    End of Session Equipment Utilized During Treatment: Gait belt Activity Tolerance: Patient tolerated treatment well Patient left: in chair;with call bell/phone within reach;with chair alarm set;with family/visitor present;Other (comment)  (Daughter in room, ice reapplied, towel under RLE) Nurse Communication: Mobility status PT Visit Diagnosis: Other abnormalities of gait and mobility (R26.89);Muscle weakness (generalized) (M62.81);Pain Pain - Right/Left: Right Pain - part of body: Knee     Time: 6803-2122 PT Time Calculation (min) (ACUTE ONLY): 29 min  Charges:                        Hildred Alamin SPT 11/23/21, 4:22 PM

## 2021-11-23 NOTE — TOC Transition Note (Signed)
Transition of Care [facility]) - CM/SW Discharge Note   Patient Details  Name: Jody Brewer MRN: [id number] Date of Birth: 1951-05-25  [facility]) CM/SW Contact:  Allayne Butcher, RN Phone Number: 11/23/2021, 3:35 PM   Clinical Narrative:    Patient medically cleared for discharge home with home health today.  [place] with [facility] notified of discharge.  Patient will be going to stay with her daughter.     Final next level of care: Home w Home Health Services Barriers to Discharge: Barriers Resolved   Patient Goals and CMS Choice Patient states their goals for this hospitalization and ongoing recovery are:: Home with home health CMS Medicare.gov Compare Post Acute Care list provided to:: Patient Choice offered to / list presented to : Patient  Discharge Placement                       Discharge Plan and Services     Post Acute Care Choice: Home Health, Resumption of Svcs/PTA Provider          DME Arranged: N/A DME Agency: NA         HH Agency: [facility] Date [facility] Agency Contacted: 11/23/21 Time HH Agency Contacted: 1534 Representative spoke with at [facility] Agency: [place]  Social Determinants of Health (SDOH) Interventions     Readmission Risk Interventions No flowsheet data found.

## 2021-11-29 ENCOUNTER — Emergency Department: Payer: Medicare Other

## 2021-11-29 ENCOUNTER — Emergency Department
Admission: EM | Admit: 2021-11-29 | Discharge: 2021-11-29 | Disposition: A | Discharge status: Home / Self Care | Payer: Medicare Other | Attending: Emergency Medicine | Admitting: Emergency Medicine

## 2021-11-29 ENCOUNTER — Encounter: Payer: Self-pay | Admitting: Emergency Medicine

## 2021-11-29 ENCOUNTER — Other Ambulatory Visit: Payer: Self-pay

## 2021-11-29 DIAGNOSIS — J45909 Unspecified asthma, uncomplicated: Secondary | ICD-10-CM | POA: Diagnosis not present

## 2021-11-29 DIAGNOSIS — I10 Essential (primary) hypertension: Secondary | ICD-10-CM | POA: Insufficient documentation

## 2021-11-29 DIAGNOSIS — R319 Hematuria, unspecified: Secondary | ICD-10-CM | POA: Diagnosis not present

## 2021-11-29 DIAGNOSIS — K449 Diaphragmatic hernia without obstruction or gangrene: Secondary | ICD-10-CM | POA: Diagnosis not present

## 2021-11-29 DIAGNOSIS — J189 Pneumonia, unspecified organism: Secondary | ICD-10-CM | POA: Insufficient documentation

## 2021-11-29 DIAGNOSIS — Z7901 Long term (current) use of anticoagulants: Secondary | ICD-10-CM | POA: Diagnosis not present

## 2021-11-29 DIAGNOSIS — J4520 Mild intermittent asthma, uncomplicated: Secondary | ICD-10-CM | POA: Diagnosis not present

## 2021-11-29 DIAGNOSIS — Z8616 Personal history of COVID-19: Secondary | ICD-10-CM | POA: Insufficient documentation

## 2021-11-29 DIAGNOSIS — Z20822 Contact with and (suspected) exposure to covid-19: Secondary | ICD-10-CM | POA: Insufficient documentation

## 2021-11-29 DIAGNOSIS — R0609 Other forms of dyspnea: Secondary | ICD-10-CM

## 2021-11-29 DIAGNOSIS — E041 Nontoxic single thyroid nodule: Secondary | ICD-10-CM | POA: Diagnosis not present

## 2021-11-29 DIAGNOSIS — Z96651 Presence of right artificial knee joint: Secondary | ICD-10-CM | POA: Diagnosis not present

## 2021-11-29 DIAGNOSIS — R0602 Shortness of breath: Secondary | ICD-10-CM | POA: Diagnosis present

## 2021-11-29 LAB — CBC WITH DIFFERENTIAL/PLATELET
Abs Immature Granulocytes: 0.05 10*3/uL (ref 0.00–0.07)
Basophils Absolute: 0.1 10*3/uL (ref 0.0–0.1)
Basophils Relative: 1 %
Eosinophils Absolute: 0.2 10*3/uL (ref 0.0–0.5)
Eosinophils Relative: 2 %
HCT: 34.4 % — ABNORMAL LOW (ref 36.0–46.0)
Hemoglobin: 11.4 g/dL — ABNORMAL LOW (ref 12.0–15.0)
Immature Granulocytes: 1 %
Lymphocytes Relative: 20 %
Lymphs Abs: 2.2 10*3/uL (ref 0.7–4.0)
MCH: 29 pg (ref 26.0–34.0)
MCHC: 33.1 g/dL (ref 30.0–36.0)
MCV: 87.5 fL (ref 80.0–100.0)
Monocytes Absolute: 1 10*3/uL (ref 0.1–1.0)
Monocytes Relative: 9 %
Neutro Abs: 7.5 10*3/uL (ref 1.7–7.7)
Neutrophils Relative %: 67 %
Platelets: 668 10*3/uL — ABNORMAL HIGH (ref 150–400)
RBC: 3.93 MIL/uL (ref 3.87–5.11)
RDW: 14.3 % (ref 11.5–15.5)
WBC: 11 10*3/uL — ABNORMAL HIGH (ref 4.0–10.5)
nRBC: 0 % (ref 0.0–0.2)

## 2021-11-29 LAB — COMPREHENSIVE METABOLIC PANEL
ALT: 15 U/L (ref 0–44)
AST: 24 U/L (ref 15–41)
Albumin: 4 g/dL (ref 3.5–5.0)
Alkaline Phosphatase: 81 U/L (ref 38–126)
Anion gap: 10 (ref 5–15)
BUN: 23 mg/dL (ref 8–23)
CO2: 24 mmol/L (ref 22–32)
Calcium: 9.9 mg/dL (ref 8.9–10.3)
Chloride: 100 mmol/L (ref 98–111)
Creatinine, Ser: 0.99 mg/dL (ref 0.44–1.00)
GFR, Estimated: >60 mL/min (ref >60)
Glucose, Bld: 113 mg/dL — ABNORMAL HIGH (ref 70–99)
Potassium: 3.3 mmol/L — ABNORMAL LOW (ref 3.5–5.1)
Sodium: 134 mmol/L — ABNORMAL LOW (ref 135–145)
Total Bilirubin: 0.7 mg/dL (ref 0.3–1.2)
Total Protein: 7.8 g/dL (ref 6.5–8.1)

## 2021-11-29 LAB — RESP PANEL BY RT-PCR (FLU A&B, COVID) ARPGX2
Influenza A by PCR: NEGATIVE
Influenza B by PCR: NEGATIVE
SARS Coronavirus 2 by RT PCR: NEGATIVE

## 2021-11-29 LAB — LACTIC ACID, PLASMA: Lactic Acid, Venous: 1.9 mmol/L (ref 0.5–1.9)

## 2021-11-29 LAB — URINALYSIS, COMPLETE (UACMP) WITH MICROSCOPIC
Bilirubin Urine: NEGATIVE
Glucose, UA: NEGATIVE mg/dL
Ketones, ur: NEGATIVE mg/dL
Nitrite: NEGATIVE
Protein, ur: NEGATIVE mg/dL
Specific Gravity, Urine: 1.02 (ref 1.005–1.030)
pH: 8 (ref 5.0–8.0)

## 2021-11-29 LAB — BRAIN NATRIURETIC PEPTIDE: B Natriuretic Peptide: 4.1 pg/mL (ref 0.0–100.0)

## 2021-11-29 LAB — TROPONIN I (HIGH SENSITIVITY): Troponin I (High Sensitivity): 3 ng/L (ref <18)

## 2021-11-29 MED ORDER — LEVOFLOXACIN 750 MG PO TABS: 750.0000 mg | ORAL_TABLET | Freq: Every day | ORAL | 0 refills | Status: AC

## 2021-11-29 MED ORDER — IOHEXOL 350 MG/ML SOLN
75.0000 mL | Freq: Once | INTRAVENOUS | Status: AC | PRN
  Administered 2021-11-29: 05:00:00 75 mL via INTRAVENOUS

## 2021-11-29 MED ORDER — ACETAMINOPHEN 500 MG PO TABS
1000.0000 mg | ORAL_TABLET | Freq: Once | ORAL | Status: AC
  Administered 2021-11-29: 04:00:00 1000 mg via ORAL
  Filled 2021-11-29: qty 2

## 2021-11-29 NOTE — ED Provider Notes (Signed)
[facility] Emergency Department Provider Note  ____________________________________________   Event Date/Time   First MD Initiated Contact with Patient 11/29/21 [phone]     (approximate)  I have reviewed the triage vital signs and the nursing notes.   HISTORY  Chief Complaint Shortness of Breath  Level 5 caveat: History is limited by either acute illness, anxiety, hearing limitations, or a combination of all issues.  HPI Jody Brewer is a 70 y.o. female with medical history as listed below who presents for evaluation of shaking/rigors and some shortness of breath.  History is limited and she is very upset in general over feeling ill.  She has reportedly not been feeling well since a right knee arthroplasty about 2 weeks ago.  She feels cold all over and has been shaking uncontrollably.  Unclear if she is short of breath because she is very upset and anxious or if she has other issues with dyspnea.  She is not able to provide any additional history at this time.    Past Medical History:  Diagnosis Date   Asthma    COVID-19 11/2019   hospitalized x2   GERD (gastroesophageal reflux disease)    Headache    Hypertension    Sleep apnea     Patient Active Problem List   Diagnosis Date Noted   Acute metabolic encephalopathy    Depression    Visual hallucination 11/21/2021   Opioid overdose, accidental or unintentional, initial encounter (HCC) 11/20/2021   Leukocytosis 11/20/2021   Anemia 11/20/2021   Hyperlipidemia 11/20/2021   GERD (gastroesophageal reflux disease) 11/20/2021   Sleep apnea 11/20/2021   Acute respiratory failure with hypoxia (HCC) 11/20/2021   Toxic encephalopathy 11/20/2021   Dehydration 11/20/2021   S/P total knee replacement using cement, right 11/17/2021   Chest pain 01/15/2020   AKI (acute kidney injury) (HCC) 01/15/2020   SOB (shortness of breath)    COVID-19 12/27/2019   Essential hypertension 12/27/2019   Asthma,  intermittent, uncomplicated 12/27/2019   Pneumonia due to COVID-19 virus 12/27/2019    Past Surgical History:  Procedure Laterality Date   ABDOMINAL HYSTERECTOMY     APPENDECTOMY     CATARACT EXTRACTION W/PHACO Left 06/10/2021   Procedure: CATARACT EXTRACTION PHACO AND INTRAOCULAR LENS PLACEMENT (IOC) LEFT;  Surgeon: Lockie Mola, MD;  Location: [facility];  Service: Ophthalmology;  Laterality: Left;  5.69 0:53.6 10.6%   CATARACT EXTRACTION W/PHACO Right 06/24/2021   Procedure: CATARACT EXTRACTION PHACO AND INTRAOCULAR LENS PLACEMENT (IOC) RIGHT;  Surgeon: Lockie Mola, MD;  Location: [facility];  Service: Ophthalmology;  Laterality: Right;  3.61 00:52.4   CHOLECYSTECTOMY     ELBOW BURSA SURGERY Right    FRENULECTOMY, LINGUAL     and removal of portion of soft palate   LUMBAR DISC SURGERY  1996   L3-L5   LUMBAR DISC SURGERY  2004   LUMBAR FUSION  2006   bone harvest rods (2) and screws (36)   LUMBAR LAMINECTOMY  1998   L3-L5   OOPHORECTOMY     1 ovary removed   TOTAL KNEE ARTHROPLASTY Right 11/17/2021   Procedure: TOTAL KNEE ARTHROPLASTY;  Surgeon: [name], MD;  Location: [facility];  Service: Orthopedics;  Laterality: Right;   UVULOPALATOPHARYNGOPLASTY  2001    Prior to Admission medications   Medication Sig Start Date End Date Taking? Authorizing Provider  levofloxacin (LEVAQUIN) 750 MG tablet Take 1 tablet (750 mg total) by mouth daily for 7 days. 11/29/21 12/06/21 Yes  Loleta Rose, MD  acetaminophen (TYLENOL) 500 MG tablet Take 2 tablets (1,000 mg total) by mouth every 6 (six) hours as needed. 11/18/21 11/18/22  Anson Oregon, PA-C  albuterol (VENTOLIN HFA) 108 (90 Base) MCG/ACT inhaler Inhale 2 puffs into the lungs every 4 (four) hours as needed for wheezing.    [provider]  apixaban (ELIQUIS) 2.5 MG TABS tablet Take 1 tablet (2.5 mg total) by mouth 2 (two) times daily. 11/18/21   Anson Oregon, PA-C   cetirizine (ZYRTEC) 10 MG tablet Take 10 mg by mouth daily as needed for allergies.    [provider]  Cholecalciferol (DIALYVITE VITAMIN D 5000) 125 MCG (5000 UT) capsule Take 5,000 Units by mouth 2 (two) times daily.    [provider]  cycloSPORINE (RESTASIS) 0.05 % ophthalmic emulsion 1 drop 2 (two) times daily.    [provider]  estradiol (ESTRACE) 0.5 MG tablet Take 0.5 mg by mouth daily. 10/15/19   [provider]  Magnesium 500 MG TABS Take 500 mg by mouth every evening.    [provider]  Multiple Vitamin (MULTI-VITAMIN) tablet Take 1 tablet by mouth daily.    [provider]  nortriptyline (PAMELOR) 10 MG capsule Take 20 mg by mouth at bedtime.    [provider]  pantoprazole (PROTONIX) 40 MG tablet Take 40 mg by mouth 2 (two) times daily. 10/15/19   [provider]  pravastatin (PRAVACHOL) 20 MG tablet Take 20 mg by mouth at bedtime.    [provider]  sodium chloride (OCEAN) 0.65 % SOLN nasal spray Place 1 spray into both nostrils as needed for congestion.    [provider]  traMADol (ULTRAM) 50 MG tablet Take 1 tablet (50 mg total) by mouth every 6 (six) hours as needed for moderate pain. 11/18/21   Anson Oregon, PA-C  venlafaxine XR (EFFEXOR-XR) 150 MG 24 hr capsule Take 150 mg by mouth daily with breakfast.    [provider]  vitamin B-12 (CYANOCOBALAMIN) 500 MCG tablet Take 500 mcg by mouth daily.    [provider]  vitamin E 400 UNIT capsule Take 400 Units by mouth 2 (two) times daily.    [provider]    Allergies Atorvastatin and Meloxicam  Family History  Problem Relation Age of Onset   Emphysema Mother    Parkinson's disease Father    Dementia Father    Hypertension Father     Social History Social History   Tobacco Use   Smoking status: Never   Smokeless tobacco: Never  Substance Use Topics   Alcohol use: Not Currently     Alcohol/week: 3.0 standard drinks    Types: 3 Glasses of wine per week   Drug use: Not Currently    Review of Systems Level 5 caveat: History is limited by either acute illness, anxiety, hearing limitations, or a combination of all issues.   ____________________________________________   PHYSICAL EXAM:  ED Triage Vitals  Enc Vitals Group     BP 11/29/21 0205 (!) 154/72     Pulse Rate 11/29/21 0205 98     Resp 11/29/21 0205 (!) 28     Temp 11/29/21 0205 100 F (37.8 C)     Temp Source 11/29/21 0205 Rectal     SpO2 11/29/21 0159 98 %     Weight 11/29/21 0207 77.1 kg (170 lb)     Height 11/29/21 0207 1.702 m (5\' 7" )     Head Circumference --  Peak Flow --      Pain Score 11/29/21 0206 3     Pain Loc --      Pain Edu? --      Excl. in GC? --      Constitutional: Alert and oriented, very anxious, shaking. Eyes: Conjunctivae are normal.  Head: Atraumatic. Nose: No congestion/rhinnorhea. Mouth/Throat: Patient is wearing a mask. Neck: No stridor.  No meningeal signs.   Cardiovascular: Borderline tachycardia, regular rhythm. Good peripheral circulation. Respiratory: Normal respiratory effort but increased rate.  No retractions.  Breath sounds are normal. Gastrointestinal: Soft and nontender. No distention.  Musculoskeletal: The patient's right knee is generally well-appearing.  There is some erythema and warmth around and otherwise well-appearing surgical scar.  No significant pain or tenderness with flexion and extension. Neurologic: Poor hearing at baseline according to the patient, otherwise no gross neurological deficits. Skin:  Skin is warm, dry and intact.  Erythema around right knee surgical wound as described above without fluctuance nor induration. Psychiatric: Mood and affect are very anxious and upset,   ____________________________________________   LABS (all labs ordered are listed, but only abnormal results are displayed)  Labs Reviewed  COMPREHENSIVE  METABOLIC PANEL - Abnormal; Notable for the following components:      Result Value   Sodium 134 (*)    Potassium 3.3 (*)    Glucose, Bld 113 (*)    All other components within normal limits  CBC WITH DIFFERENTIAL/PLATELET - Abnormal; Notable for the following components:   WBC 11.0 (*)    Hemoglobin 11.4 (*)    HCT 34.4 (*)    Platelets 668 (*)    All other components within normal limits  URINALYSIS, COMPLETE (UACMP) WITH MICROSCOPIC - Abnormal; Notable for the following components:   APPearance CLEAR (*)    Hgb urine dipstick LARGE (*)    Leukocytes,Ua SMALL (*)    Bacteria, UA FEW (*)    All other components within normal limits  CULTURE, BLOOD (ROUTINE X 2)  CULTURE, BLOOD (ROUTINE X 2)  RESP PANEL BY RT-PCR (FLU A&B, COVID) ARPGX2  URINE CULTURE  LACTIC ACID, PLASMA  BRAIN NATRIURETIC PEPTIDE  TROPONIN I (HIGH SENSITIVITY)   ____________________________________________  EKG  ED ECG REPORT I, Loleta Rose, the attending physician, personally viewed and interpreted this ECG.  Date: 11/29/2021 EKG Time: 2:09 AM Rate: 94 Rhythm: normal sinus rhythm QRS Axis: normal Intervals: normal ST/T Wave abnormalities: normal Narrative Interpretation: no evidence of acute ischemia  ____________________________________________  RADIOLOGY I, Loleta Rose, personally viewed and evaluated these images (plain radiographs) as part of my medical decision making, as well as reviewing the written report by the radiologist.  ED MD interpretation: No acute abnormality on chest x-ray.  CTA shows a thyroid nodule that is new and some increasing groundglass opacity which could represent active pneumonitis  Official radiology report(s): CT Angio Chest PE W and/or Wo Contrast  Result Date: 11/29/2021 CLINICAL DATA:  Shortness of breath, suspicion of pulmonary embolism. EXAM: CT ANGIOGRAPHY CHEST WITH CONTRAST TECHNIQUE: Multidetector CT imaging of the chest was performed using the standard  protocol during bolus administration of intravenous contrast. Multiplanar CT image reconstructions and MIPs were obtained to evaluate the vascular anatomy. CONTRAST:  78mL OMNIPAQUE IOHEXOL 350 MG/ML SOLN COMPARISON:  CTA chest 01/15/2020. FINDINGS: Cardiovascular: The cardiac size is normal. There are trace calcifications in left-sided coronary arteries. No pericardial effusion. There is no aortic aneurysm or dissection, only trace calcific plaques in the arch. There is variant branching anatomy  of the aortic arch with brachiobicarotid trunk. The great vessels are clear. The pulmonary arteries are normal caliber without evidence of thromboemboli. Mediastinum/Nodes: There is a 1.5 cm nodule in the right lobe of the thyroid gland which was not seen previously. There is no intrathoracic or axillary adenopathy. There is a small hiatal hernia with the esophagus unremarkable. The trachea is patent without filling defect. Lungs/Pleura: Mild-to-moderate elevation again noted right hemidiaphragm and may suggest paresis or eventration, unchanged. There is increasing ground-glass opacity medially in the superior segment of the right lower lobe and increasing infrahilar ground-glass opacity in the medial basal segment of the right lower lobe, findings most likely indicating active pneumonitis including viral etiologies. Other peripheral ground-glass opacities of the lung fields noted on the prior study have cleared in the interval. There are subpleural linear scar-like opacities in the right lower lobe base. Old linear scar-like opacities in the right middle lobe anterior base. Rest of the lungs and the central airways are clear. Upper Abdomen: Some images may suggest nodular changes to the anterior surface of the left lobe of liver which may be seen with early cirrhosis. There is no splenomegaly, ascites or portal vein dilatation. Old cholecystectomy. Musculoskeletal: Multilevel posterior spinal fusion hardware is again noted  beginning in the lower thoracic spine. There are degenerative changes of the upper to midthoracic spine. Review of the MIP images confirms the above findings. IMPRESSION: 1. No acute CTA findings. 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 2. Increased ground-glass opacities in the right lower lobe concerning for active pneumonitis including viral etiologies. Follow-up study recommended after treatment to ensure clearing. 3. New finding of a 1.5 cm low-density nodule in the right lobe of the thyroid gland. Routine ultrasound follow-up recommended. 4. Question raised of early changes of cirrhosis in the left lobe of the liver. 5. Small hiatal hernia. Electronically Signed   By: Almira Bar M.D.   On: 11/29/2021 05:35   DG Chest Port 1 View  Result Date: 11/29/2021 CLINICAL DATA:  Possible sepsis EXAM: PORTABLE CHEST 1 VIEW COMPARISON:  11/20/2021 FINDINGS: Cardiac shadow is within normal limits. Lungs are well aerated bilaterally. No focal infiltrate or effusion is seen. Postsurgical changes in the thoracolumbar spine. IMPRESSION: No acute abnormality noted. Electronically Signed   By: Alcide Clever M.D.   On: 11/29/2021 02:17   DG Knee Complete 4 Views Right  Result Date: 11/29/2021 CLINICAL DATA:  Persistent fevers following knee replacement 2 weeks ago. EXAM: RIGHT KNEE - COMPLETE 4+ VIEW COMPARISON:  Postoperative right knee series 11/17/2021 FINDINGS: Overlying skin staples are still present. Total joint arthroplasty components are normally seated without loosening. There is at least a small sized suprapatellar bursal effusion , nonspecific. Intra-articular air pockets on the postoperative study are no longer seen. No destructive bone lesion is evident. There is slight soft tissue fullness anteriorly. IMPRESSION: Small suprapatellar bursal effusion and slight anterior soft tissue fullness. No evidence of interval hardware failure or loosening or destructive bone lesions. No  evidence of fractures. Electronically Signed   By: Almira Bar M.D.   On: 11/29/2021 04:19    ____________________________________________   PROCEDURES   Procedure(s) performed (including Critical Care):  .1-3 Lead EKG Interpretation Performed by: Loleta Rose, MD Authorized by: Loleta Rose, MD     Interpretation: normal     ECG rate:  85   ECG rate assessment: normal     Rhythm: sinus rhythm     Ectopy: none     Conduction:  normal     ____________________________________________   INITIAL IMPRESSION / MDM / ASSESSMENT AND PLAN / ED COURSE  As part of my medical decision making, I reviewed the following data within the electronic MEDICAL RECORD NUMBER History obtained from family, Nursing notes reviewed and incorporated, Labs reviewed , EKG interpreted , Old chart reviewed, Radiograph reviewed , and Notes from prior ED visits   Differential diagnosis includes, but is not limited to, pneumonia, urinary tract infection, viral illness, bacteremia, postoperative infection of the right knee.  The patient's knee appears well and healthy and I strongly doubt that the symptoms are the result of a postoperative infection.  She has no sign of a septic joint and no sign of surrounding cellulitis.  I personally reviewed the patient's imaging and agree with the radiologist's interpretation that there is no obvious sign of abnormality on the knee x-rays.  Chest x-ray is pending.  Patient is having profound rigors but I think that there is also a strong anxiety component and the patient agrees that this is the case as well.  Lab work is pending, daughter will arrive soon.  I will perform a "possible sepsis" work-up for further evaluation.  The patient is on the cardiac monitor to evaluate for evidence of arrhythmia and/or significant heart rate changes.     Clinical Course as of 11/29/21 9811  Wynelle Link Nov 29, 2021  0242 DG Chest [place] 1 View I personally reviewed the patient's imaging and  agree with the radiologist's interpretation that there are no acute abnormalities on chest x-ray including no evidence of pneumonia. [CF]  0255 Lactic Acid, Venous: 1.9 [CF]  0255 CMP is essentially normal and CBC is essentially normal other than a very mild leukocytosis of 11. [CF]  0345 Resp Panel by RT-PCR (Flu A&B, Covid) Nasopharyngeal Swab Negative viral panel [CF]  0441 The patient's daughter is now at bedside and I was able to get a much better history.  She has been doing very well at home after her last hospitalization for acute encephalopathy that seem to be secondary to accidentally taking too many opioids for the postoperative pain.  She has been getting PT at home and was able to walk up 2 flights of stairs day before yesterday without difficulty.  However starting yesterday, she felt extremely out of breath with any small amount of exertion.  Over the course of the day the exertional dyspnea continued but then she also developed the rigors and subjective fever that she is still experiencing.  She said it was an acute and severe change from what she felt the day before.  I ask about the Eliquis and this was started postoperatively to avoid DVT development.  She has been compliant with the medication.  However, given the acute onset of exertional dyspnea and no clear sign or symptom to point to another specific diagnosis, I talked with the patient and her daughter and we agreed to perform a CTA chest to rule out PE.  She has no signs or symptoms of DVT but I feel that this is worth doing, and will also allow [place] to look in more detail at her lung parenchyma to make sure there is not infection that cannot be seen on chest x-ray.  We talked about the possibility of bacteremia and UTI based on her urinalysis results, although she says she was having no urinary symptoms and the blood may be from the traumatic in and out catheterization attempt that occurred before she was able to urinate  on her  own.  I will reassess after the CT results are back. [CF]  0448 Urinalysis, Complete w Microscopic(!) As previously mentioned, there is some blood in the urine specimen, but she had a traumatic and unsuccessful in and out catheterization upon arrival to the emergency department and that could account for the urine even though there does seem to be a relatively high concentration of WBCs.  However the patient states that she was having no urinary symptoms and I do not want to treat empirically unless it is indicated based on a urine culture. [CF]  0537 B Natriuretic Peptide: 4.1 [CF]  0537 Troponin I (High Sensitivity): 3 [CF]  [phone] I reviewed the CT scan report by the radiologist.  There is a question of acute pneumonitis, viral versus bacterial.  I discussed this with the patient and her daughter.  We talked about the risks and benefits of treating empirically with antibiotics but at this point we all agreed we should try and antibiotics particularly given the rigors, leukocytosis, etc.  The patient said she has tolerated Levaquin well in the past and even though I explained to her the current concerns about Levaquin and fluoroquinolones in general, she would prefer to take the Levaquin given its broad coverage and success in the past, rather than taking multiple other antibiotics that might have similar coverage.  I think that is reasonable.  She will follow-up with Dr. Hyacinth Meeker as an outpatient and I gave her and her daughter my usual and customary return precautions and follow-up recommendations. [CF]    Clinical Course User Index [CF] Loleta Rose, MD     ____________________________________________  FINAL CLINICAL IMPRESSION(S) / ED DIAGNOSES  Final diagnoses:  Pneumonitis  Thyroid nodule  Exertional dyspnea     MEDICATIONS GIVEN DURING THIS VISIT:  Medications  acetaminophen (TYLENOL) tablet 1,000 mg (1,000 mg Oral Given 11/29/21 0425)  iohexol (OMNIPAQUE) 350 MG/ML injection 75 mL  (75 mLs Intravenous Contrast Given 11/29/21 0504)     ED Discharge Orders          Ordered    levofloxacin (LEVAQUIN) 750 MG tablet  Daily        11/29/21 0709             Note:  This document was prepared using Dragon voice recognition software and may include unintentional dictation errors.   Loleta Rose, MD 11/29/21 [phone]

## 2021-11-29 NOTE — ED Notes (Signed)
Pt taken to CT at this time.

## 2021-11-29 NOTE — ED Notes (Signed)
Pt returned from CT at this time.  

## 2021-11-29 NOTE — ED Triage Notes (Addendum)
Pt arrives via [facility] visibly shaking, C/O dizziness, shakiness, SHOB.  Per pt knee replacement Nov 22.

## 2021-11-29 NOTE — Discharge Instructions (Addendum)
As we discussed, your evaluation was generally reassuring.  However your CT scan of the chest suggest that she may have a degree of inflammation or infection in your lungs.  Given your subjective fever and chills, elevated white blood cell count (even though you did receive some steroids while in the hospital), and overall presentation, we discussed that and agreed that we would try some antibiotics.  As per your preference based on prior experiences and the broad coverage provided, we will provide a prescription for Levaquin; please complete the full course of treatment.  Follow up with your regular doctor, and also let him know that there was a thyroid nodule on your scan that he may want to investigate as an outpatient.    Return to the emergency department if you develop new or worsening symptoms that concern you.

## 2021-11-29 NOTE — ED Notes (Signed)
Pt unable to sign for discharge d/t no signature pad °

## 2021-11-30 LAB — URINE CULTURE: Culture: NO GROWTH

## 2021-12-04 LAB — CULTURE, BLOOD (ROUTINE X 2)
Culture: NO GROWTH
Culture: NO GROWTH
Special Requests: ADEQUATE

## 2021-12-08 ENCOUNTER — Other Ambulatory Visit: Payer: Self-pay | Admitting: Internal Medicine

## 2021-12-08 DIAGNOSIS — E041 Nontoxic single thyroid nodule: Secondary | ICD-10-CM

## 2022-03-11 ENCOUNTER — Other Ambulatory Visit: Payer: Self-pay | Admitting: Physical Medicine & Rehabilitation

## 2022-03-20 ENCOUNTER — Other Ambulatory Visit: Payer: Self-pay

## 2022-03-20 ENCOUNTER — Ambulatory Visit
Admission: RE | Admit: 2022-03-20 | Discharge: 2022-03-20 | Disposition: A | Discharge status: Home / Self Care | Payer: Medicare Other | Source: Ambulatory Visit | Attending: Physical Medicine & Rehabilitation | Admitting: Physical Medicine & Rehabilitation

## 2022-03-20 DIAGNOSIS — M5442 Lumbago with sciatica, left side: Secondary | ICD-10-CM | POA: Diagnosis present

## 2022-03-20 DIAGNOSIS — G8929 Other chronic pain: Secondary | ICD-10-CM | POA: Insufficient documentation

## 2022-06-21 ENCOUNTER — Ambulatory Visit
Admission: RE | Admit: 2022-06-21 | Discharge: 2022-06-21 | Disposition: A | Discharge status: Home / Self Care | Payer: Medicare Other | Source: Ambulatory Visit | Attending: Otolaryngology | Admitting: Otolaryngology

## 2022-06-21 ENCOUNTER — Ambulatory Visit
Admission: RE | Admit: 2022-06-21 | Discharge: 2022-06-21 | Disposition: A | Discharge status: Home / Self Care | Payer: Medicare Other | Attending: Otolaryngology | Admitting: Otolaryngology

## 2022-06-21 ENCOUNTER — Other Ambulatory Visit: Payer: Self-pay | Admitting: Otolaryngology

## 2022-06-21 DIAGNOSIS — R053 Chronic cough: Secondary | ICD-10-CM

## 2022-08-02 ENCOUNTER — Encounter: Payer: Self-pay | Admitting: Otolaryngology

## 2022-08-04 NOTE — Discharge Instructions (Signed)
[facility] ENDOSCOPIC SINUS SURGERY [facility]  What is Functional Endoscopic Sinus Surgery?  The Surgery involves making the natural openings of the sinuses larger by removing the bony partitions that separate the sinuses from the nasal cavity.  The natural sinus lining is preserved as much as possible to allow the sinuses to resume normal function after the surgery.  In some patients nasal polyps (excessively swollen lining of the sinuses) may be removed to relieve obstruction of the sinus openings.  The surgery is performed through the nose using lighted scopes, which eliminates the need for incisions on the face.  A septoplasty is a different procedure which is sometimes performed with sinus surgery.  It involves straightening the boy partition that separates the two sides of your nose.  A crooked or deviated septum may need repair if is obstructing the sinuses or nasal airflow.  Turbinate reduction is also often performed during sinus surgery.  The turbinates are bony proturberances from the side walls of the nose which swell and can obstruct the nose in patients with sinus and allergy problems.  Their size can be surgically reduced to help relieve nasal obstruction.  What Can Sinus Surgery Do For Me?  Sinus surgery can reduce the frequency of sinus infections requiring antibiotic treatment.  This can provide improvement in nasal congestion, post-nasal drainage, facial pressure and nasal obstruction.  Surgery will NOT prevent you from ever having an infection again, so it usually only for patients who get infections 4 or more times yearly requiring antibiotics, or for infections that do not clear with antibiotics.  It will not cure nasal allergies, so patients with allergies may still require medication to treat their allergies after surgery. Surgery may improve headaches related to sinusitis, however, some people will continue to  require medication to control sinus headaches related to allergies.  Surgery will do nothing for other forms of headache (migraine, tension or cluster).  What Are the Risks of Endoscopic Sinus Surgery?  Current techniques allow surgery to be performed safely with little risk, however, there are rare complications that patients should be aware of.  Because the sinuses are located around the eyes, there is risk of eye injury, including blindness, though again, this would be quite rare. This is usually a result of bleeding behind the eye during surgery, which can effect vision, though there are treatments to protect the vision and prevent permanent injury. More serious complications would include bleeding inside the brain cavity or damage to the brain.This happens when the fluid around the brain leaks out into the sinus cavity.  Again, all of these complications are uncommon, and spinal fluid leaks can be safely managed surgically if they occur.  The most common complication of sinus surgery is bleeding from the nose, which may require packing or cauterization of the nose.  Patients with polyps may experience recurrence of the polyps that would require revision surgery.  Alterations of sense of smell or injury to the tear ducts are also rare complications.   What is the Surgery Like, and what is the Recovery?  The Surgery usually takes a couple of hours to perform, and is usually performed under a general anesthetic (completely asleep).  Patients are usually discharged home after a couple of hours.  Sometimes during surgery it is necessary to pack the nose to control bleeding, and the packing is left in place for 24 - 48 hours, and removed by your surgeon.  If   a septoplasty was performed during the procedure, there is often a splint placed which must be removed after 5-7 days.   Discomfort: Pain is usually mild to moderate, and can be controlled by prescription pain medication or acetaminophen (Tylenol).   Aspirin, Ibuprofen (Advil, Motrin), or Naprosyn (Aleve) should be avoided, as they can cause increased bleeding.  Most patients feel sinus pressure like they have a bad head cold for several days.  Sleeping with your head elevated can help reduce swelling and facial pressure, as can ice packs over the face.  A humidifier may be helpful to keep the mucous and blood from drying in the nose.   Diet: There are no specific diet restrictions, however, you should generally start with clear liquids and a light diet of bland foods because the anesthetic can cause some nausea.  Advance your diet depending on how your stomach feels.  Taking your pain medication with food will often help reduce stomach upset which pain medications can cause.  Nasal Saline Irrigation: It is important to remove blood clots and dried mucous from the nose as it is healing.  This is done by having you irrigate the nose at least 3 - 4 times daily with a salt water solution.  We recommend using NeilMed Sinus Rinse (available at the drug store).  Fill the squeeze bottle with the solution, bend over a sink, and insert the tip of the squeeze bottle into the nose  of an inch.  Point the tip of the squeeze bottle towards the inside corner of the eye on the same side your irrigating.  Squeeze the bottle and gently irrigate the nose.  If you bend forward as you do this, most of the fluid will flow back out of the nose, instead of down your throat.   The solution should be warm, near body temperature, when you irrigate.   Each time you irrigate, you should use a full squeeze bottle.   Note that if you are instructed to use Nasal Steroid Sprays at any time after your surgery, irrigate with saline BEFORE using the steroid spray, so you do not wash it all out of the nose. Another product, Nasal Saline Gel (such as AYR Nasal Saline Gel) can be applied in each nostril 3 - 4 times daily to moisture the nose and reduce scabbing or crusting.  Bleeding:   Bloody drainage from the nose can be expected for several days, and patients are instructed to irrigate their nose frequently with salt water to help remove mucous and blood clots.  The drainage may be dark red or brown, though some fresh blood may be seen intermittently, especially after irrigation.  Do not blow you nose, as bleeding may occur. If you must sneeze, keep your mouth open to allow air to escape through your mouth.  If heavy bleeding occurs: Irrigate the nose with saline to rinse out clots, then spray the nose 3 - 4 times with Afrin Nasal Decongestant Spray.  The spray will constrict the blood vessels to slow bleeding.  Pinch the lower half of your nose shut to apply pressure, and lay down with your head elevated.  Ice packs over the nose may help as well. If bleeding persists despite these measures, you should notify your doctor.  Do not use the Afrin routinely to control nasal congestion after surgery, as it can result in worsening congestion and may affect healing.     Activity: Return to work varies among patients. Most patients will be out   of work at least 5 - 7 days to recover.  Patient may return to work after they are off of narcotic pain medication, and feeling well enough to perform the functions of their job.  Patients must avoid heavy lifting (over 10 pounds) or strenuous physical for 2 weeks after surgery, so your employer may need to assign you to light duty, or keep you out of work longer if light duty is not possible.  NOTE: you should not drive, operate dangerous machinery, do any mentally demanding tasks or make any important legal or financial decisions while on narcotic pain medication and recovering from the general anesthetic.    Call Your Doctor Immediately if You Have Any of the Following: Bleeding that you cannot control with the above measures Loss of vision, double vision, bulging of the eye or black eyes. Fever over 101 degrees Neck stiffness with severe headache,  fever, nausea and change in mental state. You are always encouraged to call anytime with concerns, however, please call with requests for pain medication refills during office hours.  Office Endoscopy: During follow-up visits your doctor will remove any packing or splints that may have been placed and evaluate and clean your sinuses endoscopically.  Topical anesthetic will be used to make this as comfortable as possible, though you may want to take your pain medication prior to the visit.  How often this will need to be done varies from patient to patient.  After complete recovery from the surgery, you may need follow-up endoscopy from time to time, particularly if there is concern of recurrent infection or nasal polyps.  

## 2022-08-12 ENCOUNTER — Encounter
Admission: RE | Disposition: A | Discharge status: Home / Self Care | Payer: Self-pay | Source: Home / Self Care | Attending: Otolaryngology

## 2022-08-12 ENCOUNTER — Other Ambulatory Visit: Payer: Self-pay

## 2022-08-12 ENCOUNTER — Ambulatory Visit: Payer: Medicare Other | Admitting: Anesthesiology

## 2022-08-12 ENCOUNTER — Ambulatory Visit (AMBULATORY_SURGERY_CENTER): Payer: Medicare Other | Admitting: Anesthesiology

## 2022-08-12 ENCOUNTER — Encounter: Payer: Self-pay | Admitting: Otolaryngology

## 2022-08-12 ENCOUNTER — Ambulatory Visit
Admission: RE | Admit: 2022-08-12 | Discharge: 2022-08-12 | Disposition: A | Discharge status: Home / Self Care | Payer: Medicare Other | Attending: Otolaryngology | Admitting: Otolaryngology

## 2022-08-12 DIAGNOSIS — Z8616 Personal history of COVID-19: Secondary | ICD-10-CM | POA: Diagnosis not present

## 2022-08-12 DIAGNOSIS — J449 Chronic obstructive pulmonary disease, unspecified: Secondary | ICD-10-CM | POA: Diagnosis not present

## 2022-08-12 DIAGNOSIS — J320 Chronic maxillary sinusitis: Secondary | ICD-10-CM | POA: Diagnosis present

## 2022-08-12 DIAGNOSIS — K219 Gastro-esophageal reflux disease without esophagitis: Secondary | ICD-10-CM | POA: Insufficient documentation

## 2022-08-12 DIAGNOSIS — J3489 Other specified disorders of nose and nasal sinuses: Secondary | ICD-10-CM | POA: Diagnosis not present

## 2022-08-12 DIAGNOSIS — I10 Essential (primary) hypertension: Secondary | ICD-10-CM | POA: Diagnosis not present

## 2022-08-12 DIAGNOSIS — G4730 Sleep apnea, unspecified: Secondary | ICD-10-CM | POA: Diagnosis not present

## 2022-08-12 HISTORY — PX: MAXILLARY ANTROSTOMY: SHX2003

## 2022-08-12 SURGERY — MAXILLARY ANTROSTOMY
Anesthesia: General | Site: Nose | Laterality: Right

## 2022-08-12 MED ORDER — FENTANYL CITRATE PF 50 MCG/ML IJ SOSY: 25.0000 ug | PREFILLED_SYRINGE | INTRAMUSCULAR | Status: DC | PRN

## 2022-08-12 MED ORDER — PHENYLEPHRINE HCL (PRESSORS) 10 MG/ML IV SOLN
INTRAVENOUS | Status: DC | PRN
  Administered 2022-08-12: 40 ug via INTRAVENOUS

## 2022-08-12 MED ORDER — PROPOFOL 10 MG/ML IV BOLUS
INTRAVENOUS | Status: DC | PRN
  Administered 2022-08-12: 40 mg via INTRAVENOUS
  Administered 2022-08-12: 140 mg via INTRAVENOUS

## 2022-08-12 MED ORDER — PHENYLEPHRINE HCL 0.5 % NA SOLN
NASAL | Status: DC | PRN
  Administered 2022-08-12: 15 mL via TOPICAL

## 2022-08-12 MED ORDER — ONDANSETRON HCL 4 MG/2ML IJ SOLN
INTRAMUSCULAR | Status: DC | PRN
  Administered 2022-08-12: 4 mg via INTRAVENOUS

## 2022-08-12 MED ORDER — DEXTROSE 5 % IV SOLN
2000.0000 mg | Freq: Once | INTRAVENOUS | Status: AC
  Administered 2022-08-12: 2000 mg via INTRAVENOUS

## 2022-08-12 MED ORDER — LACTATED RINGERS IV SOLN: INTRAVENOUS | Status: DC | PRN

## 2022-08-12 MED ORDER — PREDNISONE 10 MG PO TABS: ORAL_TABLET | ORAL | 0 refills | Status: DC

## 2022-08-12 MED ORDER — SUCCINYLCHOLINE CHLORIDE 200 MG/10ML IV SOSY
PREFILLED_SYRINGE | INTRAVENOUS | Status: DC | PRN
  Administered 2022-08-12: 100 mg via INTRAVENOUS

## 2022-08-12 MED ORDER — OXYCODONE HCL 5 MG/5ML PO SOLN: 5.0000 mg | Freq: Once | ORAL | Status: DC | PRN

## 2022-08-12 MED ORDER — DEXAMETHASONE SODIUM PHOSPHATE 4 MG/ML IJ SOLN
INTRAMUSCULAR | Status: DC | PRN
  Administered 2022-08-12: 4 mg via INTRAVENOUS

## 2022-08-12 MED ORDER — HYDROCODONE-ACETAMINOPHEN 5-325 MG PO TABS: 1.0000 | ORAL_TABLET | Freq: Four times a day (QID) | ORAL | 0 refills | Status: AC | PRN

## 2022-08-12 MED ORDER — OXYCODONE HCL 5 MG PO TABS: 5.0000 mg | ORAL_TABLET | Freq: Once | ORAL | Status: DC | PRN

## 2022-08-12 MED ORDER — LIDOCAINE HCL (CARDIAC) PF 100 MG/5ML IV SOSY
PREFILLED_SYRINGE | INTRAVENOUS | Status: DC | PRN
  Administered 2022-08-12: 80 mg via INTRAVENOUS

## 2022-08-12 MED ORDER — OXYMETAZOLINE HCL 0.05 % NA SOLN
2.0000 | NASAL | Status: AC
  Administered 2022-08-12: 2 via NASAL

## 2022-08-12 MED ORDER — ONDANSETRON HCL 4 MG/2ML IJ SOLN: 4.0000 mg | Freq: Once | INTRAMUSCULAR | Status: DC | PRN

## 2022-08-12 MED ORDER — FENTANYL CITRATE (PF) 100 MCG/2ML IJ SOLN
INTRAMUSCULAR | Status: DC | PRN
  Administered 2022-08-12: 50 ug via INTRAVENOUS

## 2022-08-12 MED ORDER — LIDOCAINE-EPINEPHRINE 1 %-1:100000 IJ SOLN
INTRAMUSCULAR | Status: DC | PRN
  Administered 2022-08-12: 1.5 mL

## 2022-08-12 MED ORDER — CEPHALEXIN 500 MG PO CAPS: 500.0000 mg | ORAL_CAPSULE | Freq: Two times a day (BID) | ORAL | 0 refills | Status: DC

## 2022-08-12 SURGICAL SUPPLY — 22 items
CANISTER SUCT 1200ML W/VALVE (MISCELLANEOUS) ×1 IMPLANT
ELECT REM PT RETURN 9FT ADLT (ELECTROSURGICAL) ×1
ELECTRODE REM PT RTRN 9FT ADLT (ELECTROSURGICAL) ×1 IMPLANT
GLOVE SURG GAMMEX PI TX LF 7.5 (GLOVE) ×2 IMPLANT
GOWN STRL REUS W/ TWL LRG LVL3 (GOWN DISPOSABLE) ×1 IMPLANT
GOWN STRL REUS W/TWL LRG LVL3 (GOWN DISPOSABLE) ×1
KIT TURNOVER KIT A (KITS) ×1 IMPLANT
NDL HYPO 27GX1-1/4 (NEEDLE) ×1 IMPLANT
NEEDLE HYPO 27GX1-1/4 (NEEDLE) ×1 IMPLANT
PACK ENT CUSTOM (PACKS) ×1 IMPLANT
PACKING NASAL EPIS 4X2.4 XEROG (MISCELLANEOUS) IMPLANT
PATTIES SURGICAL .5 X3 (DISPOSABLE) ×1 IMPLANT
SPLINT NASAL SEPTAL BLV .50 ST (MISCELLANEOUS) ×1 IMPLANT
STRAP BODY AND KNEE 60X3 (MISCELLANEOUS) ×1 IMPLANT
SUT CHROMIC 3-0 (SUTURE) ×1
SUT CHROMIC 3-0 KS 27XMFL CR (SUTURE) ×1
SUT ETHILON 3-0 KS 30 BLK (SUTURE) ×1 IMPLANT
SUT PLAIN GUT 4-0 (SUTURE) ×1 IMPLANT
SUTURE CHRMC 3-0 KS 27XMFL CR (SUTURE) IMPLANT
SYR 3ML LL SCALE MARK (SYRINGE) ×1 IMPLANT
TOWEL OR 17X26 4PK STRL BLUE (TOWEL DISPOSABLE) ×1 IMPLANT
WATER STERILE IRR 250ML POUR (IV SOLUTION) ×1 IMPLANT

## 2022-08-12 NOTE — H&P (Signed)
H&P has been reviewed and patient reevaluated, no changes necessary. To be downloaded later.  

## 2022-08-12 NOTE — Anesthesia Postprocedure Evaluation (Signed)
Anesthesia Post Note  Patient: Jody Brewer  Procedure(s) Performed: MAXILLARY ANTROSTOMY REVISION (Right: Nose)     Patient location during evaluation: PACU Anesthesia Type: General Level of consciousness: awake and alert Pain management: pain level controlled Vital Signs Assessment: post-procedure vital signs reviewed and stable Respiratory status: spontaneous breathing, nonlabored ventilation, respiratory function stable and patient connected to nasal cannula oxygen Cardiovascular status: blood pressure returned to baseline and stable Postop Assessment: no apparent nausea or vomiting Anesthetic complications: no   No notable events documented.  Corinda Gubler

## 2022-08-12 NOTE — Anesthesia Preprocedure Evaluation (Addendum)
Anesthesia Evaluation  Patient identified by MRN, date of birth, ID band Patient awake    Reviewed: Allergy & Precautions, NPO status , Patient's Chart, lab work & pertinent test results  History of Anesthesia Complications Negative for: history of anesthetic complications  Airway Mallampati: II  TM Distance: >3 FB Neck ROM: full    Dental no notable dental hx. (+) Teeth Intact   Pulmonary asthma (mild) , sleep apnea and Continuous Positive Airway Pressure Ventilation , COPD (mild),  COPD inhaler, Patient abstained from smoking.Not current smoker,    Pulmonary exam normal breath sounds clear to auscultation       Cardiovascular Exercise Tolerance: Good METShypertension, Pt. on medications (-) CAD and (-) Past MI Normal cardiovascular exam(-) dysrhythmias  Rhythm:Regular Rate:Normal  echo: 12/2019: Left ventricular ejection fraction, by visual estimation, is 65 to  70%. The left ventricle has hyperdynamic function. There is no left  ventricular hypertrophy.;  ekg: 12/2019: nsr;   Neuro/Psych  Headaches, PSYCHIATRIC DISORDERS Anxiety Depression    GI/Hepatic Neg liver ROS, GERD  Controlled and Medicated,  Endo/Other  negative endocrine ROSneg diabetes  Renal/GU negative Renal ROS  negative genitourinary   Musculoskeletal   Abdominal   Peds  Hematology negative hematology ROS (+)   Anesthesia Other Findings Past Medical History: No date: Asthma 11/2019: COVID-19     Comment:  hospitalized x2 No date: GERD (gastroesophageal reflux disease) No date: Headache No date: Hypertension No date: Sleep apnea  Reproductive/Obstetrics                             Anesthesia Physical  Anesthesia Plan  ASA: 2  Anesthesia Plan: General   Post-op Pain Management:    Induction: Intravenous  PONV Risk Score and Plan: 4 or greater and Ondansetron and Dexamethasone  Airway Management Planned: Oral  ETT  Additional Equipment: None  Intra-op Plan:   Post-operative Plan: Extubation in OR  Informed Consent: I have reviewed the patients History and Physical, chart, labs and discussed the procedure including the risks, benefits and alternatives for the proposed anesthesia with the patient or authorized representative who has indicated his/her understanding and acceptance.     Dental advisory given  Plan Discussed with: CRNA and Surgeon  Anesthesia Plan Comments: (Discussed risks of anesthesia with patient, including PONV, sore throat, lip/dental/eye damage. Rare risks discussed as well, such as cardiorespiratory and neurological sequelae, and allergic reactions. Discussed the role of CRNA in patient's perioperative care. Patient understands.)       Anesthesia Quick Evaluation

## 2022-08-12 NOTE — Op Note (Signed)
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  There was an accessory ostium was there and a bridge of tissue where the previous antrostomy was done posterior and there was lots of thick mucus that was in the sinus that was suctioned out.  Procedure: The patient was brought to the operating room and placed in the supine position.  She was given general anesthesia by oral endotracheal intubation.  Once patient was asleep her nose were prepped using 4% Xylocaine with phenylephrine for vasoconstriction on cottonoid pledgets.  She was prepped and draped in sterile fashion.  Cottonoid pledgets were removed and the 0 degree scope was then used for visualizing the right nasal cavity.  He could see the retained uncinate process with some tissue at the anterior ethmoid with a small accessory ostium that was still intact.  The antrum was more posterior.  There was trimming of a large portion of the inferior turbinate that was gone there is also trimming of some of the middle turbinate were only a posterior and anterior remnant were left intact.  There is clear mucus coming out of the posterior the maxillary antral window.  Patient was getting recycling from the retained uncinate process and natural ostium was then draining back into the sinus and could not clear.  1% Xylocaine with epi 1: 100,000 was used for infiltration of the uncinate process.  Approximately 1.5 mL was used.  A backbiting forcep was then used to incise the uncinate process and to trim the tissues here.  The entire uncinate process was removed up to the anterior ethmoid area.  Underneath that the maxillary antrum was then widened anteriorly to  include the natural ostium.  The bone was eaten away to create a smooth edge anteriorly so that the sinus could drain appropriately.  Once the anterior antrum was opened widely 30 degree scope was used to visualize the rest of sinus and this was partially open and clear.  This was suctioned of all the clear mucus and small blood that was there.  Cottonoid pledgets were placed along the trimmed edges for vasoconstriction.  I did not do any trimming of the inferior or middle turbinates as they already were so trimmed that there was minimal turbinate tissue.  I left this alone to make sure that she had reasonable function of the remaining turbinate and not remove any further turbinate tissue.  Cottonoid pledgets were removed and there is no significant bleeding.  Again the 30 degree scope was used for visualizing the maxillary sinus and it was open and clear and the antrum was completely opened to the natural ostium.  Xerogel was then placed over the anterior antral wall that had a raw surface from trimming.  This was used to help control bleeding and create healing in this area.  No packing was placed in the nose.  Patient tolerated the procedure well.  She was awakened and taken to recovery room in satisfactory condition.  There were no operative complications.  Dispo:   To PACU to be discharged home  Plan: To follow-up in the office in 1 week for further evaluation.  She will use saline flushes at home to slowly wash where  the gel.  I given her some Norco to use for pain if necessary.  She will be on antibiotics and prednisone postop for 1 week  Cammy Copa  08/12/2022 12:29 PM

## 2022-08-12 NOTE — Anesthesia Procedure Notes (Signed)
Procedure Name: Intubation Date/Time: 08/12/2022 10:19 AM  Performed by: Tristain Daily T, CRNAPre-anesthesia Checklist: Patient identified, Emergency Drugs available, Suction available and Patient being monitored Patient Re-evaluated:Patient Re-evaluated prior to induction Oxygen Delivery Method: Circle system utilized Preoxygenation: Pre-oxygenation with 100% oxygen Induction Type: IV induction Ventilation: Mask ventilation without difficulty Laryngoscope Size: Mac and 4 Grade View: Grade II Tube type: Oral Tube size: 7.0 mm Number of attempts: 1 Airway Equipment and Method: Stylet and Oral airway Placement Confirmation: ETT inserted through vocal cords under direct vision, positive ETCO2 and breath sounds checked- equal and bilateral Secured at: 22 cm Tube secured with: Tape Dental Injury: Teeth and Oropharynx as per pre-operative assessment

## 2022-08-12 NOTE — Transfer of Care (Signed)
Immediate Anesthesia Transfer of Care Note  Patient: Jody Brewer  Procedure(s) Performed: MAXILLARY ANTROSTOMY REVISION (Right: Nose)  Patient Location: PACU  Anesthesia Type: General  Level of Consciousness: awake, alert  and patient cooperative  Airway and Oxygen Therapy: Patient Spontanous Breathing and Patient connected to supplemental oxygen  Post-op Assessment: Post-op Vital signs reviewed, Patient's Cardiovascular Status Stable, Respiratory Function Stable, Patent Airway and No signs of Nausea or vomiting  Post-op Vital Signs: Reviewed and stable  Complications: No notable events documented.

## 2022-08-13 ENCOUNTER — Encounter: Payer: Self-pay | Admitting: Otolaryngology

## 2022-08-16 LAB — SURGICAL PATHOLOGY

## 2022-12-13 DIAGNOSIS — E042 Nontoxic multinodular goiter: Secondary | ICD-10-CM | POA: Diagnosis present

## 2023-01-23 ENCOUNTER — Emergency Department: Payer: Medicare Other

## 2023-01-23 ENCOUNTER — Other Ambulatory Visit: Payer: Self-pay

## 2023-01-23 ENCOUNTER — Emergency Department
Admission: EM | Admit: 2023-01-23 | Discharge: 2023-01-23 | Disposition: A | Discharge status: Home / Self Care | Payer: Medicare Other | Attending: Emergency Medicine | Admitting: Emergency Medicine

## 2023-01-23 DIAGNOSIS — J45909 Unspecified asthma, uncomplicated: Secondary | ICD-10-CM | POA: Diagnosis not present

## 2023-01-23 DIAGNOSIS — I10 Essential (primary) hypertension: Secondary | ICD-10-CM | POA: Insufficient documentation

## 2023-01-23 DIAGNOSIS — E860 Dehydration: Secondary | ICD-10-CM | POA: Insufficient documentation

## 2023-01-23 DIAGNOSIS — U071 COVID-19: Secondary | ICD-10-CM | POA: Diagnosis not present

## 2023-01-23 DIAGNOSIS — R1110 Vomiting, unspecified: Secondary | ICD-10-CM | POA: Diagnosis present

## 2023-01-23 LAB — CBC WITH DIFFERENTIAL/PLATELET
Abs Immature Granulocytes: 0.02 10*3/uL (ref 0.00–0.07)
Basophils Absolute: 0 10*3/uL (ref 0.0–0.1)
Basophils Relative: 0 %
Eosinophils Absolute: 0 10*3/uL (ref 0.0–0.5)
Eosinophils Relative: 0 %
HCT: 39.3 % (ref 36.0–46.0)
Hemoglobin: 13.1 g/dL (ref 12.0–15.0)
Immature Granulocytes: 0 %
Lymphocytes Relative: 20 %
Lymphs Abs: 1.1 10*3/uL (ref 0.7–4.0)
MCH: 30.3 pg (ref 26.0–34.0)
MCHC: 33.3 g/dL (ref 30.0–36.0)
MCV: 90.8 fL (ref 80.0–100.0)
Monocytes Absolute: 0.7 10*3/uL (ref 0.1–1.0)
Monocytes Relative: 11 %
Neutro Abs: 3.9 10*3/uL (ref 1.7–7.7)
Neutrophils Relative %: 69 %
Platelets: 231 10*3/uL (ref 150–400)
RBC: 4.33 MIL/uL (ref 3.87–5.11)
RDW: 12.8 % (ref 11.5–15.5)
WBC: 5.7 10*3/uL (ref 4.0–10.5)
nRBC: 0 % (ref 0.0–0.2)

## 2023-01-23 LAB — COMPREHENSIVE METABOLIC PANEL
ALT: 19 U/L (ref 0–44)
AST: 28 U/L (ref 15–41)
Albumin: 3.7 g/dL (ref 3.5–5.0)
Alkaline Phosphatase: 68 U/L (ref 38–126)
Anion gap: 5 (ref 5–15)
BUN: 13 mg/dL (ref 8–23)
CO2: 24 mmol/L (ref 22–32)
Calcium: 8.4 mg/dL — ABNORMAL LOW (ref 8.9–10.3)
Chloride: 105 mmol/L (ref 98–111)
Creatinine, Ser: 0.9 mg/dL (ref 0.44–1.00)
GFR, Estimated: >60 mL/min (ref >60)
Glucose, Bld: 91 mg/dL (ref 70–99)
Potassium: 3.7 mmol/L (ref 3.5–5.1)
Sodium: 134 mmol/L — ABNORMAL LOW (ref 135–145)
Total Bilirubin: 0.4 mg/dL (ref 0.3–1.2)
Total Protein: 7.1 g/dL (ref 6.5–8.1)

## 2023-01-23 LAB — URINALYSIS, ROUTINE W REFLEX MICROSCOPIC
Bilirubin Urine: NEGATIVE
Glucose, UA: NEGATIVE mg/dL
Hgb urine dipstick: NEGATIVE
Ketones, ur: 5 mg/dL — AB
Leukocytes,Ua: NEGATIVE
Nitrite: NEGATIVE
Protein, ur: NEGATIVE mg/dL
Specific Gravity, Urine: 1.013 (ref 1.005–1.030)
pH: 6 (ref 5.0–8.0)

## 2023-01-23 MED ORDER — ONDANSETRON HCL 4 MG/2ML IJ SOLN
4.0000 mg | Freq: Once | INTRAMUSCULAR | Status: AC
  Administered 2023-01-23: 4 mg via INTRAVENOUS
  Filled 2023-01-23: qty 2

## 2023-01-23 MED ORDER — SODIUM CHLORIDE 0.9 % IV BOLUS
1000.0000 mL | Freq: Once | INTRAVENOUS | Status: AC
  Administered 2023-01-23: 1000 mL via INTRAVENOUS

## 2023-01-23 MED ORDER — ONDANSETRON 4 MG PO TBDP: 4.0000 mg | ORAL_TABLET | Freq: Three times a day (TID) | ORAL | 0 refills | Status: DC | PRN

## 2023-01-23 NOTE — ED Notes (Signed)
Patient verbalized understanding of discharge instructions.  ?

## 2023-01-23 NOTE — Discharge Instructions (Signed)
Follow-up with your regular doctor if not improving in 3 days.  Return for worsening.  Take the Zofran ODT as needed for nausea/vomiting.

## 2023-01-23 NOTE — ED Triage Notes (Signed)
Pt comes with c/o + COVID+ on MOnday. Pt states she has had fever, vomiting and diarrhea since. Pt is on paxlovid and has 1 dose left.

## 2023-01-23 NOTE — ED Provider Notes (Signed)
[facility] Provider Note    Event Date/Time   First MD Initiated Contact with Patient 01/23/23 1155     (approximate)   History   COVID+   HPI  Jody Brewer is a 72 y.o. female with history of asthma, hypertension, GERD and sleep apnea presents emergency department stating that she tested positive for COVID last week and is been on Paxlovid.  Has 1 more day on Paxlovid.  Having a lot of vomiting and diarrhea.  States that her abdomen feels tight and bloated.  Denies any fever or chills at this time.  No shortness of breath.  States she just feels very tired and weak.      Physical Exam   Triage Vital Signs: ED Triage Vitals [01/23/23 1119]  Enc Vitals Group     BP 115/75     Pulse Rate 96     Resp 18     Temp (!) 97.5 F (36.4 C)     Temp src      SpO2 100 %     Weight      Height      Head Circumference      Peak Flow      Pain Score 4     Pain Loc      Pain Edu?      Excl. in [place]?     Most recent vital signs: Vitals:   01/23/23 1119  BP: 115/75  Pulse: 96  Resp: 18  Temp: (!) 97.5 F (36.4 C)  SpO2: 100%     General: Awake, no distress.   CV:  Good peripheral perfusion. regular rate and  rhythm Resp:  Normal effort. Lungs cta Abd:  No distention.  Nontender, bowel sounds normal all 4 quadrants Other:      ED Results / Procedures / Treatments   Labs (all labs ordered are listed, but only abnormal results are displayed) Labs Reviewed  COMPREHENSIVE METABOLIC PANEL - Abnormal; Notable for the following components:      Result Value   Sodium 134 (*)    Calcium 8.4 (*)    All other components within normal limits  URINALYSIS, ROUTINE W REFLEX MICROSCOPIC - Abnormal; Notable for the following components:   Color, Urine YELLOW (*)    APPearance CLEAR (*)    Ketones, ur 5 (*)    All other components within normal limits  CBC WITH DIFFERENTIAL/PLATELET     EKG     RADIOLOGY Chest  x-ray    PROCEDURES:   Procedures   MEDICATIONS ORDERED IN ED: Medications  sodium chloride 0.9 % bolus 1,000 mL (1,000 mLs Intravenous New Bag/Given 01/23/23 1256)  ondansetron (ZOFRAN) injection 4 mg (4 mg Intravenous Given 01/23/23 1256)     IMPRESSION / MDM / ASSESSMENT AND PLAN / ED COURSE  I reviewed the triage vital signs and the nursing notes.                              Differential diagnosis includes, but is not limited to, COVID, Paxlovid side effects, dehydration, gastroenteritis  Patient's presentation is most consistent with acute complicated illness / injury requiring diagnostic workup.   CBC metabolic panel and urinalysis ordered.  Patient was given 1 L normal saline along with Zofran 4 mg IV.  Will do a chest x-ray, will refrain from CT of the abdomen/pelvis at this time.  Would like to see how patient responds  to the fluids and Zofran along with looking at her labs prior to ordering a CT.  Chest x-ray independently reviewed and interpreted by me as being negative for any acute abnormality, patient's labs are reassuring  Patient states she is feeling better after the Zofran and normal saline.  She is able to tolerate crackers without vomiting.  States she is not having abdominal pain just feeling like she is bloated.  She is in agreement with the treatment plan.  She can be discharged home.  Not feel that she needs to be admitted as she is stable feeling better after treatment.  She was discharged in stable condition with prescription for Zofran ODT and strict instructions to follow-up if worsening.      FINAL CLINICAL IMPRESSION(S) / ED DIAGNOSES   Final diagnoses:  COVID  Dehydration     Rx / DC Orders   ED Discharge Orders          Ordered    ondansetron (ZOFRAN-ODT) 4 MG disintegrating tablet  Every 8 hours PRN        01/23/23 1620             Note:  This document was prepared using Dragon voice recognition software and may include  unintentional dictation errors.    Versie Starks, PA-C 01/23/23 1622    Blake Divine, MD 01/25/23 2252

## 2023-03-14 ENCOUNTER — Other Ambulatory Visit: Payer: Self-pay | Admitting: Internal Medicine

## 2023-03-14 DIAGNOSIS — Z1231 Encounter for screening mammogram for malignant neoplasm of breast: Secondary | ICD-10-CM

## 2023-03-16 ENCOUNTER — Ambulatory Visit
Admission: RE | Admit: 2023-03-16 | Discharge: 2023-03-16 | Disposition: A | Discharge status: Home / Self Care | Payer: Medicare Other | Source: Ambulatory Visit | Attending: Internal Medicine | Admitting: Internal Medicine

## 2023-03-16 DIAGNOSIS — Z1231 Encounter for screening mammogram for malignant neoplasm of breast: Secondary | ICD-10-CM

## 2023-05-30 ENCOUNTER — Emergency Department: Payer: Medicare Other

## 2023-05-30 ENCOUNTER — Observation Stay
Admission: EM | Admit: 2023-05-30 | Discharge: 2023-06-01 | Disposition: A | Discharge status: Home / Self Care | Payer: Medicare Other | Attending: Surgery | Admitting: Surgery

## 2023-05-30 ENCOUNTER — Other Ambulatory Visit: Payer: Self-pay

## 2023-05-30 ENCOUNTER — Encounter: Payer: Self-pay | Admitting: *Deleted

## 2023-05-30 DIAGNOSIS — Z96651 Presence of right artificial knee joint: Secondary | ICD-10-CM | POA: Insufficient documentation

## 2023-05-30 DIAGNOSIS — J45909 Unspecified asthma, uncomplicated: Secondary | ICD-10-CM | POA: Diagnosis not present

## 2023-05-30 DIAGNOSIS — I10 Essential (primary) hypertension: Secondary | ICD-10-CM | POA: Diagnosis not present

## 2023-05-30 DIAGNOSIS — R1031 Right lower quadrant pain: Secondary | ICD-10-CM | POA: Diagnosis present

## 2023-05-30 DIAGNOSIS — Z8616 Personal history of COVID-19: Secondary | ICD-10-CM | POA: Diagnosis not present

## 2023-05-30 DIAGNOSIS — Z79899 Other long term (current) drug therapy: Secondary | ICD-10-CM | POA: Diagnosis not present

## 2023-05-30 DIAGNOSIS — K3580 Unspecified acute appendicitis: Principal | ICD-10-CM | POA: Diagnosis present

## 2023-05-30 LAB — URINALYSIS, ROUTINE W REFLEX MICROSCOPIC
Bilirubin Urine: NEGATIVE
Glucose, UA: NEGATIVE mg/dL
Hgb urine dipstick: NEGATIVE
Ketones, ur: NEGATIVE mg/dL
Nitrite: NEGATIVE
Protein, ur: NEGATIVE mg/dL
Specific Gravity, Urine: 1.014 (ref 1.005–1.030)
WBC, UA: >50 WBC/hpf (ref 0–5)
pH: 7 (ref 5.0–8.0)

## 2023-05-30 LAB — CBC
HCT: 38.8 % (ref 36.0–46.0)
Hemoglobin: 13 g/dL (ref 12.0–15.0)
MCH: 31.1 pg (ref 26.0–34.0)
MCHC: 33.5 g/dL (ref 30.0–36.0)
MCV: 92.8 fL (ref 80.0–100.0)
Platelets: 330 10*3/uL (ref 150–400)
RBC: 4.18 MIL/uL (ref 3.87–5.11)
RDW: 12.7 % (ref 11.5–15.5)
WBC: 14.6 10*3/uL — ABNORMAL HIGH (ref 4.0–10.5)
nRBC: 0 % (ref 0.0–0.2)

## 2023-05-30 LAB — COMPREHENSIVE METABOLIC PANEL
ALT: 22 U/L (ref 0–44)
AST: 27 U/L (ref 15–41)
Albumin: 4.6 g/dL (ref 3.5–5.0)
Alkaline Phosphatase: 94 U/L (ref 38–126)
Anion gap: 15 (ref 5–15)
BUN: 17 mg/dL (ref 8–23)
CO2: 24 mmol/L (ref 22–32)
Calcium: 9.6 mg/dL (ref 8.9–10.3)
Chloride: 98 mmol/L (ref 98–111)
Creatinine, Ser: 0.94 mg/dL (ref 0.44–1.00)
GFR, Estimated: >60 mL/min (ref >60)
Glucose, Bld: 101 mg/dL — ABNORMAL HIGH (ref 70–99)
Potassium: 4 mmol/L (ref 3.5–5.1)
Sodium: 137 mmol/L (ref 135–145)
Total Bilirubin: 0.6 mg/dL (ref 0.3–1.2)
Total Protein: 7.6 g/dL (ref 6.5–8.1)

## 2023-05-30 LAB — LIPASE, BLOOD: Lipase: 46 U/L (ref 11–51)

## 2023-05-30 MED ORDER — SODIUM CHLORIDE 0.9 % IV BOLUS
1000.0000 mL | Freq: Once | INTRAVENOUS | Status: AC
  Administered 2023-05-30: 1000 mL via INTRAVENOUS

## 2023-05-30 MED ORDER — ONDANSETRON HCL 4 MG/2ML IJ SOLN
4.0000 mg | Freq: Four times a day (QID) | INTRAMUSCULAR | Status: DC | PRN
  Administered 2023-05-31: 4 mg via INTRAVENOUS

## 2023-05-30 MED ORDER — PIPERACILLIN-TAZOBACTAM 3.375 G IVPB
3.3750 g | Freq: Three times a day (TID) | INTRAVENOUS | Status: DC
  Administered 2023-05-31 – 2023-06-01 (×4): 3.375 g via INTRAVENOUS
  Filled 2023-05-30 (×4): qty 50

## 2023-05-30 MED ORDER — HYDROMORPHONE HCL 1 MG/ML IJ SOLN
0.5000 mg | INTRAMUSCULAR | Status: DC | PRN
  Administered 2023-05-31 (×2): 0.5 mg via INTRAVENOUS
  Filled 2023-05-30 (×2): qty 0.5

## 2023-05-30 MED ORDER — ONDANSETRON 4 MG PO TBDP: 4.0000 mg | ORAL_TABLET | Freq: Four times a day (QID) | ORAL | Status: DC | PRN

## 2023-05-30 MED ORDER — PANTOPRAZOLE SODIUM 40 MG IV SOLR
40.0000 mg | Freq: Every day | INTRAVENOUS | Status: DC
  Administered 2023-05-30 – 2023-05-31 (×2): 40 mg via INTRAVENOUS
  Filled 2023-05-30 (×2): qty 10

## 2023-05-30 MED ORDER — POLYETHYLENE GLYCOL 3350 17 G PO PACK: 17.0000 g | PACK | Freq: Every day | ORAL | Status: DC | PRN

## 2023-05-30 MED ORDER — PIPERACILLIN-TAZOBACTAM 3.375 G IVPB 30 MIN
3.3750 g | Freq: Once | INTRAVENOUS | Status: AC
  Administered 2023-05-30: 3.375 g via INTRAVENOUS
  Filled 2023-05-30: qty 50

## 2023-05-30 MED ORDER — SODIUM CHLORIDE 0.9 % IV SOLN: INTRAVENOUS | Status: DC

## 2023-05-30 MED ORDER — ACETAMINOPHEN 500 MG PO TABS
1000.0000 mg | ORAL_TABLET | Freq: Four times a day (QID) | ORAL | Status: DC
  Administered 2023-05-30 – 2023-06-01 (×4): 1000 mg via ORAL
  Filled 2023-05-30 (×5): qty 2

## 2023-05-30 MED ORDER — ONDANSETRON HCL 4 MG/2ML IJ SOLN
4.0000 mg | Freq: Once | INTRAMUSCULAR | Status: AC
  Administered 2023-05-30: 4 mg via INTRAVENOUS
  Filled 2023-05-30: qty 2

## 2023-05-30 MED ORDER — MORPHINE SULFATE (PF) 4 MG/ML IV SOLN
4.0000 mg | Freq: Once | INTRAVENOUS | Status: AC
  Administered 2023-05-30: 4 mg via INTRAVENOUS
  Filled 2023-05-30: qty 1

## 2023-05-30 MED ORDER — IOHEXOL 300 MG/ML  SOLN
100.0000 mL | Freq: Once | INTRAMUSCULAR | Status: AC | PRN
  Administered 2023-05-30: 100 mL via INTRAVENOUS

## 2023-05-30 NOTE — Progress Notes (Signed)
05/30/23  Patient discussed with Dr. Vicente Males.  Presented with 1 day history of RLQ abdominal pain that started earlier this afternoon.  Reports chills and nausea.  Work up in ED showed WBC of 14.6 with otherwise unremarkable CMP and CT scan showing acute appendicitis, with a dilated appendix with an appendicolith at the base.  I have personally viewed the images.  No evidence of perforation or abscess.  Will admit to surgical team, keep NPO, start IV fluids and IV antibiotics and add her for robotic appendectomy tomorrow afternoon.  Full H&P to follow.  Henrene Dodge, MD

## 2023-05-30 NOTE — ED Triage Notes (Signed)
C/O acute onset abdominal pain today at noon.  C/O nausea.  Unable to vomit. Also c/o distention.

## 2023-05-30 NOTE — ED Provider Notes (Signed)
[facility] Provider Note   Event Date/Time   First MD Initiated Contact with Patient 05/30/23 2100     (approximate) History  Abdominal Pain  HPI Jody Brewer is a 72 y.o. female with a past medical history of asthma, hypertension, and hyperlipidemia who presents complaining of right lower quadrant abdominal pain that began at approximately 1330 today and has been worsening since onset.  Patient also endorses a feeling of fullness and aching in her abdomen that has been as bad as 8/10 in severity, mainly in the right lower quadrant, and nonradiating.  Patient also endorses subjective fever/chills with nausea and inability to vomit. ROS: Patient currently denies any vision changes, tinnitus, difficulty speaking, facial droop, sore throat, chest pain, shortness of breath, vomiting/diarrhea, dysuria, or weakness/numbness/paresthesias in any extremity   Physical Exam  Triage Vital Signs: ED Triage Vitals  Enc Vitals Group     BP 05/30/23 1856 130/85     Pulse Rate 05/30/23 1856 84     Resp 05/30/23 1856 18     Temp 05/30/23 1856 98.8 F (37.1 C)     Temp Source 05/30/23 1856 Oral     SpO2 05/30/23 1856 98 %     Weight 05/30/23 1856 169 lb (76.7 kg)     Height 05/30/23 1856 5\' 5"  (1.651 m)     Head Circumference --      Peak Flow --      Pain Score 05/30/23 1859 5     Pain Loc --      Pain Edu? --      Excl. in GC? --    Most recent vital signs: Vitals:   05/30/23 2039 05/30/23 2149  BP: 119/66 (!) 142/66  Pulse: 86 89  Resp: 18 19  Temp:  (!) 101.3 F (38.5 C)  SpO2: 100% 100%   General: Awake, oriented x4. CV:  Good peripheral perfusion.  Resp:  Normal effort.  Abd:  No distention.  Significant right lower quadrant tenderness to palpation Other:  Elderly overweight Caucasian female laying in bed in mild distress secondary to pain ED Results / Procedures / Treatments  Labs (all labs ordered are listed, but only abnormal results are  displayed) Labs Reviewed  COMPREHENSIVE METABOLIC PANEL - Abnormal; Notable for the following components:      Result Value   Glucose, Bld 101 (*)    All other components within normal limits  CBC - Abnormal; Notable for the following components:   WBC 14.6 (*)    All other components within normal limits  URINALYSIS, ROUTINE W REFLEX MICROSCOPIC - Abnormal; Notable for the following components:   Color, Urine YELLOW (*)    APPearance HAZY (*)    Leukocytes,Ua MODERATE (*)    Bacteria, UA RARE (*)    All other components within normal limits  CULTURE, BLOOD (ROUTINE X 2)  CULTURE, BLOOD (ROUTINE X 2)  LIPASE, BLOOD  HIV ANTIBODY (ROUTINE TESTING W REFLEX)  BASIC METABOLIC PANEL  MAGNESIUM  CBC   RADIOLOGY ED MD interpretation: CT of the abdomen and pelvis with IV contrast interpreted independently by me and shows acute uncomplicated appendicitis -Agree with radiology assessment Official radiology report(s): CT ABDOMEN PELVIS W CONTRAST  Result Date: 05/30/2023 CLINICAL DATA:  Right lower quadrant abdominal pain. EXAM: CT ABDOMEN AND PELVIS WITH CONTRAST TECHNIQUE: Multidetector CT imaging of the abdomen and pelvis was performed using the standard protocol following bolus administration of intravenous contrast. RADIATION DOSE REDUCTION: This exam was performed  according to the departmental dose-optimization program which includes automated exposure control, adjustment of the mA and/or kV according to patient size and/or use of iterative reconstruction technique. CONTRAST:  OMNIPAQUE IOHEXOL 300 MG/ML  SOLN COMPARISON:  CT 12/22/2005 FINDINGS: Lower chest: No acute abnormality. Hepatobiliary: Hepatic steatosis. Cholecystectomy. Prominent common bile duct compatible with reservoir effect post cholecystectomy. Pancreas: Unremarkable. Spleen: Unremarkable. Adrenals/Urinary Tract: Unremarkable adrenal glands. No urinary calculi or hydronephrosis. Unremarkable bladder. Stomach/Bowel:  Normal caliber large and small bowel. Moderate stool in the ascending and transverse colon. Appendicolith at the appendiceal base. The appendix is dilated measuring up to 11 mm in short axis mild periappendiceal stranding and trace fluid (series 5/image 35). Stomach is within normal limits. Small hiatal hernia. Vascular/Lymphatic: Aortic atherosclerosis. No enlarged abdominal or pelvic lymph nodes. Reproductive: Status post hysterectomy. No adnexal masses. Other: No free intraperitoneal air. Musculoskeletal: Extensive thoracolumbar fusion hardware. No acute osseous abnormality IMPRESSION: 1. Acute, uncomplicated appendicitis. 2. Hepatic steatosis. Aortic Atherosclerosis (ICD10-I70.0). Electronically Signed   By: Minerva Fester M.D.   On: 05/30/2023 21:54   PROCEDURES: Critical Care performed: Yes, see critical care procedure note(s) .1-3 Lead EKG Interpretation  Performed by: Merwyn Katos, MD Authorized by: Merwyn Katos, MD     Interpretation: normal     ECG rate:  71   ECG rate assessment: normal     Rhythm: sinus rhythm     Ectopy: none     Conduction: normal   CRITICAL CARE Performed by: Merwyn Katos  Total critical care time: 31 minutes  Critical care time was exclusive of separately billable procedures and treating other patients.  Critical care was necessary to treat or prevent imminent or life-threatening deterioration.  Critical care was time spent personally by me on the following activities: development of treatment plan with patient and/or surrogate as well as nursing, discussions with consultants, evaluation of patient's response to treatment, examination of patient, obtaining history from patient or surrogate, ordering and performing treatments and interventions, ordering and review of laboratory studies, ordering and review of radiographic studies, pulse oximetry and re-evaluation of patient's condition.  MEDICATIONS ORDERED IN ED: Medications  piperacillin-tazobactam  (ZOSYN) IVPB 3.375 g (3.375 g Intravenous New Bag/Given 05/30/23 2218)  acetaminophen (TYLENOL) tablet 1,000 mg (has no administration in time range)  HYDROmorphone (DILAUDID) injection 0.5 mg (has no administration in time range)  polyethylene glycol (MIRALAX / GLYCOLAX) packet 17 g (has no administration in time range)  ondansetron (ZOFRAN-ODT) disintegrating tablet 4 mg (has no administration in time range)    Or  ondansetron (ZOFRAN) injection 4 mg (has no administration in time range)  pantoprazole (PROTONIX) injection 40 mg (has no administration in time range)  0.9 %  sodium chloride infusion (has no administration in time range)  piperacillin-tazobactam (ZOSYN) IVPB 3.375 g (has no administration in time range)  iohexol (OMNIPAQUE) 300 MG/ML solution 100 mL (100 mLs Intravenous Contrast Given 05/30/23 2130)  morphine (PF) 4 MG/ML injection 4 mg (4 mg Intravenous Given 05/30/23 2126)  ondansetron (ZOFRAN) injection 4 mg (4 mg Intravenous Given 05/30/23 2126)  sodium chloride 0.9 % bolus 1,000 mL (1,000 mLs Intravenous New Bag/Given 05/30/23 2216)   IMPRESSION / MDM / ASSESSMENT AND PLAN / ED COURSE  I reviewed the triage vital signs and the nursing notes.                             The patient is on the cardiac monitor  to evaluate for evidence of arrhythmia and/or significant heart rate changes. Patient's presentation is most consistent with acute presentation with potential threat to life or bodily function. 72 year old female presents with RLQ abdominal pain c/f appendicitis.  Presentation not consistent with SBO, cholecystitis, torsion, strangulated hernia, or other emergent cause of abdominal pain. CT of the abdomen and pelvis shows acute uncomplicated appendicitis. Not demonstrating signs of perforation or peritonitis on initial exam or reassessment.  ED Antibiotics: 3.75g Zosyn IV  Rx: Zofran, Norco, Consult: I spoke with Dr. Aleen Campi in general surgery who agrees with plan to take  patient to the OR Disposition: Admit to surgery   FINAL CLINICAL IMPRESSION(S) / ED DIAGNOSES   Final diagnoses:  Acute appendicitis, uncomplicated   Rx / DC Orders   ED Discharge Orders     None      Note:  This document was prepared using Dragon voice recognition software and may include unintentional dictation errors.   Merwyn Katos, MD 05/30/23 [phone]

## 2023-05-30 NOTE — ED Triage Notes (Signed)
Pt has abd pain with distention.  Last bm today.  Pain in rlq area. Pt reports back pain.  No dysuria.  Pt alert.

## 2023-05-31 ENCOUNTER — Encounter: Payer: Self-pay | Admitting: Surgery

## 2023-05-31 ENCOUNTER — Other Ambulatory Visit: Payer: Self-pay

## 2023-05-31 ENCOUNTER — Encounter
Admission: EM | Disposition: A | Discharge status: Home / Self Care | Payer: Self-pay | Source: Home / Self Care | Attending: Emergency Medicine

## 2023-05-31 ENCOUNTER — Inpatient Hospital Stay: Payer: Medicare Other | Admitting: Anesthesiology

## 2023-05-31 DIAGNOSIS — K3580 Unspecified acute appendicitis: Secondary | ICD-10-CM

## 2023-05-31 HISTORY — PX: XI ROBOTIC LAPAROSCOPIC ASSISTED APPENDECTOMY: SHX6877

## 2023-05-31 LAB — MAGNESIUM: Magnesium: 2.1 mg/dL (ref 1.7–2.4)

## 2023-05-31 LAB — CBC
HCT: 33 % — ABNORMAL LOW (ref 36.0–46.0)
Hemoglobin: 10.8 g/dL — ABNORMAL LOW (ref 12.0–15.0)
MCH: 30.6 pg (ref 26.0–34.0)
MCHC: 32.7 g/dL (ref 30.0–36.0)
MCV: 93.5 fL (ref 80.0–100.0)
Platelets: 261 10*3/uL (ref 150–400)
RBC: 3.53 MIL/uL — ABNORMAL LOW (ref 3.87–5.11)
RDW: 12.8 % (ref 11.5–15.5)
WBC: 12.8 10*3/uL — ABNORMAL HIGH (ref 4.0–10.5)
nRBC: 0 % (ref 0.0–0.2)

## 2023-05-31 LAB — BASIC METABOLIC PANEL
Anion gap: 7 (ref 5–15)
BUN: 18 mg/dL (ref 8–23)
CO2: 27 mmol/L (ref 22–32)
Calcium: 8.4 mg/dL — ABNORMAL LOW (ref 8.9–10.3)
Chloride: 106 mmol/L (ref 98–111)
Creatinine, Ser: 1.01 mg/dL — ABNORMAL HIGH (ref 0.44–1.00)
GFR, Estimated: 60 mL/min — ABNORMAL LOW (ref >60)
Glucose, Bld: 115 mg/dL — ABNORMAL HIGH (ref 70–99)
Potassium: 3.5 mmol/L (ref 3.5–5.1)
Sodium: 140 mmol/L (ref 135–145)

## 2023-05-31 LAB — MRSA NEXT GEN BY PCR, NASAL: MRSA by PCR Next Gen: NOT DETECTED

## 2023-05-31 SURGERY — APPENDECTOMY, ROBOT-ASSISTED, LAPAROSCOPIC
Anesthesia: General

## 2023-05-31 MED ORDER — GLYCOPYRROLATE 0.2 MG/ML IJ SOLN
INTRAMUSCULAR | Status: DC | PRN
  Administered 2023-05-31: .2 mg via INTRAVENOUS

## 2023-05-31 MED ORDER — SODIUM CHLORIDE 0.9 % IR SOLN
Status: DC | PRN
  Administered 2023-05-31: 200 mL

## 2023-05-31 MED ORDER — LIDOCAINE HCL (CARDIAC) PF 100 MG/5ML IV SOSY
PREFILLED_SYRINGE | INTRAVENOUS | Status: DC | PRN
  Administered 2023-05-31: 80 mg via INTRAVENOUS

## 2023-05-31 MED ORDER — MIDAZOLAM HCL 2 MG/2ML IJ SOLN
INTRAMUSCULAR | Status: DC | PRN
  Administered 2023-05-31: 1 mg via INTRAVENOUS

## 2023-05-31 MED ORDER — BUPIVACAINE-EPINEPHRINE (PF) 0.5% -1:200000 IJ SOLN
INTRAMUSCULAR | Status: DC | PRN
  Administered 2023-05-31: 30 mL via PERINEURAL

## 2023-05-31 MED ORDER — HYDROMORPHONE HCL 1 MG/ML IJ SOLN
INTRAMUSCULAR | Status: AC
  Filled 2023-05-31: qty 1

## 2023-05-31 MED ORDER — VENLAFAXINE HCL ER 75 MG PO CP24
150.0000 mg | ORAL_CAPSULE | Freq: Every day | ORAL | Status: DC
  Administered 2023-05-31 – 2023-06-01 (×2): 150 mg via ORAL
  Filled 2023-05-31 (×2): qty 2

## 2023-05-31 MED ORDER — PROPOFOL 10 MG/ML IV BOLUS
INTRAVENOUS | Status: DC | PRN
  Administered 2023-05-31: 150 mg via INTRAVENOUS

## 2023-05-31 MED ORDER — 0.9 % SODIUM CHLORIDE (POUR BTL) OPTIME
TOPICAL | Status: DC | PRN
  Administered 2023-05-31: 500 mL

## 2023-05-31 MED ORDER — SODIUM CHLORIDE 0.9 % IV SOLN: INTRAVENOUS | Status: DC

## 2023-05-31 MED ORDER — FENTANYL CITRATE (PF) 100 MCG/2ML IJ SOLN: 25.0000 ug | INTRAMUSCULAR | Status: DC | PRN

## 2023-05-31 MED ORDER — ONDANSETRON HCL 4 MG/2ML IJ SOLN: 4.0000 mg | Freq: Once | INTRAMUSCULAR | Status: DC | PRN

## 2023-05-31 MED ORDER — ROCURONIUM BROMIDE 10 MG/ML (PF) SYRINGE
PREFILLED_SYRINGE | INTRAVENOUS | Status: AC
  Filled 2023-05-31: qty 10

## 2023-05-31 MED ORDER — ALBUTEROL SULFATE (2.5 MG/3ML) 0.083% IN NEBU
2.5000 mg | INHALATION_SOLUTION | RESPIRATORY_TRACT | Status: DC | PRN
  Administered 2023-06-01: 2.5 mg via RESPIRATORY_TRACT
  Filled 2023-05-31: qty 3

## 2023-05-31 MED ORDER — SUCCINYLCHOLINE CHLORIDE 200 MG/10ML IV SOSY
PREFILLED_SYRINGE | INTRAVENOUS | Status: AC
  Filled 2023-05-31: qty 10

## 2023-05-31 MED ORDER — PHENYLEPHRINE 80 MCG/ML (10ML) SYRINGE FOR IV PUSH (FOR BLOOD PRESSURE SUPPORT)
PREFILLED_SYRINGE | INTRAVENOUS | Status: DC | PRN
  Administered 2023-05-31 (×2): 80 ug via INTRAVENOUS
  Administered 2023-05-31: 160 ug via INTRAVENOUS

## 2023-05-31 MED ORDER — NORTRIPTYLINE HCL 10 MG PO CAPS
10.0000 mg | ORAL_CAPSULE | Freq: Every day | ORAL | Status: DC
  Administered 2023-05-31: 10 mg via ORAL
  Filled 2023-05-31: qty 1

## 2023-05-31 MED ORDER — MIDAZOLAM HCL 2 MG/2ML IJ SOLN
INTRAMUSCULAR | Status: AC
  Filled 2023-05-31: qty 2

## 2023-05-31 MED ORDER — LACTATED RINGERS IV SOLN: INTRAVENOUS | Status: DC | PRN

## 2023-05-31 MED ORDER — LIDOCAINE HCL (PF) 2 % IJ SOLN
INTRAMUSCULAR | Status: AC
  Filled 2023-05-31: qty 5

## 2023-05-31 MED ORDER — OXYCODONE HCL 5 MG/5ML PO SOLN: 5.0000 mg | Freq: Once | ORAL | Status: DC | PRN

## 2023-05-31 MED ORDER — ONDANSETRON HCL 4 MG/2ML IJ SOLN
INTRAMUSCULAR | Status: AC
  Filled 2023-05-31: qty 2

## 2023-05-31 MED ORDER — PHENYLEPHRINE HCL (PRESSORS) 10 MG/ML IV SOLN: INTRAVENOUS | Status: DC | PRN

## 2023-05-31 MED ORDER — IRBESARTAN 150 MG PO TABS
150.0000 mg | ORAL_TABLET | Freq: Every day | ORAL | Status: DC
  Administered 2023-06-01: 150 mg via ORAL
  Filled 2023-05-31: qty 1

## 2023-05-31 MED ORDER — GABAPENTIN 300 MG PO CAPS
300.0000 mg | ORAL_CAPSULE | Freq: Every day | ORAL | Status: DC
  Administered 2023-05-31: 300 mg via ORAL
  Filled 2023-05-31: qty 1

## 2023-05-31 MED ORDER — BUPIVACAINE HCL (PF) 0.5 % IJ SOLN
INTRAMUSCULAR | Status: AC
  Filled 2023-05-31: qty 30

## 2023-05-31 MED ORDER — EPHEDRINE SULFATE (PRESSORS) 50 MG/ML IJ SOLN
INTRAMUSCULAR | Status: DC | PRN
  Administered 2023-05-31: 5 mg via INTRAVENOUS
  Administered 2023-05-31: 10 mg via INTRAVENOUS

## 2023-05-31 MED ORDER — OXYCODONE HCL 5 MG PO TABS: 5.0000 mg | ORAL_TABLET | Freq: Once | ORAL | Status: DC | PRN

## 2023-05-31 MED ORDER — FENTANYL CITRATE (PF) 100 MCG/2ML IJ SOLN
INTRAMUSCULAR | Status: DC | PRN
  Administered 2023-05-31: 25 ug via INTRAVENOUS

## 2023-05-31 MED ORDER — EPINEPHRINE PF 1 MG/ML IJ SOLN
INTRAMUSCULAR | Status: AC
  Filled 2023-05-31: qty 1

## 2023-05-31 MED ORDER — BUPIVACAINE LIPOSOME 1.3 % IJ SUSP
INTRAMUSCULAR | Status: AC
  Filled 2023-05-31: qty 10

## 2023-05-31 MED ORDER — ONDANSETRON HCL 4 MG/2ML IJ SOLN
INTRAMUSCULAR | Status: DC | PRN
  Administered 2023-05-31: 4 mg via INTRAVENOUS

## 2023-05-31 MED ORDER — BUPIVACAINE LIPOSOME 1.3 % IJ SUSP
INTRAMUSCULAR | Status: DC | PRN
  Administered 2023-05-31: 10 mL

## 2023-05-31 MED ORDER — DEXAMETHASONE SODIUM PHOSPHATE 10 MG/ML IJ SOLN
INTRAMUSCULAR | Status: DC | PRN
  Administered 2023-05-31: 5 mg via INTRAVENOUS

## 2023-05-31 MED ORDER — HYDROMORPHONE HCL 1 MG/ML IJ SOLN
INTRAMUSCULAR | Status: DC | PRN
  Administered 2023-05-31 (×2): .5 mg via INTRAVENOUS

## 2023-05-31 MED ORDER — CEFAZOLIN SODIUM-DEXTROSE 2-3 GM-%(50ML) IV SOLR
INTRAVENOUS | Status: DC | PRN
  Administered 2023-05-31: 2 g via INTRAVENOUS

## 2023-05-31 MED ORDER — ROCURONIUM BROMIDE 100 MG/10ML IV SOLN
INTRAVENOUS | Status: DC | PRN
  Administered 2023-05-31: 50 mg via INTRAVENOUS

## 2023-05-31 MED ORDER — ESTRADIOL 0.5 MG PO TABS
0.5000 mg | ORAL_TABLET | Freq: Every day | ORAL | Status: DC
  Administered 2023-06-01: 0.5 mg via ORAL
  Filled 2023-05-31 (×2): qty 1

## 2023-05-31 MED ORDER — SUGAMMADEX SODIUM 200 MG/2ML IV SOLN
INTRAVENOUS | Status: DC | PRN
  Administered 2023-05-31: 200 mg via INTRAVENOUS

## 2023-05-31 MED ORDER — MONTELUKAST SODIUM 10 MG PO TABS
10.0000 mg | ORAL_TABLET | Freq: Every day | ORAL | Status: DC
  Administered 2023-05-31 – 2023-06-01 (×2): 10 mg via ORAL
  Filled 2023-05-31 (×2): qty 1

## 2023-05-31 MED ORDER — OXYCODONE HCL 5 MG PO TABS
5.0000 mg | ORAL_TABLET | ORAL | Status: DC | PRN
  Administered 2023-05-31 – 2023-06-01 (×2): 10 mg via ORAL
  Filled 2023-05-31 (×2): qty 2

## 2023-05-31 MED ORDER — SODIUM CHLORIDE 0.9 % IV SOLN: INTRAVENOUS | Status: DC | PRN

## 2023-05-31 MED ORDER — PROPOFOL 10 MG/ML IV BOLUS
INTRAVENOUS | Status: AC
  Filled 2023-05-31: qty 20

## 2023-05-31 MED ORDER — FENTANYL CITRATE (PF) 100 MCG/2ML IJ SOLN
INTRAMUSCULAR | Status: AC
  Filled 2023-05-31: qty 2

## 2023-05-31 SURGICAL SUPPLY — 60 items
BAG PRESSURE INF REUSE 1000 (BAG) IMPLANT
BLADE SURG SZ11 CARB STEEL (BLADE) ×1 IMPLANT
COVER WAND RF STERILE (DRAPES) ×1 IMPLANT
DERMABOND ADVANCED .7 DNX12 (GAUZE/BANDAGES/DRESSINGS) ×1 IMPLANT
DRAPE ARM DVNC X/XI (DISPOSABLE) ×4 IMPLANT
DRAPE COLUMN DVNC XI (DISPOSABLE) ×1 IMPLANT
ELECT REM PT RETURN 9FT ADLT (ELECTROSURGICAL) ×1
ELECTRODE REM PT RTRN 9FT ADLT (ELECTROSURGICAL) ×1 IMPLANT
FORCEPS BPLR FENES DVNC XI (FORCEP) ×1 IMPLANT
FORCEPS BPLR R/ABLATION 8 DVNC (INSTRUMENTS) ×1 IMPLANT
GLOVE SURG SYN 7.0 (GLOVE) ×2 IMPLANT
GLOVE SURG SYN 7.0 PF PI (GLOVE) ×2 IMPLANT
GLOVE SURG SYN 7.5 E (GLOVE) ×2 IMPLANT
GLOVE SURG SYN 7.5 PF PI (GLOVE) ×2 IMPLANT
GOWN STRL REUS W/ TWL LRG LVL3 (GOWN DISPOSABLE) ×3 IMPLANT
GOWN STRL REUS W/TWL LRG LVL3 (GOWN DISPOSABLE) ×3
GRASPER SUT TROCAR 14GX15 (MISCELLANEOUS) IMPLANT
IRRIGATION STRYKERFLOW (MISCELLANEOUS) IMPLANT
IRRIGATOR STRYKERFLOW (MISCELLANEOUS) ×1
IRRIGATOR SUCT 8 DISP DVNC XI (IRRIGATION / IRRIGATOR) IMPLANT
IV NS 1000ML (IV SOLUTION) ×1
IV NS 1000ML BAXH (IV SOLUTION) IMPLANT
KIT PINK PAD W/HEAD ARE REST (MISCELLANEOUS) ×1 IMPLANT
KIT PINK PAD W/HEAD ARM REST (MISCELLANEOUS) ×1 IMPLANT
L-HOOK LAP DISP 36CM (ELECTROSURGICAL) ×1
LABEL OR SOLS (LABEL) IMPLANT
LHOOK LAP DISP 36CM (ELECTROSURGICAL) IMPLANT
MANIFOLD NEPTUNE II (INSTRUMENTS) ×1 IMPLANT
NDL HYPO 22X1.5 SAFETY MO (MISCELLANEOUS) ×1 IMPLANT
NDL INSUFFLATION 14GA 120MM (NEEDLE) ×1 IMPLANT
NEEDLE HYPO 22X1.5 SAFETY MO (MISCELLANEOUS) ×1 IMPLANT
NEEDLE INSUFFLATION 14GA 120MM (NEEDLE) ×1 IMPLANT
NS IRRIG 500ML POUR BTL (IV SOLUTION) IMPLANT
OBTURATOR OPTICAL STND 8 DVNC (TROCAR) ×1
OBTURATOR OPTICALSTD 8 DVNC (TROCAR) ×1 IMPLANT
PACK LAP CHOLECYSTECTOMY (MISCELLANEOUS) ×1 IMPLANT
PENCIL SMOKE EVACUATOR (MISCELLANEOUS) ×1 IMPLANT
RELOAD STAPLE 45 3.5 BLU DVNC (STAPLE) IMPLANT
RELOAD STAPLER 3.5X45 BLU DVNC (STAPLE) ×2 IMPLANT
SEAL UNIV 5-12 XI (MISCELLANEOUS) ×3 IMPLANT
SEALER VESSEL EXT DVNC XI (MISCELLANEOUS) IMPLANT
SET TUBE FILTERED XL AIRSEAL (SET/KITS/TRAYS/PACK) IMPLANT
SET TUBE SMOKE EVAC HIGH FLOW (TUBING) ×1 IMPLANT
SOL ELECTROSURG ANTI STICK (MISCELLANEOUS) ×1
SOLUTION ELECTROSURG ANTI STCK (MISCELLANEOUS) ×1 IMPLANT
STAPLER 45 SUREFORM DVNC (STAPLE) IMPLANT
STAPLER RELOAD 3.5X45 BLU DVNC (STAPLE) ×2
SUT MNCRL 4-0 (SUTURE) ×1
SUT MNCRL 4-0 27XMFL (SUTURE) ×1
SUT MNCRL AB 4-0 PS2 18 (SUTURE) ×1 IMPLANT
SUT VIC AB 3-0 SH 27 (SUTURE) ×1
SUT VIC AB 3-0 SH 27X BRD (SUTURE) ×1 IMPLANT
SUT VICRYL 0 AB UR-6 (SUTURE) ×1 IMPLANT
SUT VICRYL 0 UR6 27IN ABS (SUTURE) ×1 IMPLANT
SUTURE MNCRL 4-0 27XMF (SUTURE) IMPLANT
SYS BAG RETRIEVAL 10MM (BASKET) ×1
SYSTEM BAG RETRIEVAL 10MM (BASKET) ×1 IMPLANT
TRAP FLUID SMOKE EVACUATOR (MISCELLANEOUS) ×1 IMPLANT
TRAY FOLEY MTR SLVR 16FR STAT (SET/KITS/TRAYS/PACK) IMPLANT
WATER STERILE IRR 500ML POUR (IV SOLUTION) ×1 IMPLANT

## 2023-05-31 NOTE — ED Notes (Signed)
Pt assisted to and from room toilet per request. Monitoring re-initiated and call bell within pts reach. Room lights turned out per request.

## 2023-05-31 NOTE — Anesthesia Procedure Notes (Signed)
Procedure Name: Intubation Date/Time: 05/31/2023 3:48 PM  Performed by: Merlene Pulling, CRNAPre-anesthesia Checklist: Patient identified, Patient being monitored, Timeout performed, Emergency Drugs available and Suction available Patient Re-evaluated:Patient Re-evaluated prior to induction Oxygen Delivery Method: Circle system utilized Preoxygenation: Pre-oxygenation with 100% oxygen Induction Type: IV induction Ventilation: Mask ventilation without difficulty Laryngoscope Size: 3 and McGraph Grade View: Grade I Tube type: Oral Tube size: 7.0 mm Number of attempts: 1 Airway Equipment and Method: Stylet Placement Confirmation: ETT inserted through vocal cords under direct vision, positive ETCO2 and breath sounds checked- equal and bilateral Secured at: 20 cm Tube secured with: Tape Dental Injury: Teeth and Oropharynx as per pre-operative assessment

## 2023-05-31 NOTE — Anesthesia Preprocedure Evaluation (Signed)
Anesthesia Evaluation  Patient identified by MRN, date of birth, ID band Patient awake    Reviewed: Allergy & Precautions, NPO status , Patient's Chart, lab work & pertinent test results  History of Anesthesia Complications Negative for: history of anesthetic complications  Airway Mallampati: II  TM Distance: >3 FB Neck ROM: full    Dental no notable dental hx. (+) Teeth Intact   Pulmonary asthma , sleep apnea and Continuous Positive Airway Pressure Ventilation , COPD (mild),  COPD inhaler, Patient abstained from smoking.Not current smoker   Pulmonary exam normal breath sounds clear to auscultation       Cardiovascular Exercise Tolerance: Good METShypertension, Pt. on medications (-) CAD and (-) Past MI Normal cardiovascular exam(-) dysrhythmias  Rhythm:Regular Rate:Normal  echo: 12/2019: Left ventricular ejection fraction, by visual estimation, is 65 to  70%. The left ventricle has hyperdynamic function. There is no left  ventricular hypertrophy.;  ekg: 12/2019: nsr;   Neuro/Psych  Headaches PSYCHIATRIC DISORDERS Anxiety Depression       GI/Hepatic Neg liver ROS,GERD  Medicated,,  Endo/Other  negative endocrine ROSneg diabetes    Renal/GU negative Renal ROS  negative genitourinary   Musculoskeletal   Abdominal  (+)  Abdomen: tender.   Peds  Hematology negative hematology ROS (+)   Anesthesia Other Findings Past Medical History: No date: Asthma 11/2019: COVID-19     Comment:  hospitalized x2 No date: GERD (gastroesophageal reflux disease) No date: Headache No date: Hypertension No date: Sleep apnea  Reproductive/Obstetrics                              Anesthesia Physical Anesthesia Plan  ASA: 2  Anesthesia Plan: General   Post-op Pain Management:    Induction: Intravenous and Rapid sequence  PONV Risk Score and Plan: 4 or greater and Ondansetron, Dexamethasone and  Midazolam  Airway Management Planned: Oral ETT and Video Laryngoscope Planned  Additional Equipment: None  Intra-op Plan:   Post-operative Plan: Extubation in OR  Informed Consent: I have reviewed the patients History and Physical, chart, labs and discussed the procedure including the risks, benefits and alternatives for the proposed anesthesia with the patient or authorized representative who has indicated his/her understanding and acceptance.     Dental advisory given  Plan Discussed with: CRNA and Surgeon  Anesthesia Plan Comments: (Discussed risks of anesthesia with patient, including PONV, sore throat, lip/dental/eye damage. Rare risks discussed as well, such as cardiorespiratory and neurological sequelae, and allergic reactions. Discussed the role of CRNA in patient's perioperative care. Patient understands.)         Anesthesia Quick Evaluation

## 2023-05-31 NOTE — H&P (Signed)
[facility] SURGICAL HISTORY & PHYSICAL (cpt [phone])  HISTORY OF PRESENT ILLNESS (HPI):  72 y.o. female presented to [facility] ED yesterday for abdominal pain. Patient reports the acute onset of right lower abdominal pain around 1300. This was mild at first but continued to progressively worsen throughout the day. She reports subjective fever, chills, nausea, and distension. No CP, cough, SOB, urinary changes, emesis, or bowel changes. No history of similar. Previous abdominal surgeries positive for abdominal hysterectomy, oophorectomy, cholecystectomy. Work up in the ED revealed a leukocytosis to 14.6K, Hgb to 13.0, CMP is reassuring. CT Abdomen/Pelvis with acute appendicitis. She is currently on Zosyn.   General surgery is consulted by emergency medicine physician Dr Donna Bernard, MD for evaluation and management of acute appendicitis.   PAST MEDICAL HISTORY (PMH):  Past Medical History:  Diagnosis Date   Asthma    COVID-19 11/2019   hospitalized x2   GERD (gastroesophageal reflux disease)    Headache    Hypertension    Sleep apnea     Reviewed. Otherwise negative.   PAST SURGICAL HISTORY (PSH):  Past Surgical History:  Procedure Laterality Date   ABDOMINAL HYSTERECTOMY     APPENDECTOMY     CATARACT EXTRACTION W/PHACO Left 06/10/2021   Procedure: CATARACT EXTRACTION PHACO AND INTRAOCULAR LENS PLACEMENT (IOC) LEFT;  Surgeon: Lockie Mola, MD;  Location: [facility];  Service: Ophthalmology;  Laterality: Left;  5.69 0:53.6 10.6%   CATARACT EXTRACTION W/PHACO Right 06/24/2021   Procedure: CATARACT EXTRACTION PHACO AND INTRAOCULAR LENS PLACEMENT (IOC) RIGHT;  Surgeon: Lockie Mola, MD;  Location: [facility];  Service: Ophthalmology;  Laterality: Right;  3.61 00:52.4   CHOLECYSTECTOMY     ELBOW BURSA SURGERY Right    FRENULECTOMY, LINGUAL     and removal of portion of soft palate   LUMBAR DISC SURGERY  1996   L3-L5   LUMBAR DISC SURGERY   2004   LUMBAR FUSION  2006   bone harvest rods (2) and screws (36)   LUMBAR LAMINECTOMY  1998   L3-L5   MAXILLARY ANTROSTOMY Right 08/12/2022   Procedure: MAXILLARY ANTROSTOMY REVISION;  Surgeon: Vernie Murders, MD;  Location: [facility];  Service: ENT;  Laterality: Right;  placed disk on OR charge nurse desk 8-8 kp   OOPHORECTOMY     1 ovary removed   TOTAL KNEE ARTHROPLASTY Right 11/17/2021   Procedure: TOTAL KNEE ARTHROPLASTY;  Surgeon: [name], MD;  Location: [facility];  Service: Orthopedics;  Laterality: Right;   UVULOPALATOPHARYNGOPLASTY  2001    Reviewed. Otherwise negative.   MEDICATIONS:  Prior to Admission medications   Medication Sig Start Date End Date Taking? Authorizing Provider  albuterol (VENTOLIN HFA) 108 (90 Base) MCG/ACT inhaler Inhale 2 puffs into the lungs every 4 (four) hours as needed for wheezing.   Yes [provider]  Cholecalciferol (DIALYVITE VITAMIN D 5000) 125 MCG (5000 UT) capsule Take 5,000 Units by mouth daily.   Yes [provider]  estradiol (ESTRACE) 0.5 MG tablet Take 0.5 mg by mouth daily. 10/15/19  Yes [provider]  ferrous sulfate 325 (65 FE) MG EC tablet Take 325 mg by mouth 2 (two) times a week.   Yes [provider]  gabapentin (NEURONTIN) 300 MG capsule Take 300 mg by mouth at bedtime.   Yes [provider]  montelukast (SINGULAIR) 10 MG tablet Take 10 mg by mouth daily.   Yes [provider]  Multiple Vitamin (MULTI-VITAMIN) tablet Take 1 tablet by  mouth daily.   Yes [provider]  nortriptyline (PAMELOR) 10 MG capsule Take 10 mg by mouth at bedtime.   Yes [provider]  olmesartan (BENICAR) 20 MG tablet Take 20 mg by mouth daily.   Yes [provider]  pantoprazole (PROTONIX) 40 MG tablet Take 40 mg by mouth 2 (two) times daily. 10/15/19  Yes [provider]  venlafaxine XR (EFFEXOR-XR) 150 MG 24 hr capsule Take 150 mg by mouth daily  with breakfast.   Yes [provider]  vitamin E 400 UNIT capsule Take 400 Units by mouth 2 (two) times daily.   Yes [provider]  zinc sulfate 220 (50 Zn) MG capsule Take 220 mg by mouth daily.   Yes [provider]  cycloSPORINE (RESTASIS) 0.05 % ophthalmic emulsion 1 drop 2 (two) times daily. Patient not taking: Reported on 05/30/2023    [provider]  ondansetron (ZOFRAN-ODT) 4 MG disintegrating tablet Take 1 tablet (4 mg total) by mouth every 8 (eight) hours as needed. Patient not taking: Reported on 05/30/2023 01/23/23   Faythe Ghee, PA-C     ALLERGIES:  Allergies  Allergen Reactions   Atorvastatin Palpitations and Other (See Comments)    Muscle pain and severe twitching   Meloxicam Rash     SOCIAL HISTORY:  Social History   Socioeconomic History   Marital status: Legally Separated    Spouse name: Not on file   Number of children: Not on file   Years of education: Not on file   Highest education level: Not on file  Occupational History   Not on file  Tobacco Use   Smoking status: Never   Smokeless tobacco: Never  Substance and Sexual Activity   Alcohol use: Not Currently    Alcohol/week: 3.0 standard drinks of alcohol    Types: 3 Glasses of wine per week   Drug use: Not Currently   Sexual activity: Not on file  Other Topics Concern   Not on file  Social History Narrative   Not on file   Social Determinants of Health   Financial Resource Strain: Not on file  Food Insecurity: No Food Insecurity (05/31/2023)   Hunger Vital Sign    Worried About Running Out of Food in the Last Year: Never true    Ran Out of Food in the Last Year: Never true  Transportation Needs: No Transportation Needs (05/31/2023)   PRAPARE - Administrator, Civil Service (Medical): No    Lack of Transportation (Non-Medical): No  Physical Activity: Not on file  Stress: Not on file  Social Connections: Not on file  Intimate Partner Violence: Not  At Risk (05/31/2023)   Humiliation, Afraid, Rape, and Kick questionnaire    Fear of Current or Ex-Partner: No    Emotionally Abused: No    Physically Abused: No    Sexually Abused: No     FAMILY HISTORY:  Family History  Problem Relation Age of Onset   Emphysema Mother    Parkinson's disease Father    Dementia Father    Hypertension Father    Breast cancer Neg Hx     Otherwise negative.   REVIEW OF SYSTEMS:  Review of Systems  Constitutional:  Positive for chills and fever (Subjective).  Respiratory:  Negative for cough and shortness of breath.   Cardiovascular:  Negative for chest pain and palpitations.  Gastrointestinal:  Positive for abdominal pain and nausea. Negative for constipation, diarrhea and vomiting.  Genitourinary:  Negative for dysuria and urgency.  All other systems reviewed and are negative.   VITAL SIGNS:  Temp:  [98.4 F (36.9 C)-101.3 F (38.5 C)] 98.4 F (36.9 C) (06/04 0404) Pulse Rate:  [73-89] 81 (06/04 0404) Resp:  [12-20] 20 (06/04 0404) BP: (104-142)/(55-85) 104/55 (06/04 0404) SpO2:  [95 %-100 %] 98 % (06/04 0404) Weight:  [76.7 kg] 76.7 kg (06/03 1900)     Height: 5\' 5"  (165.1 cm) Weight: 76.7 kg BMI (Calculated): 28.12   PHYSICAL EXAM:  Physical Exam Vitals and nursing note reviewed. Exam conducted with a chaperone present.  Constitutional:      General: She is not in acute distress.    Appearance: She is well-developed. She is not ill-appearing.  HENT:     Head: Normocephalic and atraumatic.  Eyes:     General: No scleral icterus.    Extraocular Movements: Extraocular movements intact.  Cardiovascular:     Rate and Rhythm: Normal rate.     Heart sounds: Normal heart sounds. No murmur heard. Pulmonary:     Effort: Pulmonary effort is normal. No respiratory distress.     Breath sounds: Normal breath sounds.  Abdominal:     General: Abdomen is flat. There is no distension.     Palpations: Abdomen is soft.     Tenderness: There is  abdominal tenderness in the right lower quadrant. There is no guarding or rebound. Positive signs include McBurney's sign.  Genitourinary:    Comments: Deferred Skin:    General: Skin is warm and dry.     Coloration: Skin is not jaundiced or pale.  Neurological:     General: No focal deficit present.     Mental Status: She is alert and oriented to person, place, and time.  Psychiatric:        Mood and Affect: Mood normal.        Behavior: Behavior normal.     INTAKE/OUTPUT:  This shift: No intake/output data recorded.  Last 2 shifts: @IOLAST2SHIFTS @  Labs:     Latest Ref Rng & Units 05/31/2023    5:14 AM 05/30/2023    6:57 PM 01/23/2023   12:57 PM  CBC  WBC 4.0 - 10.5 K/uL 12.8  14.6  5.7   Hemoglobin 12.0 - 15.0 g/dL 16.1  09.6  04.5   Hematocrit 36.0 - 46.0 % 33.0  38.8  39.3   Platelets 150 - 400 K/uL 261  330  231       Latest Ref Rng & Units 05/31/2023    5:14 AM 05/30/2023    6:57 PM 01/23/2023   12:57 PM  CMP  Glucose 70 - 99 mg/dL 409  811  91   BUN 8 - 23 mg/dL 18  17  13    Creatinine 0.44 - 1.00 mg/dL 9.14  7.82  9.56   Sodium 135 - 145 mmol/L 140  137  134   Potassium 3.5 - 5.1 mmol/L 3.5  4.0  3.7   Chloride 98 - 111 mmol/L 106  98  105   CO2 22 - 32 mmol/L 27  24  24    Calcium 8.9 - 10.3 mg/dL 8.4  9.6  8.4   Total Protein 6.5 - 8.1 g/dL  7.6  7.1   Total Bilirubin 0.3 - 1.2 mg/dL  0.6  0.4   Alkaline Phos 38 - 126 U/L  94  68   AST 15 - 41 U/L  27  28   ALT 0 - 44 U/L  22  19      Imaging studies:   CT Abdomen/Pelvis (05/30/2023) personally reviewed with changes consistent with appendicitis, no perforation, no abscess, and radiologist report reviewed below:   IMPRESSION: 1. Acute, uncomplicated appendicitis. 2. Hepatic steatosis.   Assessment/Plan: (ICD-10's: K35.30) 72 y.o. female with acute appendicitis    - Plan for robotic assisted laparoscopic appendectomy this afternoon with Dr Aleen Campi pending OR/Anesthesia availability  - All risks,  benefits, and alternatives to above procedure(s) were discussed with the patient, all of her questions were answered to her expressed satisfaction, patient expresses she wishes to proceed, and informed consent was obtained.   - NPO + IVF Support - IV Abx (Zosyn) - Monitor abdominal examination - Pain control prn; antiemetics prn - Monitor leukocytosis  - Mobilize    All of the above findings and recommendations were discussed with the patient, and all of his questions were answered to his expressed satisfaction.  -- Lynden Oxford, PA-C [facility] 05/31/2023, 7:26 AM M-F: 7am - 4pm

## 2023-05-31 NOTE — Op Note (Signed)
  Procedure Date:  05/31/2023  Pre-operative Diagnosis:  Acute appendicitis  Post-operative Diagnosis: Acute appendicitis  Procedure:  Robotic assisted appendectomy  Surgeon:  Howie Ill, MD  Assistant:  Catie Ingland, PA-S  Anesthesia:  General endotracheal  Estimated Blood Loss:  15 ml  Specimens:  appendix  Complications:  None  Indications for Procedure:  This is a 72 y.o. female who presents with abdominal pain and workup revealing acute appendicitis.  The options of surgery versus observation were reviewed with the patient and/or family. The risks of bleeding, infection, recurrence of symptoms, negative laparoscopy, potential for an open procedure, bowel injury, abscess or infection, were all discussed with the patient and she was willing to proceed.  Description of Procedure: The patient was correctly identified in the preoperative area and brought into the operating room.  The patient was placed supine with VTE prophylaxis in place.  Appropriate time-outs were performed.  Anesthesia was induced and the patient was intubated.  Foley catheter was placed.  Appropriate antibiotics were infused.  The abdomen was prepped and draped in a sterile fashion.  A Veress needle was introduced in the left upper quadrant and pneumoperitoneum was obtained with appropriate pressures.  Using Optiview technique, an 8 mm port was introduced in the left lateral abdominal wall without complications.  Then, a 12 mm port was introduced in the left upper quadrant and an 8 mm port in the left lower quadrant under direct visualization.  There was some bleeding through the 12 mm port site, likely a branch of epigastric vessels.  This was controlled with laparoscopic cautery.  The right lower quadrant was inspected and the appendix was identified and found to be acutely inflamed with early gangrenous changes at the base but without perforation.  The appendix was carefully dissected.  The mesoappendix was  divided using the Vessel sealer.  The base of the appendix was dissected out and divided with a 45 mm blue load stapler.  The appendix was placed in an Endocatch bag along with a piece of epiploic fat.  The right lower quadrant was then inspected again revealing an intact staple line, no bleeding, and no bowel injury.  The area was thoroughly irrigated.  The 12 mm port was removed and the Endocatch bag was retrieved.  The fascial opening was closed with 0 Vicryl suture and PMI.  Local anesthetic was infused in all incisions.  The 12 mm port was closed in layers using 3-0 vicryl and 3-0 Monocryl, and the other incisions were closed with 4-0 Monocryl.  The wounds were cleaned and sealed with DermaBond.  Foley catheter was removed and the patient was emerged from anesthesia and extubated and brought to the recovery room for further management.  The patient tolerated the procedure well and all counts were correct at the end of the case.   Howie Ill, MD

## 2023-05-31 NOTE — Anesthesia Postprocedure Evaluation (Signed)
Anesthesia Post Note  Patient: Jody Brewer  Procedure(s) Performed: XI ROBOTIC LAPAROSCOPIC ASSISTED APPENDECTOMY  Patient location during evaluation: PACU Anesthesia Type: General Level of consciousness: awake and alert Pain management: pain level controlled Vital Signs Assessment: post-procedure vital signs reviewed and stable Respiratory status: spontaneous breathing, nonlabored ventilation, respiratory function stable and patient connected to nasal cannula oxygen Cardiovascular status: blood pressure returned to baseline and stable Postop Assessment: no apparent nausea or vomiting Anesthetic complications: no  No notable events documented.   Last Vitals:  Vitals:   05/31/23 1815 05/31/23 1832  BP: (!) 119/54 (!) 116/55  Pulse: 87 81  Resp: 14 18  Temp: 36.5 C 36.9 C  SpO2: 96% 92%    Last Pain:  Vitals:   05/31/23 1832  TempSrc: Oral  PainSc: 0-No pain                 Stephanie Coup

## 2023-05-31 NOTE — ED Notes (Signed)
Unit sec notified to arrange transport for pt to the floor.

## 2023-05-31 NOTE — Transfer of Care (Signed)
Immediate Anesthesia Transfer of Care Note  Patient: Jody Brewer  Procedure(s) Performed: XI ROBOTIC LAPAROSCOPIC ASSISTED APPENDECTOMY  Patient Location: PACU  Anesthesia Type:General  Level of Consciousness: drowsy  Airway & Oxygen Therapy: Patient Spontanous Breathing and Patient connected to face mask oxygen  Post-op Assessment: Report given to RN and Post -op Vital signs reviewed and stable  Post vital signs: Reviewed  Last Vitals:  Vitals Value Taken Time  BP 116/51 05/31/23 1751  Temp 36F   Pulse 93 05/31/23 1757  Resp 15 05/31/23 1757  SpO2 89 % 05/31/23 1757  Vitals shown include unvalidated device data.  Last Pain:  Vitals:   05/31/23 1408  TempSrc: Temporal  PainSc: 0-No pain      Patients Stated Pain Goal: 0 (05/31/23 1408)  Complications: No notable events documented.

## 2023-05-31 NOTE — Plan of Care (Signed)

## 2023-06-01 ENCOUNTER — Encounter: Payer: Self-pay | Admitting: Surgery

## 2023-06-01 LAB — CBC
HCT: 34.1 % — ABNORMAL LOW (ref 36.0–46.0)
Hemoglobin: 11.2 g/dL — ABNORMAL LOW (ref 12.0–15.0)
MCH: 31 pg (ref 26.0–34.0)
MCHC: 32.8 g/dL (ref 30.0–36.0)
MCV: 94.5 fL (ref 80.0–100.0)
Platelets: 269 10*3/uL (ref 150–400)
RBC: 3.61 MIL/uL — ABNORMAL LOW (ref 3.87–5.11)
RDW: 12.9 % (ref 11.5–15.5)
WBC: 9.9 10*3/uL (ref 4.0–10.5)
nRBC: 0 % (ref 0.0–0.2)

## 2023-06-01 LAB — BASIC METABOLIC PANEL
Anion gap: 5 (ref 5–15)
BUN: 14 mg/dL (ref 8–23)
CO2: 26 mmol/L (ref 22–32)
Calcium: 8.6 mg/dL — ABNORMAL LOW (ref 8.9–10.3)
Chloride: 105 mmol/L (ref 98–111)
Creatinine, Ser: 0.96 mg/dL (ref 0.44–1.00)
GFR, Estimated: >60 mL/min (ref >60)
Glucose, Bld: 134 mg/dL — ABNORMAL HIGH (ref 70–99)
Potassium: 3.9 mmol/L (ref 3.5–5.1)
Sodium: 136 mmol/L (ref 135–145)

## 2023-06-01 LAB — HIV ANTIBODY (ROUTINE TESTING W REFLEX): HIV Screen 4th Generation wRfx: NONREACTIVE

## 2023-06-01 MED ORDER — IBUPROFEN 600 MG PO TABS: 600.0000 mg | ORAL_TABLET | Freq: Four times a day (QID) | ORAL | 0 refills | Status: DC | PRN

## 2023-06-01 MED ORDER — OXYCODONE HCL 5 MG PO TABS: 5.0000 mg | ORAL_TABLET | Freq: Four times a day (QID) | ORAL | 0 refills | Status: DC | PRN

## 2023-06-01 MED ORDER — PANTOPRAZOLE SODIUM 40 MG PO TBEC: 40.0000 mg | DELAYED_RELEASE_TABLET | Freq: Every day | ORAL | Status: DC

## 2023-06-01 MED ORDER — AMOXICILLIN-POT CLAVULANATE 875-125 MG PO TABS: 1.0000 | ORAL_TABLET | Freq: Two times a day (BID) | ORAL | 0 refills | Status: AC

## 2023-06-01 MED ORDER — IBUPROFEN 400 MG PO TABS
600.0000 mg | ORAL_TABLET | Freq: Three times a day (TID) | ORAL | Status: DC
  Administered 2023-06-01: 600 mg via ORAL
  Filled 2023-06-01: qty 2

## 2023-06-01 NOTE — TOC CM/SW Note (Signed)
Transition of Care [facility]) - Inpatient Brief Assessment   Patient Details  Name: Jody Brewer MRN: [id number] Date of Birth: Oct 24, 1951  Transition of Care [facility]) CM/SW Contact:    Chapman Fitch, RN Phone Number: 06/01/2023, 10:22 AM   Clinical Narrative:    Transition of Care Asessment: Insurance and Status: Insurance coverage has been reviewed Patient has primary care physician: Yes     Prior/Current Home Services: No current home services Social Determinants of Health Reivew: SDOH reviewed no interventions necessary Readmission risk has been reviewed: Yes Transition of care needs: no transition of care needs at this time

## 2023-06-01 NOTE — Care Management Obs Status (Signed)
MEDICARE OBSERVATION STATUS NOTIFICATION   Patient Details  Name: Jody Brewer MRN: [id number] Date of Birth: 29-Feb-1951   Medicare Observation Status Notification Given:  Yes    Chapman Fitch, RN 06/01/2023, 10:19 AM

## 2023-06-01 NOTE — Discharge Instructions (Signed)
In addition to included general post-operative instructions,  Diet: Resume home diet.   Activity: No heavy lifting >20 pounds (children, pets, laundry, garbage) or strenuous activity for 4 weeks, but light activity and walking are encouraged. Do not drive or drink alcohol if taking narcotic pain medications or having pain that might distract from driving.  Wound care: 2 days after surgery (06/06), you may shower/get incision wet with soapy water and pat dry (do not rub incisions), but no baths or submerging incision underwater until follow-up.   Medications: Resume all home medications. For mild to moderate pain: acetaminophen (Tylenol) or ibuprofen/naproxen (if no kidney disease). Combining Tylenol with alcohol can substantially increase your risk of causing liver disease. Narcotic pain medications, if prescribed, can be used for severe pain, though may cause nausea, constipation, and drowsiness. Do not combine Tylenol and Percocet (or similar) within a 6 hour period as Percocet (and similar) contain(s) Tylenol. If you do not need the narcotic pain medication, you do not need to fill the prescription.  Call office [phone] / [phone]) at any time if any questions, worsening pain, fevers/chills, bleeding, drainage from incision site, or other concerns.

## 2023-06-01 NOTE — Discharge Summary (Signed)
[facility] SURGICAL DISCHARGE SUMMARY  Patient ID: Jody Brewer MRN: [id number] DOB/AGE: Jan 08, 1951 72 y.o.  Admit date: 05/30/2023 Discharge date: 06/01/2023  Discharge Diagnoses Patient Active Problem List   Diagnosis Date Noted   Acute appendicitis 05/30/2023    Consultants None  Procedures 05/31/2023:  Robotic assisted laparoscopic appendectomy  HPI: 72 y.o. female presented to [facility] ED yesterday for abdominal pain. Patient reports the acute onset of right lower abdominal pain around 1300. This was mild at first but continued to progressively worsen throughout the day. She reports subjective fever, chills, nausea, and distension. No CP, cough, SOB, urinary changes, emesis, or bowel changes. No history of similar. Previous abdominal surgeries positive for abdominal hysterectomy, oophorectomy, cholecystectomy. Work up in the ED revealed a leukocytosis to 14.6K, Hgb to 13.0, CMP is reassuring. CT Abdomen/Pelvis with acute appendicitis. She is currently on Zosyn.   Hospital Course: Informed consent was obtained and documented, and patient underwent uneventful robotic assisted laparoscopic appendectomy (Dr Aleen Campi, 05/31/2023).  Post-operatively, patient's improved/resolved and advancement of patient's diet and ambulation were well-tolerated. The remainder of patient's hospital course was essentially unremarkable, and discharge planning was initiated accordingly with patient safely able to be discharged home with appropriate discharge instructions, antibiotics (Augmentin x9 days), pain control, and outpatient follow-up after all of her questions were answered to her expressed satisfaction.   Discharge Condition: Good   Physical Examination:  Constitutional: Well appearing female; NAD Pulmonary: Normal effort, no respiratory distress Gastrointestinal: Soft, incisional soreness, non-distended, no rebound or guarding  Skin: Laparoscopic incisions are CDI with  dermabond, no erythema    Allergies as of 06/01/2023       Reactions   Atorvastatin Palpitations, Other (See Comments)   Muscle pain and severe twitching   Meloxicam Rash   Tolerates ibuprofen        Medication List     TAKE these medications    albuterol 108 (90 Base) MCG/ACT inhaler Commonly known as: VENTOLIN HFA Inhale 2 puffs into the lungs every 4 (four) hours as needed for wheezing.   cycloSPORINE 0.05 % ophthalmic emulsion Commonly known as: RESTASIS 1 drop 2 (two) times daily.   Dialyvite Vitamin D 5000 125 MCG (5000 UT) capsule Generic drug: Cholecalciferol Take 5,000 Units by mouth daily.   estradiol 0.5 MG tablet Commonly known as: ESTRACE Take 0.5 mg by mouth daily.   ferrous sulfate 325 (65 FE) MG EC tablet Take 325 mg by mouth 2 (two) times a week.   gabapentin 300 MG capsule Commonly known as: NEURONTIN Take 300 mg by mouth at bedtime.   ibuprofen 600 MG tablet Commonly known as: ADVIL Take 1 tablet (600 mg total) by mouth every 6 (six) hours as needed.   montelukast 10 MG tablet Commonly known as: SINGULAIR Take 10 mg by mouth daily.   Multi-Vitamin tablet Take 1 tablet by mouth daily.   nortriptyline 10 MG capsule Commonly known as: PAMELOR Take 10 mg by mouth at bedtime.   olmesartan 20 MG tablet Commonly known as: BENICAR Take 20 mg by mouth daily.   ondansetron 4 MG disintegrating tablet Commonly known as: ZOFRAN-ODT Take 1 tablet (4 mg total) by mouth every 8 (eight) hours as needed.   oxyCODONE 5 MG immediate release tablet Commonly known as: Oxy IR/ROXICODONE Take 1 tablet (5 mg total) by mouth every 6 (six) hours as needed for severe pain or breakthrough pain.   pantoprazole 40 MG tablet Commonly known as: PROTONIX Take 40 mg by mouth 2 (two)  times daily.   venlafaxine XR 150 MG 24 hr capsule Commonly known as: EFFEXOR-XR Take 150 mg by mouth daily with breakfast.   vitamin E 180 MG (400 UNITS) capsule Take 400 Units  by mouth 2 (two) times daily.   zinc sulfate 220 (50 Zn) MG capsule Take 220 mg by mouth daily.          Follow-up Information     Donovan Kail, PA-C. Schedule an appointment as soon as possible for a visit in 3 week(s).   Specialty: Physician Assistant Why: s/p robotic assisted laparoscopic appendectomy Contact information: [address] [place] [address] [phone]                  Time spent on discharge management including discussion of hospital course, clinical condition, outpatient instructions, prescriptions, and follow up with the patient and members of the medical team: >30 minutes  -- Lynden Oxford , PA-C [facility]  06/01/2023, 9:15 AM [phone] M-F: 7am - 4pm

## 2023-06-01 NOTE — Progress Notes (Signed)
PHARMACIST - PHYSICIAN COMMUNICATION  CONCERNING: IV to Oral Route Change Policy  RECOMMENDATION: This patient is receiving pantoprazole by the intravenous route.  Based on criteria approved by the Pharmacy and Therapeutics Committee, the intravenous medication(s) is/are being converted to the equivalent oral dose form(s).  DESCRIPTION: These criteria include: The patient is eating (either orally or via tube) and/or has been taking other orally administered medications for a least 24 hours The patient has no evidence of active gastrointestinal bleeding or impaired GI absorption (gastrectomy, short bowel, patient on TNA or NPO).  If you have questions about this conversion, please contact the Pharmacy Department   Tressie Ellis, [facility] 06/01/2023 7:57 AM

## 2023-06-01 NOTE — Progress Notes (Signed)
Discharge instructions were reviewed with patient and family. Questions were encourage and answered.

## 2023-06-01 NOTE — Care Management CC44 (Signed)
Condition Code 44 Documentation Completed  Patient Details  Name: DICIE PIPPERT MRN: [id number] Date of Birth: 08-09-1951   Condition Code 44 given:  Yes Patient signature on Condition Code 44 notice:  Yes Documentation of 2 MD's agreement:  Yes Code 44 added to claim:  Yes    Chapman Fitch, RN 06/01/2023, 10:19 AM

## 2023-06-04 LAB — CULTURE, BLOOD (ROUTINE X 2)
Culture: NO GROWTH
Culture: NO GROWTH

## 2023-06-06 LAB — SURGICAL PATHOLOGY

## 2023-06-23 ENCOUNTER — Encounter: Payer: Self-pay | Admitting: Physician Assistant

## 2023-06-23 ENCOUNTER — Ambulatory Visit (INDEPENDENT_AMBULATORY_CARE_PROVIDER_SITE_OTHER): Payer: Medicare Other | Admitting: Physician Assistant

## 2023-06-23 VITALS — BP 102/67 | HR 80 | Temp 98.0°F | Ht 65.0 in | Wt 168.0 lb

## 2023-06-23 DIAGNOSIS — Z09 Encounter for follow-up examination after completed treatment for conditions other than malignant neoplasm: Secondary | ICD-10-CM

## 2023-06-23 DIAGNOSIS — K3580 Unspecified acute appendicitis: Secondary | ICD-10-CM

## 2023-06-23 NOTE — Patient Instructions (Signed)

## 2023-06-23 NOTE — Progress Notes (Signed)
[facility] POST-OP OFFICE VISIT  06/23/2023  HPI: Jody Brewer is a 72 y.o. female 23 days s/p robotic assisted laparoscopic appendectomy with Dr Aleen Campi   She is doing very well States "this was the best surgery Ive had." No abdominal pina; did not need pain medications No fever, chills, nausea, emesis Tolerating PO; no bowel issue Incisions healing well Ambulating well No other complaints   Vital signs: BP 102/67   Pulse 80   Temp 98 F (36.7 C)   Ht 5\' 5"  (1.651 m)   Wt 168 lb (76.2 kg)   SpO2 98%   BMI 27.96 kg/m    Physical Exam: Constitutional: Well appearing female, NAD Abdomen: Soft, non-tender, non-distended, no rebound/guarding Skin: Laparoscopic incisions are healing well, no erythema or drainage   Assessment/Plan: This is a 72 y.o. female 23 days s/p robotic assisted laparoscopic appendectomy with Dr Aleen Campi    - Pain control prn  - Reviewed wound care recommendation  - Reviewed lifting restrictions; 4 weeks total  - Reviewed surgical pathology; Appendicitis   - She can follow up on as needed basis; She understands to call with questions/concerns  -- Lynden Oxford, PA-C [facility] 06/23/2023, 1:47 PM M-F: 7am - 4pm

## 2023-07-15 ENCOUNTER — Emergency Department
Admission: EM | Admit: 2023-07-15 | Discharge: 2023-07-15 | Disposition: A | Discharge status: Home / Self Care | Payer: Medicare Other | Attending: Emergency Medicine | Admitting: Emergency Medicine

## 2023-07-15 ENCOUNTER — Emergency Department: Payer: Medicare Other

## 2023-07-15 ENCOUNTER — Other Ambulatory Visit: Payer: Self-pay

## 2023-07-15 DIAGNOSIS — I10 Essential (primary) hypertension: Secondary | ICD-10-CM | POA: Diagnosis not present

## 2023-07-15 DIAGNOSIS — R140 Abdominal distension (gaseous): Secondary | ICD-10-CM | POA: Diagnosis not present

## 2023-07-15 DIAGNOSIS — R42 Dizziness and giddiness: Secondary | ICD-10-CM

## 2023-07-15 DIAGNOSIS — R1084 Generalized abdominal pain: Secondary | ICD-10-CM | POA: Diagnosis not present

## 2023-07-15 DIAGNOSIS — J45909 Unspecified asthma, uncomplicated: Secondary | ICD-10-CM | POA: Diagnosis not present

## 2023-07-15 DIAGNOSIS — E860 Dehydration: Secondary | ICD-10-CM | POA: Insufficient documentation

## 2023-07-15 LAB — URINALYSIS, W/ REFLEX TO CULTURE (INFECTION SUSPECTED)
Bilirubin Urine: NEGATIVE
Glucose, UA: NEGATIVE mg/dL
Hgb urine dipstick: NEGATIVE
Ketones, ur: NEGATIVE mg/dL
Nitrite: NEGATIVE
Protein, ur: NEGATIVE mg/dL
Specific Gravity, Urine: 1.031 — ABNORMAL HIGH (ref 1.005–1.030)
pH: 6 (ref 5.0–8.0)

## 2023-07-15 LAB — CBC WITH DIFFERENTIAL/PLATELET
Abs Immature Granulocytes: 0.12 10*3/uL — ABNORMAL HIGH (ref 0.00–0.07)
Basophils Absolute: 0.1 10*3/uL (ref 0.0–0.1)
Basophils Relative: 1 %
Eosinophils Absolute: 0 10*3/uL (ref 0.0–0.5)
Eosinophils Relative: 0 %
HCT: 38.4 % (ref 36.0–46.0)
Hemoglobin: 12.5 g/dL (ref 12.0–15.0)
Immature Granulocytes: 1 %
Lymphocytes Relative: 31 %
Lymphs Abs: 3.3 10*3/uL (ref 0.7–4.0)
MCH: 30 pg (ref 26.0–34.0)
MCHC: 32.6 g/dL (ref 30.0–36.0)
MCV: 92.3 fL (ref 80.0–100.0)
Monocytes Absolute: 0.8 10*3/uL (ref 0.1–1.0)
Monocytes Relative: 7 %
Neutro Abs: 6.3 10*3/uL (ref 1.7–7.7)
Neutrophils Relative %: 60 %
Platelets: 238 10*3/uL (ref 150–400)
RBC: 4.16 MIL/uL (ref 3.87–5.11)
RDW: 13.3 % (ref 11.5–15.5)
Smear Review: NORMAL
WBC: 10.7 10*3/uL — ABNORMAL HIGH (ref 4.0–10.5)
nRBC: 0 % (ref 0.0–0.2)

## 2023-07-15 LAB — COMPREHENSIVE METABOLIC PANEL
ALT: 32 U/L (ref 0–44)
AST: 30 U/L (ref 15–41)
Albumin: 3.5 g/dL (ref 3.5–5.0)
Alkaline Phosphatase: 93 U/L (ref 38–126)
Anion gap: 10 (ref 5–15)
BUN: 25 mg/dL — ABNORMAL HIGH (ref 8–23)
CO2: 26 mmol/L (ref 22–32)
Calcium: 8.7 mg/dL — ABNORMAL LOW (ref 8.9–10.3)
Chloride: 100 mmol/L (ref 98–111)
Creatinine, Ser: 1.06 mg/dL — ABNORMAL HIGH (ref 0.44–1.00)
GFR, Estimated: 57 mL/min — ABNORMAL LOW (ref >60)
Glucose, Bld: 99 mg/dL (ref 70–99)
Potassium: 3.8 mmol/L (ref 3.5–5.1)
Sodium: 136 mmol/L (ref 135–145)
Total Bilirubin: 0.5 mg/dL (ref 0.3–1.2)
Total Protein: 6.7 g/dL (ref 6.5–8.1)

## 2023-07-15 LAB — TROPONIN I (HIGH SENSITIVITY): Troponin I (High Sensitivity): 5 ng/L (ref <18)

## 2023-07-15 LAB — LIPASE, BLOOD: Lipase: 39 U/L (ref 11–51)

## 2023-07-15 MED ORDER — SODIUM CHLORIDE 0.9 % IV BOLUS
1000.0000 mL | Freq: Once | INTRAVENOUS | Status: AC
  Administered 2023-07-15: 1000 mL via INTRAVENOUS

## 2023-07-15 MED ORDER — FENTANYL CITRATE PF 50 MCG/ML IJ SOSY
50.0000 ug | PREFILLED_SYRINGE | Freq: Once | INTRAMUSCULAR | Status: AC
  Administered 2023-07-15: 50 ug via INTRAVENOUS

## 2023-07-15 MED ORDER — LIDOCAINE HCL (PF) 1 % IJ SOLN: 5.0000 mL | Freq: Once | INTRAMUSCULAR | Status: DC

## 2023-07-15 MED ORDER — IOHEXOL 300 MG/ML  SOLN
100.0000 mL | Freq: Once | INTRAMUSCULAR | Status: AC | PRN
  Administered 2023-07-15: 100 mL via INTRAVENOUS

## 2023-07-15 MED ORDER — ONDANSETRON HCL 4 MG/2ML IJ SOLN
4.0000 mg | Freq: Once | INTRAMUSCULAR | Status: AC | PRN
  Administered 2023-07-15: 4 mg via INTRAVENOUS

## 2023-07-15 NOTE — ED Notes (Signed)
Pt to CT at this time.

## 2023-07-15 NOTE — ED Notes (Signed)
No second trop per VO of ED provider

## 2023-07-15 NOTE — ED Notes (Signed)
Pt verbalizes understanding of discharge instructions. Opportunity for questioning and answers were provided. Pt discharged from ED to home with daughter.    

## 2023-07-15 NOTE — ED Triage Notes (Signed)
Pt presents to the ED via [facility]. Pt was getting clothes altered and started feeling dizzy. EMS states that they got multiple SBP's ranging from 88-110. Pt had an appendectomy 6 weeks ago. Pt states that since the appendectomy she she has had loss of appetite. Pt states that about 3-4 days ago she started having abdominal pain that worsens when eating. She also reports abdominal distention after eating. Pt reports fevers at home. States that her temp was 101 this morning, but she took motrin at 10am and EMS got a temp of 98.8. Pt A&Ox4 at time of triage. Received naCl with EMS

## 2023-07-15 NOTE — Discharge Instructions (Addendum)
Please seek medical attention for any high fevers, chest pain, shortness of breath, change in behavior, persistent vomiting, bloody stool or any other new or concerning symptoms.  

## 2023-07-15 NOTE — ED Notes (Signed)
Pt's O2 saturation dropped after fentanyl administration. Placed on 2L [place]. Improved O2 sats noted. ED provider notified.

## 2023-07-15 NOTE — ED Provider Notes (Signed)
[facility] Provider Note   Event Date/Time   First MD Initiated Contact with Patient 07/15/23 1208     (approximate) History  Dizziness  HPI Jody Brewer is a 72 y.o. female with a past medical history of hypertension, asthma, and GERD who presents after becoming lightheaded while getting close altered and found to be hypotensive by EMS with systolics in the 80s.  Patient arrives with blood pressure of 113/58.  Patient also complains of intermittent abdominal pain over the last 4 days that is worse after eating stating that it feels like her stomach is blowing up like a balloon.  Patient does also states she has had a fever at home to 101 this morning but took Motrin.  Patient describes 4/10, generalized abdominal pain that "moves all over" ROS: Patient currently denies any vision changes, tinnitus, difficulty speaking, facial droop, sore throat, chest pain, shortness of breath, nausea/vomiting/diarrhea, dysuria, or weakness/numbness/paresthesias in any extremity   Physical Exam  Triage Vital Signs: ED Triage Vitals  Encounter Vitals Group     BP 07/15/23 1214 (!) 113/58     Systolic BP Percentile --      Diastolic BP Percentile --      Pulse Rate 07/15/23 1214 82     Resp 07/15/23 1214 14     Temp 07/15/23 1214 98.8 F (37.1 C)     Temp Source 07/15/23 1214 Oral     SpO2 07/15/23 1214 98 %     Weight --      Height 07/15/23 1215 5\' 5"  (1.651 m)     Head Circumference --      Peak Flow --      Pain Score 07/15/23 1215 4     Pain Loc --      Pain Education --      Exclude from Growth Chart --    Most recent vital signs: Vitals:   07/15/23 1630 07/15/23 1632  BP: (!) 105/56 109/60  Pulse: 80 94  Resp: 15 12  Temp:    SpO2:  100%   General: Awake, oriented x4. CV:  Good peripheral perfusion.  Resp:  Normal effort.  Abd:  No distention.  Other:  Elderly overweight Caucasian female laying in bed in no acute distress ED Results / Procedures /  Treatments  Labs (all labs ordered are listed, but only abnormal results are displayed) Labs Reviewed  COMPREHENSIVE METABOLIC PANEL - Abnormal; Notable for the following components:      Result Value   BUN 25 (*)    Creatinine, Ser 1.06 (*)    Calcium 8.7 (*)    GFR, Estimated 57 (*)    All other components within normal limits  CBC WITH DIFFERENTIAL/PLATELET - Abnormal; Notable for the following components:   WBC 10.7 (*)    Abs Immature Granulocytes 0.12 (*)    All other components within normal limits  URINALYSIS, W/ REFLEX TO CULTURE (INFECTION SUSPECTED) - Abnormal; Notable for the following components:   Color, Urine STRAW (*)    APPearance CLEAR (*)    Specific Gravity, Urine 1.031 (*)    Leukocytes,Ua TRACE (*)    Bacteria, UA RARE (*)    All other components within normal limits  URINE CULTURE  LIPASE, BLOOD  TROPONIN I (HIGH SENSITIVITY)  TROPONIN I (HIGH SENSITIVITY)   EKG ED ECG REPORT I, Merwyn Katos, the attending physician, personally viewed and interpreted this ECG. Date: 07/15/2023 EKG Time: 1212 Rate: 82 Rhythm: normal sinus  rhythm QRS Axis: normal Intervals: normal ST/T Wave abnormalities: normal Narrative Interpretation: no evidence of acute ischemia RADIOLOGY ED MD interpretation: CT abdomen pelvis with IV contrast interpreted independently by me shows no evidence of intestinal obstruction or pneumoperitoneum.  There is no hydronephrosis or hiatal hernia.  There is few diverticula seen in the colon without any evidence of diverticulitis -Agree with radiology assessment Official radiology report(s): CT ABDOMEN PELVIS W CONTRAST  Result Date: 07/15/2023 CLINICAL DATA:  Abdominal pain, dizziness EXAM: CT ABDOMEN AND PELVIS WITH CONTRAST TECHNIQUE: Multidetector CT imaging of the abdomen and pelvis was performed using the standard protocol following bolus administration of intravenous contrast. RADIATION DOSE REDUCTION: This exam was performed according  to the departmental dose-optimization program which includes automated exposure control, adjustment of the mA and/or kV according to patient size and/or use of iterative reconstruction technique. CONTRAST:  OMNIPAQUE IOHEXOL 300 MG/ML  SOLN COMPARISON:  05/30/2023 FINDINGS: Lower chest: There is mild ectasia bronchi in the lower lung fields. There is 3 mm nodular density in right middle lobe with no significant change. Small linear patchy densities are seen in the posterior lower lung fields. Hepatobiliary: No focal abnormalities are seen in the liver. Surgical clips are seen in gallbladder fossa. Pancreas: There is prominence of pancreatic duct. No focal abnormalities are seen in the pancreas. Spleen: Unremarkable. Adrenals/Urinary Tract: Adrenals are unremarkable. There is no hydronephrosis. There are no renal or ureteral stones. Urinary bladder is unremarkable. Stomach/Bowel: Small hiatal hernia is seen. Small bowel loops are not dilated. There is evidence of previous appendectomy. There is no significant wall thickening in colon. Few diverticula seen in colon. There are no signs of focal diverticulitis. Vascular/Lymphatic: Scattered arterial calcifications are seen. Beam hardening artifacts from surgical hardware limit evaluation. Reproductive: Uterus is not seen. Other: There is no ascites or pneumoperitoneum. Umbilical hernia containing fat is seen. Musculoskeletal: There is previous fusion in lumbar spine and the lower thoracic spine. IMPRESSION: There is no evidence of intestinal obstruction or pneumoperitoneum. There is no hydronephrosis. Small hiatal hernia. Few diverticula seen in the colon without signs of focal diverticulitis. Aortic arteriosclerosis. Small linear densities in the lower lung fields may suggest scarring or subsegmental atelectasis. Other findings as described in the body of the report. Electronically Signed   By: Ernie Avena M.D.   On: 07/15/2023 14:24    PROCEDURES: Critical Care performed: No .1-3 Lead EKG Interpretation  Performed by: Merwyn Katos, MD Authorized by: Merwyn Katos, MD     Interpretation: normal     ECG rate:  71   ECG rate assessment: normal     Rhythm: sinus rhythm     Ectopy: none     Conduction: normal    MEDICATIONS ORDERED IN ED: Medications  sodium chloride 0.9 % bolus 1,000 mL (0 mLs Intravenous Stopped 07/15/23 1421)  iohexol (OMNIPAQUE) 300 MG/ML solution 100 mL (100 mLs Intravenous Contrast Given 07/15/23 1400)  fentaNYL (SUBLIMAZE) injection 50 mcg (50 mcg Intravenous Given 07/15/23 1421)  ondansetron (ZOFRAN) injection 4 mg (4 mg Intravenous Given 07/15/23 1421)  fentaNYL (SUBLIMAZE) injection 50 mcg (50 mcg Intravenous Given 07/15/23 1636)   IMPRESSION / MDM / ASSESSMENT AND PLAN / ED COURSE  I reviewed the triage vital signs and the nursing notes.                             The patient is on the cardiac monitor to evaluate for evidence  of arrhythmia and/or significant heart rate changes. Patient's presentation is most consistent with acute presentation with potential threat to life or bodily function. Patients symptoms not typical for emergent causes of abdominal pain such as, but not limited to, appendicitis, abdominal aortic aneurysm, surgical biliary disease, pancreatitis, SBO, mesenteric ischemia, serious intra-abdominal bacterial illness. Presentation also not typical of gynecologic emergencies such as TOA, Ovarian Torsion, PID. Not Ectopic. Doubt atypical ACS.  Pt tolerating PO. Disposition: Care of this patient will be signed out to the oncoming physician at the end of my shift.  All pertinent patient information conveyed and all questions answered.  All further care and disposition decisions will be made by the oncoming physician.   FINAL CLINICAL IMPRESSION(S) / ED DIAGNOSES   Final diagnoses:  Abdominal distension  Lightheadedness  Dehydration   Rx / DC Orders   ED Discharge  Orders     None      Note:  This document was prepared using Dragon voice recognition software and may include unintentional dictation errors.   Merwyn Katos, MD 07/15/23 [phone]

## 2023-07-16 LAB — URINE CULTURE

## 2023-07-17 LAB — URINE CULTURE: Culture: 30000 — AB

## 2023-08-30 ENCOUNTER — Ambulatory Visit: Payer: Medicare Other | Admitting: Urology

## 2023-08-30 ENCOUNTER — Encounter: Payer: Self-pay | Admitting: Urology

## 2023-08-30 VITALS — BP 169/90 | HR 80 | Ht 65.0 in | Wt 168.0 lb

## 2023-08-30 DIAGNOSIS — Z09 Encounter for follow-up examination after completed treatment for conditions other than malignant neoplasm: Secondary | ICD-10-CM

## 2023-08-30 DIAGNOSIS — N390 Urinary tract infection, site not specified: Secondary | ICD-10-CM

## 2023-08-30 DIAGNOSIS — Z87440 Personal history of urinary (tract) infections: Secondary | ICD-10-CM

## 2023-08-30 MED ORDER — NITROFURANTOIN MONOHYD MACRO 100 MG PO CAPS: 100.0000 mg | ORAL_CAPSULE | Freq: Every day | ORAL | 0 refills | Status: DC

## 2023-08-30 MED ORDER — ESTRADIOL 0.1 MG/GM VA CREA
TOPICAL_CREAM | VAGINAL | 12 refills | Status: DC
Start: 2023-08-30 — End: 2024-02-01

## 2023-08-30 NOTE — Progress Notes (Signed)
08/30/23 1:04 PM   Jody Brewer [birth year] [id number]  CC: Recurrent UTIs  HPI: 72 year old female who is here with one of her friends today and reports recurrent/chronic UTI over the last 3 months.  She has multiple positive cultures for E. coli over the last few months, and these were reviewed in Care Everywhere.  Primary UTI symptoms are urgency, lower abdominal pain, dysuria, symptoms improved temporarily with antibiotic but seem to return within a few days.  Currently is on a 2-week course of Augmentin, and denies any significant urinary symptoms today aside from mild urgency.  She also had a CT performed 07/15/2023 which was benign and showed no evidence of hydronephrosis or stones.  She reports gross hematuria at the time of original UTI, but that has resolved since taking antibiotics.   PMH: Past Medical History:  Diagnosis Date   Asthma    COVID-19 11/2019   hospitalized x2   GERD (gastroesophageal reflux disease)    Headache    Hypertension    Sleep apnea     Surgical History: Past Surgical History:  Procedure Laterality Date   ABDOMINAL HYSTERECTOMY     APPENDECTOMY     CATARACT EXTRACTION W/PHACO Left 06/10/2021   Procedure: CATARACT EXTRACTION PHACO AND INTRAOCULAR LENS PLACEMENT (IOC) LEFT;  Surgeon: Lockie Mola, MD;  Location: [facility];  Service: Ophthalmology;  Laterality: Left;  5.69 0:53.6 10.6%   CATARACT EXTRACTION W/PHACO Right 06/24/2021   Procedure: CATARACT EXTRACTION PHACO AND INTRAOCULAR LENS PLACEMENT (IOC) RIGHT;  Surgeon: Lockie Mola, MD;  Location: [facility];  Service: Ophthalmology;  Laterality: Right;  3.61 00:52.4   CHOLECYSTECTOMY     ELBOW BURSA SURGERY Right    FRENULECTOMY, LINGUAL     and removal of portion of soft palate   LUMBAR DISC SURGERY  1996   L3-L5   LUMBAR DISC SURGERY  2004   LUMBAR FUSION  2006   bone harvest rods (2) and screws (36)   LUMBAR LAMINECTOMY  1998   L3-L5    MAXILLARY ANTROSTOMY Right 08/12/2022   Procedure: MAXILLARY ANTROSTOMY REVISION;  Surgeon: Vernie Murders, MD;  Location: [facility];  Service: ENT;  Laterality: Right;  placed disk on OR charge nurse desk 8-8 kp   OOPHORECTOMY     1 ovary removed   TOTAL KNEE ARTHROPLASTY Right 11/17/2021   Procedure: TOTAL KNEE ARTHROPLASTY;  Surgeon: [name], MD;  Location: [facility];  Service: Orthopedics;  Laterality: Right;   UVULOPALATOPHARYNGOPLASTY  2001   XI ROBOTIC LAPAROSCOPIC ASSISTED APPENDECTOMY N/A 05/31/2023   Procedure: XI ROBOTIC LAPAROSCOPIC ASSISTED APPENDECTOMY;  Surgeon: Henrene Dodge, MD;  Location: [facility];  Service: General;  Laterality: N/A;   Family History: Family History  Problem Relation Age of Onset   Emphysema Mother    Parkinson's disease Father    Dementia Father    Hypertension Father    Breast cancer Neg Hx     Social History:  reports that she has never smoked. She has never been exposed to tobacco smoke. She has never used smokeless tobacco. She reports that she does not currently use alcohol after a past usage of about 3.0 standard drinks of alcohol per week. She reports that she does not currently use drugs.  Physical Exam: Wt 168 lb (76.2 kg)   BMI 27.96 kg/m    Constitutional:  Alert and oriented, No acute distress. Cardiovascular: No clubbing, cyanosis, or edema. Respiratory: Normal respiratory effort, no increased work of breathing. GI: Abdomen  is soft, nontender, nondistended, no abdominal masses   Laboratory Data: Reviewed, cultures reviewed in Care Everywhere  Pertinent Imaging: I have personally viewed and interpreted the CT scan dated 07/15/2023 showing no urologic abnormalities.  Assessment & Plan:   72 year old female with recurrent E. 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  Other possible etiologies could include colovesical fistula or bladder lesion, though less likely  based on recent normal CT.  We discussed the evaluation and treatment of patients with recurrent UTIs at length.  We specifically discussed the differences between asymptomatic bacteriuria and true urinary tract infection.  We discussed the AUA definition of recurrent UTI of at least 2 culture proven symptomatic acute cystitis episodes in a 39-month period, or 3 within a 1 year period.  We discussed the importance of culture directed antibiotic treatment, and antibiotic stewardship.  First-line therapy includes nitrofurantoin(5 days), Bactrim(3 days), or fosfomycin(3 g single dose).  Possible etiologies of recurrent infection include periurethral tissue atrophy in postmenopausal woman, constipation, sexual activity, incomplete emptying, anatomic abnormalities, and even genetic predisposition.  Finally, we discussed the role of perineal hygiene, timed voiding, adequate hydration, topical vaginal estrogen, cranberry prophylaxis, and low-dose antibiotic prophylaxis.  Trial of topical estrogen cream, cranberry tablet prophylaxis, nitrofurantoin prophylaxis x 90 days RTC 2 to 3 months symptom check, consider cystoscopy in the future if recurrent infections despite above  Legrand Rams, MD 08/30/2023  [facility] [address] [place], [place] [address] [phone]

## 2023-10-21 ENCOUNTER — Ambulatory Visit: Payer: Medicare Other | Admitting: Physician Assistant

## 2023-10-21 ENCOUNTER — Encounter: Payer: Self-pay | Admitting: Physician Assistant

## 2023-10-21 VITALS — BP 118/74 | HR 98 | Ht 65.0 in | Wt 168.0 lb

## 2023-10-21 DIAGNOSIS — R82998 Other abnormal findings in urine: Secondary | ICD-10-CM

## 2023-10-21 DIAGNOSIS — N3289 Other specified disorders of bladder: Secondary | ICD-10-CM

## 2023-10-21 DIAGNOSIS — R52 Pain, unspecified: Secondary | ICD-10-CM | POA: Diagnosis not present

## 2023-10-21 DIAGNOSIS — R509 Fever, unspecified: Secondary | ICD-10-CM

## 2023-10-21 DIAGNOSIS — N390 Urinary tract infection, site not specified: Secondary | ICD-10-CM

## 2023-10-21 LAB — URINALYSIS, COMPLETE
Bilirubin, UA: NEGATIVE
Glucose, UA: NEGATIVE
Ketones, UA: NEGATIVE
Nitrite, UA: NEGATIVE
Protein,UA: NEGATIVE
Specific Gravity, UA: 1.015 (ref 1.005–1.030)
Urobilinogen, Ur: 0.2 mg/dL (ref 0.2–1.0)
pH, UA: 6 (ref 5.0–7.5)

## 2023-10-21 LAB — MICROSCOPIC EXAMINATION: WBC, UA: >30 /[HPF] — AB (ref 0–5)

## 2023-10-21 MED ORDER — CEFDINIR 300 MG PO CAPS
300.0000 mg | ORAL_CAPSULE | Freq: Two times a day (BID) | ORAL | 0 refills | Status: AC
Start: 2023-10-21 — End: 2023-10-31

## 2023-10-21 NOTE — Progress Notes (Signed)
10/21/2023 3:03 PM   Jody Brewer 04-May-1951 [id number]  CC: Chief Complaint  Patient presents with   Follow-up   HPI: Jody Brewer is a 72 y.o. female with a recent history of recurrent versus persistent UTI after undergoing appendectomy in June now on vaginal estrogen cream and daily Macrobid who presents today for evaluation of possible breakthrough UTI.   Today she reports a 10-day history of bladder spasms, irritation, body aches, and low-grade fevers occurring overnight, Tmax 101 F.  She developed acute appendicitis in June and underwent appendectomy.  She reports persistent bladder symptoms thereafter with her symptoms never fully resolving until she was started on daily Macrobid.  She was doing well until her symptoms recurred 10 days ago.  She denies fecaluria and pneumaturia.  She has also noticed recent hair loss, which Dr. Hyacinth Meeker felt was consistent with telogen effluvium following appendicitis.  She has also noticed breast growth on p.o. and vaginal estrogen but is up-to-date with mammography.  In-office UA today positive for trace intact blood and 1+ leukocytes; urine microscopy with >30 WBCs/HPF and many bacteria.  PMH: Past Medical History:  Diagnosis Date   Asthma    COVID-19 11/2019   hospitalized x2   GERD (gastroesophageal reflux disease)    Headache    Hypertension    Sleep apnea     Surgical History: Past Surgical History:  Procedure Laterality Date   ABDOMINAL HYSTERECTOMY     APPENDECTOMY     CATARACT EXTRACTION W/PHACO Left 06/10/2021   Procedure: CATARACT EXTRACTION PHACO AND INTRAOCULAR LENS PLACEMENT (IOC) LEFT;  Surgeon: Lockie Mola, MD;  Location: [facility];  Service: Ophthalmology;  Laterality: Left;  5.69 0:53.6 10.6%   CATARACT EXTRACTION W/PHACO Right 06/24/2021   Procedure: CATARACT EXTRACTION PHACO AND INTRAOCULAR LENS PLACEMENT (IOC) RIGHT;  Surgeon: Lockie Mola, MD;  Location: [facility];  Service: Ophthalmology;  Laterality: Right;  3.61 00:52.4   CHOLECYSTECTOMY     ELBOW BURSA SURGERY Right    FRENULECTOMY, LINGUAL     and removal of portion of soft palate   LUMBAR DISC SURGERY  1996   L3-L5   LUMBAR DISC SURGERY  2004   LUMBAR FUSION  2006   bone harvest rods (2) and screws (36)   LUMBAR LAMINECTOMY  1998   L3-L5   MAXILLARY ANTROSTOMY Right 08/12/2022   Procedure: MAXILLARY ANTROSTOMY REVISION;  Surgeon: Vernie Murders, MD;  Location: [facility];  Service: ENT;  Laterality: Right;  placed disk on OR charge nurse desk 8-8 kp   OOPHORECTOMY     1 ovary removed   TOTAL KNEE ARTHROPLASTY Right 11/17/2021   Procedure: TOTAL KNEE ARTHROPLASTY;  Surgeon: [name], MD;  Location: [facility];  Service: Orthopedics;  Laterality: Right;   UVULOPALATOPHARYNGOPLASTY  2001   XI ROBOTIC LAPAROSCOPIC ASSISTED APPENDECTOMY N/A 05/31/2023   Procedure: XI ROBOTIC LAPAROSCOPIC ASSISTED APPENDECTOMY;  Surgeon: Henrene Dodge, MD;  Location: [facility];  Service: General;  Laterality: N/A;    Home Medications:  Allergies as of 10/21/2023       Reactions   Nirmatrelvir-ritonavir Nausea Only   " Severe nausea and vomiting."   Atorvastatin Palpitations, Other (See Comments)   Muscle pain and severe twitching   Meloxicam Rash   Tolerates ibuprofen        Medication List        Accurate as of October 21, 2023  3:03 PM. If you have any questions, ask your nurse or doctor.  cefdinir 300 MG capsule Commonly known as: OMNICEF Take 1 capsule (300 mg total) by mouth 2 (two) times daily for 10 days. Started by: Carman Ching   Dialyvite Vitamin D 5000 125 MCG (5000 UT) capsule Generic drug: Cholecalciferol Take 5,000 Units by mouth daily.   estradiol 0.1 MG/GM vaginal cream Commonly known as: ESTRACE Estrogen Cream Instruction Discard applicator Apply pea sized amount to tip of finger to urethra before bed. Wash hands well after application.  Use Monday, Wednesday and Friday   estradiol 0.5 MG tablet Commonly known as: ESTRACE Take 0.5 mg by mouth daily.   ferrous sulfate 325 (65 FE) MG EC tablet Take 325 mg by mouth 2 (two) times a week.   gabapentin 300 MG capsule Commonly known as: NEURONTIN Take 300 mg by mouth at bedtime.   ibuprofen 800 MG tablet Commonly known as: ADVIL Take 1 tablet by mouth 2 (two) times daily as needed.   minoxidil 2.5 MG tablet Commonly known as: LONITEN Take by mouth.   montelukast 10 MG tablet Commonly known as: SINGULAIR Take 10 mg by mouth daily.   Multi-Vitamin tablet Take 1 tablet by mouth daily.   nitrofurantoin (macrocrystal-monohydrate) 100 MG capsule Commonly known as: MACROBID Take 1 capsule (100 mg total) by mouth daily.   nortriptyline 10 MG capsule Commonly known as: PAMELOR Take 20 mg by mouth at bedtime.   olmesartan-hydrochlorothiazide 20-12.5 MG tablet Commonly known as: BENICAR HCT Take 1 tablet by mouth daily.   pantoprazole 40 MG tablet Commonly known as: PROTONIX Take 40 mg by mouth 2 (two) times daily.   venlafaxine XR 150 MG 24 hr capsule Commonly known as: EFFEXOR-XR Take 150 mg by mouth daily with breakfast.   vitamin E 180 MG (400 UNITS) capsule Take 400 Units by mouth 2 (two) times daily.   zinc sulfate 220 (50 Zn) MG capsule Take 220 mg by mouth daily.        Allergies:  Allergies  Allergen Reactions   Nirmatrelvir-Ritonavir Nausea Only    " Severe nausea and vomiting."   Atorvastatin Palpitations and Other (See Comments)    Muscle pain and severe twitching   Meloxicam Rash    Tolerates ibuprofen    Family History: Family History  Problem Relation Age of Onset   Emphysema Mother    Parkinson's disease Father    Dementia Father    Hypertension Father    Breast cancer Neg Hx     Social History:   reports that she has never smoked. She has never been exposed to tobacco smoke. She has never used smokeless tobacco. She  reports that she does not currently use alcohol after a past usage of about 3.0 standard drinks of alcohol per week. She reports that she does not currently use drugs.  Physical Exam: BP 118/74   Pulse 98   Ht 5\' 5"  (1.651 m)   Wt 168 lb (76.2 kg)   BMI 27.96 kg/m   Constitutional:  Alert and oriented, no acute distress, nontoxic appearing HEENT: [place], AT Cardiovascular: No clubbing, cyanosis, or edema Respiratory: Normal respiratory effort, no increased work of breathing Skin: No rashes, bruises or suspicious lesions Neurologic: Grossly intact, no focal deficits, moving all 4 extremities Psychiatric: Normal mood and affect  Laboratory Data: Results for orders placed or performed in visit on 10/21/23  Microscopic Examination   Urine  Result Value Ref Range   WBC, UA >30 (A) 0 - 5 /hpf   RBC, Urine 0-2 0 - 2 /hpf  Epithelial Cells (non renal) 0-10 0 - 10 /hpf   Bacteria, UA Many (A) None seen/Few  Urinalysis, Complete  Result Value Ref Range   Specific Gravity, UA 1.015 1.005 - 1.030   pH, UA 6.0 5.0 - 7.5   Color, UA Yellow Yellow   Appearance Ur Cloudy (A) Clear   Leukocytes,UA 1+ (A) Negative   Protein,UA Negative Negative/Trace   Glucose, UA Negative Negative   Ketones, UA Negative Negative   RBC, UA Trace (A) Negative   Bilirubin, UA Negative Negative   Urobilinogen, Ur 0.2 0.2 - 1.0 mg/dL   Nitrite, UA Negative Negative   Microscopic Examination See below:    Assessment & Plan:   1. Recurrent UTI UA appears grossly infected today, will start empiric Omnicef and send for culture for further evaluation.  With breakthrough infection despite suppressive antibiotics, I recommended pursuing cystoscopy with Dr. Richardo Hanks.  Will defer to him on consideration of possible pelvic imaging to rule out underlying fistula given recent pelvic surgery. - Urinalysis, Complete - CULTURE, URINE COMPREHENSIVE - cefdinir (OMNICEF) 300 MG capsule; Take 1 capsule (300 mg total) by mouth 2  (two) times daily for 10 days.  Dispense: 20 capsule; Refill: 0  Return in about 2 weeks (around 11/04/2023) for Cysto with Dr. Richardo Hanks.  Carman Ching, PA-C  [facility] [place] [address] [place], [place] [address] [phone]

## 2023-10-27 LAB — CULTURE, URINE COMPREHENSIVE

## 2023-11-02 ENCOUNTER — Encounter: Payer: Self-pay | Admitting: Urology

## 2023-11-02 ENCOUNTER — Ambulatory Visit: Payer: Medicare Other | Admitting: Urology

## 2023-11-02 VITALS — BP 113/66 | HR 93 | Ht 65.0 in | Wt 174.0 lb

## 2023-11-02 DIAGNOSIS — N390 Urinary tract infection, site not specified: Secondary | ICD-10-CM

## 2023-11-02 DIAGNOSIS — Z87440 Personal history of urinary (tract) infections: Secondary | ICD-10-CM

## 2023-11-02 NOTE — Progress Notes (Signed)
Cystoscopy Procedure Note:  Indication: Recurrent UTI  After informed consent and discussion of the procedure and its risks, Jody Brewer was positioned and prepped in the standard fashion. Cystoscopy was performed with a flexible cystoscope. The urethra, bladder neck and entire bladder was visualized in a standard fashion.  The ureteral orifices were visualized in their normal location and orientation.  Bladder mucosa grossly normal throughout, no abnormalities on retroflexion.  Imaging: CT abdomen and pelvis with contrast from July 2024 with no urologic abnormalities  Findings: Normal cystoscopy  Assessment and Plan: -She was started on topical estrogen cream, cranberry tablet prophylaxis, and nitrofurantoin prophylaxis x 90 days on 08/30/2023 -Was initially doing well,, Developed developed symptoms of bladder spasm, pel with discomfort, body ache, and fever of 101 on 10/21/2023, urine culture grew Klebsiella, and she was treated with antibiotics by Carman Ching, PA.  Klebsiella was resistant to the nitrofurantoin prophylaxis she was on.  Reassurance was again provided regarding normal CT scan and cystoscopy, still suspect genitourinary syndrome of menopause as primary etiology of recurrent infections and urinary symptoms.  Can continue the topical estrogen cream 3 times weekly.  We had a very long conversation today about the difference between asymptomatic bacteriuria and true UTI  Keep follow-up as scheduled next month  Legrand Rams, MD 11/02/2023

## 2023-11-02 NOTE — Patient Instructions (Signed)
You can use the topical estrogen cream 3 times weekly.  It is normal to have bacteria in the urine(asymptomatic bacteriuria), it is only an infection/UTI if you have symptoms like burning, pelvic pain, urgency/frequency.  Continue to take cranberry tablets twice daily.  Asymptomatic Bacteriuria Asymptomatic bacteriuria is the presence of a large number of bacteria in the urine without the usual symptoms of burning or frequent urination. This is usually not harmful, and treatment may not be needed. A person with this condition will not be more likely to develop an infection in the future. What are the causes? This condition is caused by an increase in bacteria in the urine. This increase can be caused by: Bacteria entering the urinary tract, such as during sex. A blockage in the urinary tract, such as from kidney stones or a tumor. Bladder problems that prevent the bladder from emptying. What increases the risk? You are more likely to develop this condition if: You have diabetes. You are an older adult. This especially affects older adults in long-term care facilities. You are pregnant and in the first trimester. You have kidney stones. You are female. You have had a kidney transplant. You have a leaky kidney tube valve (reflux). You had a urinary catheter for a long period of time. This is a long, thin tube that collects urine. What are the signs or symptoms? There are no symptoms of this condition. How is this diagnosed? This condition is diagnosed with a urine test. Because this condition does not cause symptoms, it is usually diagnosed when a urine sample is taken to treat or diagnose another condition, such as pregnancy or kidney problems. Most women who are in their first trimester of pregnancy are screened for asymptomatic bacteriuria. How is this treated? Usually, treatment is not needed for this condition. Treating the condition can lead to other problems, such as a yeast infection or  the growth of bacteria that do not respond to treatment (antibiotic-resistant bacteria). Some people do need treatment with antibiotic medicines to prevent kidney infection, known as pyelonephritis. Treatment is needed if: You are pregnant. In pregnant women, kidney infection can lead to: Early labor (premature labor). Very low birth weight (fetal growth restriction). Newborn death. You are having a procedure that affects the urinary tract. You have had a kidney transplant. If you are diagnosed with this condition, talk with your health care provider about any concerns that you have. Follow these instructions at home: Medicines Take over-the-counter and prescription medicines only as told by your health care provider. If you were prescribed an antibiotic medicine, take it as told by your health care provider. Do not stop using the antibiotic even if you start to feel better. General instructions Monitor your condition for any changes. Drink enough fluid to keep your urine pale yellow. Urinate more often to keep your bladder empty. If you are female, keep the area around your vagina and rectum clean. Wipe from front to back after urinating or having a bowel movement. Use each piece of toilet paper only once. Keep all follow-up visits. This is important. Contact a health care provider if: You have symptoms of a urine infection, such as: A burning sensation, or pain when you urinate. A strong need to urinate, or urinating more often. Urine turning discolored or cloudy. Blood in your urine. Urine that smells bad. Get help right away if: You develop signs of a kidney infection, such as: Back pain or pelvic pain. A fever or chills. Nausea or vomiting. Severe  pain that cannot be controlled with medicine. Summary Asymptomatic bacteriuria is the presence of a large number of bacteria in the urine without the usual symptoms of burning or frequent urination. Usually, treatment is not needed  for this condition. Treating the condition can lead to other problems, such as a yeast infection or the growth of bacteria that do not respond to treatment. Some people do need treatment. Treatment is needed if you are pregnant, if you are having a procedure that affects the urinary tract, or if you have had a kidney transplant. If you were prescribed an antibiotic medicine, take it as told by your health care provider. Do not stop using the antibiotic even if you start to feel better. This information is not intended to replace advice given to you by your health care provider. Make sure you discuss any questions you have with your health care provider. Document Revised: 07/20/2020 Document Reviewed: 07/25/2020 Elsevier Patient Education  2024 [employer].

## 2023-11-15 DIAGNOSIS — N390 Urinary tract infection, site not specified: Secondary | ICD-10-CM | POA: Diagnosis present

## 2023-11-29 ENCOUNTER — Ambulatory Visit: Payer: Medicare Other | Admitting: Urology

## 2023-12-13 NOTE — Progress Notes (Unsigned)
12/14/2023 2:57 PM   Jody Brewer 02/02/1951 [id number]  Referring provider: Danella Penton, MD [phone] [facility] [address] [facility] [place],  [place] [address]  Urological history: 1. rUTI's -contributing factors of age, GSM and IBS -contrast CT (06/2023) - no nidus for infection -cysto (10/2023) - NED  -documented urine cultures over the last year   October 21, 2023 - Klebsiella pneumoniae  August 09, 2023 - E.coli  July 69,6295 - No growth  July 19, 2023 - E.coli  July 15, 2023 - E.coli  June 28, 2023 - <10,000 colonies  June 13, 2023 - E.coli -vaginal estrogen cream applied three nights weekly   Chief Complaint  Patient presents with   Urinary Tract Infection   HPI: Jody Brewer is a 72 y.o. female who presents today for frequency, dysuria, bladder spasm and a temp of 100.3.  Previous records reviewed.   She reached out on 12/16/204 via MyChart for symptoms of frequency (getting up at night ~ 3 times), mild burning with urination, moderate bladder spasms and temp of 100.3.    She states the fevers are only occurring at night.  She is febrile this afternoon.  She continues to have frequency, urgency, nocturia x 3-4 which is unusual for her.  And she has a cramping feeling at the end of her voiding stream.  She is using the vaginal estrogen cream 3 nights weekly.  Patient denies any modifying or aggravating factors.  Patient denies any recent UTI's, dysuria or flank pain.  Patient denies any chills, nausea or vomiting.    UA greater than 30 WBCs, 3-10 RBCs and many bacteria.  PVR 0 mL   PMH: Past Medical History:  Diagnosis Date   Asthma    COVID-19 11/2019   hospitalized x2   GERD (gastroesophageal reflux disease)    Headache    Hypertension    Sleep apnea     Surgical History: Past Surgical History:  Procedure Laterality Date   ABDOMINAL HYSTERECTOMY     APPENDECTOMY     CATARACT EXTRACTION W/PHACO Left 06/10/2021    Procedure: CATARACT EXTRACTION PHACO AND INTRAOCULAR LENS PLACEMENT (IOC) LEFT;  Surgeon: Lockie Mola, MD;  Location: [facility];  Service: Ophthalmology;  Laterality: Left;  5.69 0:53.6 10.6%   CATARACT EXTRACTION W/PHACO Right 06/24/2021   Procedure: CATARACT EXTRACTION PHACO AND INTRAOCULAR LENS PLACEMENT (IOC) RIGHT;  Surgeon: Lockie Mola, MD;  Location: [facility];  Service: Ophthalmology;  Laterality: Right;  3.61 00:52.4   CHOLECYSTECTOMY     ELBOW BURSA SURGERY Right    FRENULECTOMY, LINGUAL     and removal of portion of soft palate   LUMBAR DISC SURGERY  1996   L3-L5   LUMBAR DISC SURGERY  2004   LUMBAR FUSION  2006   bone harvest rods (2) and screws (36)   LUMBAR LAMINECTOMY  1998   L3-L5   MAXILLARY ANTROSTOMY Right 08/12/2022   Procedure: MAXILLARY ANTROSTOMY REVISION;  Surgeon: Vernie Murders, MD;  Location: [facility];  Service: ENT;  Laterality: Right;  placed disk on OR charge nurse desk 8-8 kp   OOPHORECTOMY     1 ovary removed   TOTAL KNEE ARTHROPLASTY Right 11/17/2021   Procedure: TOTAL KNEE ARTHROPLASTY;  Surgeon: [name], MD;  Location: [facility];  Service: Orthopedics;  Laterality: Right;   UVULOPALATOPHARYNGOPLASTY  2001   XI ROBOTIC LAPAROSCOPIC ASSISTED APPENDECTOMY N/A 05/31/2023   Procedure: XI ROBOTIC LAPAROSCOPIC ASSISTED APPENDECTOMY;  Surgeon: Aleen Campi,  Elita Quick, MD;  Location: [facility];  Service: General;  Laterality: N/A;    Home Medications:  Allergies as of 12/14/2023       Reactions   Nirmatrelvir-ritonavir Nausea Only   " Severe nausea and vomiting."   Atorvastatin Palpitations, Other (See Comments)   Muscle pain and severe twitching   Meloxicam Rash   Tolerates ibuprofen        Medication List        Accurate as of December 14, 2023  2:57 PM. If you have any questions, ask your nurse or doctor.          amoxicillin-clavulanate 875-125 MG tablet Commonly known as: AUGMENTIN Take 1  tablet by mouth every 12 (twelve) hours. Started by: Michiel Cowboy   Dialyvite Vitamin D 5000 125 MCG (5000 UT) capsule Generic drug: Cholecalciferol Take 5,000 Units by mouth daily.   estradiol 0.1 MG/GM vaginal cream Commonly known as: ESTRACE Estrogen Cream Instruction Discard applicator Apply pea sized amount to tip of finger to urethra before bed. Wash hands well after application. Use Monday, Wednesday and Friday   estradiol 0.5 MG tablet Commonly known as: ESTRACE Take 0.5 mg by mouth daily.   ferrous sulfate 325 (65 FE) MG EC tablet Take 325 mg by mouth 2 (two) times a week.   fluconazole 150 MG tablet Commonly known as: DIFLUCAN Take 1 tablet (150 mg total) by mouth once for 1 dose. Started by: Michiel Cowboy   gabapentin 300 MG capsule Commonly known as: NEURONTIN Take 300 mg by mouth at bedtime.   ibuprofen 800 MG tablet Commonly known as: ADVIL Take 1 tablet by mouth 2 (two) times daily as needed.   minoxidil 2.5 MG tablet Commonly known as: LONITEN Take by mouth.   montelukast 10 MG tablet Commonly known as: SINGULAIR Take 10 mg by mouth daily.   Multi-Vitamin tablet Take 1 tablet by mouth daily.   nitrofurantoin (macrocrystal-monohydrate) 100 MG capsule Commonly known as: MACROBID Take 1 capsule (100 mg total) by mouth daily.   nortriptyline 10 MG capsule Commonly known as: PAMELOR Take 20 mg by mouth at bedtime.   olmesartan-hydrochlorothiazide 20-12.5 MG tablet Commonly known as: BENICAR HCT Take 1 tablet by mouth daily.   pantoprazole 40 MG tablet Commonly known as: PROTONIX Take 40 mg by mouth 2 (two) times daily.   venlafaxine XR 150 MG 24 hr capsule Commonly known as: EFFEXOR-XR Take 150 mg by mouth daily with breakfast.   vitamin E 180 MG (400 UNITS) capsule Take 400 Units by mouth 2 (two) times daily.   zinc sulfate (50mg  elemental zinc) 220 (50 Zn) MG capsule Take 220 mg by mouth daily.        Allergies:  Allergies   Allergen Reactions   Nirmatrelvir-Ritonavir Nausea Only    " Severe nausea and vomiting."   Atorvastatin Palpitations and Other (See Comments)    Muscle pain and severe twitching   Meloxicam Rash    Tolerates ibuprofen    Family History: Family History  Problem Relation Age of Onset   Emphysema Mother    Parkinson's disease Father    Dementia Father    Hypertension Father    Breast cancer Neg Hx     Social History:  reports that she has never smoked. She has never been exposed to tobacco smoke. She has never used smokeless tobacco. She reports that she does not currently use alcohol after a past usage of about 3.0 standard drinks of alcohol per week. She reports that  she does not currently use drugs.  ROS: Pertinent ROS in HPI  Physical Exam: BP 102/65   Pulse 88   Temp 98.1 F (36.7 C)   Ht 5\' 5"  (1.651 m)   Wt 170 lb (77.1 kg)   BMI 28.29 kg/m   Constitutional:  Well nourished. Alert and oriented, No acute distress. HEENT: [place] AT, moist mucus membranes.  Trachea midline Cardiovascular: No clubbing, cyanosis, or edema. Respiratory: Normal respiratory effort, no increased work of breathing. Neurologic: Grossly intact, no focal deficits, moving all 4 extremities. Psychiatric: Normal mood and affect.    Laboratory Data: CBC w/auto Differential (5 Part) Order: [id number] Component Ref Range & Units 4 mo ago  WBC (White Blood Cell Count) 4.1 - 10.2 10^3/uL 6.2  RBC (Red Blood Cell Count) 4.04 - 5.48 10^6/uL 4.36  Hemoglobin 12.0 - 15.0 gm/dL 11.9  Hematocrit 14.7 - 47.0 % 40.8  MCV (Mean Corpuscular Volume) 80.0 - 100.0 fl 93.6  MCH (Mean Corpuscular Hemoglobin) 27.0 - 31.2 pg 31.0  MCHC (Mean Corpuscular Hemoglobin Concentration) 32.0 - 36.0 gm/dL 82.9  Platelet Count 562 - 450 10^3/uL 314  RDW-CV (Red Cell Distribution Width) 11.6 - 14.8 % 13.9  MPV (Mean Platelet Volume) 9.4 - 12.4 fl 8.0 Low   Neutrophils 1.50 - 7.80 10^3/uL 2.68  Lymphocytes 1.00 -  3.60 10^3/uL 2.72  Monocytes 0.00 - 1.50 10^3/uL 0.63  Eosinophils 0.00 - 0.55 10^3/uL 0.12  Basophils 0.00 - 0.09 10^3/uL 0.05  Neutrophil % 32.0 - 70.0 % 43.2  Lymphocyte % 10.0 - 50.0 % 43.7  Monocyte % 4.0 - 13.0 % 10.1  Eosinophil % 1.0 - 5.0 % 1.9  Basophil% 0.0 - 2.0 % 0.8  Immature Granulocyte % <=0.7 % 0.3  Immature Granulocyte Count <=0.06 10^3/L 0.02  Resulting Agency [facility]   Specimen Collected: 08/11/23 12:15   Performed by: [facility] Last Resulted: 08/11/23 12:22  Received From: [facility]  Result Received: 08/11/23 15:51   Comprehensive Metabolic Panel (CMP) Order: [id number] Component Ref Range & Units 4 mo ago  Glucose 70 - 110 mg/dL 93  Sodium 696 - 295 mmol/L 140  Potassium 3.6 - 5.1 mmol/L 3.8  Chloride 97 - 109 mmol/L 105  Carbon Dioxide (CO2) 22.0 - 32.0 mmol/L 30.6  Urea Nitrogen (BUN) 7 - 25 mg/dL 16  Creatinine 0.6 - 1.1 mg/dL 0.9  Glomerular Filtration Rate (eGFR) >60 mL/min/1.73sq m 69  Comment: CKD-EPI (2021) does not include patient's race in the calculation of eGFR.  Monitoring changes of plasma creatinine and eGFR over time is useful for monitoring kidney function.  Interpretive Ranges for eGFR (CKD-EPI 2021):  eGFR:       >60 mL/min/1.73 sq. m - Normal eGFR:       30-59 mL/min/1.73 sq. m - Moderately Decreased eGFR:       15-29 mL/min/1.73 sq. m  - Severely Decreased eGFR:       < 15 mL/min/1.73 sq. m  - Kidney Failure   Note: These eGFR calculations do not apply in acute situations when eGFR is changing rapidly or patients on dialysis.  Calcium 8.7 - 10.3 mg/dL 9.5  AST 8 - 39 U/L 20  ALT 5 - 38 U/L 19  Alk Phos (alkaline Phosphatase) 34 - 104 U/L 90  Albumin 3.5 - 4.8 g/dL 4.4  Bilirubin, Total 0.3 - 1.2 mg/dL 0.5  Protein, Total 6.1 - 7.9 g/dL 7.3  A/G Ratio 1.0 - 5.0 gm/dL  1.5  Resulting Agency [facility] - [facility]   Specimen Collected: 08/11/23  12:15   Performed by: [facility] Last Resulted: 08/11/23 12:41  Received From: [facility]  Result Received: 08/11/23 15:51   Urinalysis See EPIC and HPI  I have reviewed the labs.   Pertinent Imaging: Bladder Scan (Post Void Residual) in office Order: [id number]  Status: Final result     Next appt: 01/12/2024 at 11:15 AM in Urology Sondra Come, MD)     Dx: Recurrent UTI   Test Result Released: No (scheduled for 12/14/2023  3:21 PM)   0 Result Notes    Component Ref Range & Units (hover) 14:21  PVR 0.0         Last Resulted: 12/14/23 14:21    Assessment & Plan:    1. Suspected UTI/rUTI's -UA grossly infected  -Urine culture pending -Started empirically on Augmentin 875/125, will adjust if necessary once urine culture and sensitivity results are available    2. GSM -continue applying vaginal estrogen cream three nights weekly   3. Microscopic hematuria -UA with micro heme, likely due to UTI  Return for Keep follow up with Dr. Richardo Hanks in January .  These notes generated with voice recognition software. I apologize for typographical errors.  Cloretta Ned  [facility] [address]  [address] [place], [place] [address] [phone]

## 2023-12-14 ENCOUNTER — Encounter: Payer: Self-pay | Admitting: Urology

## 2023-12-14 ENCOUNTER — Ambulatory Visit: Payer: Medicare Other | Admitting: Urology

## 2023-12-14 VITALS — BP 102/65 | HR 88 | Temp 98.1°F | Ht 65.0 in | Wt 170.0 lb

## 2023-12-14 DIAGNOSIS — N952 Postmenopausal atrophic vaginitis: Secondary | ICD-10-CM | POA: Diagnosis not present

## 2023-12-14 DIAGNOSIS — R350 Frequency of micturition: Secondary | ICD-10-CM

## 2023-12-14 DIAGNOSIS — R509 Fever, unspecified: Secondary | ICD-10-CM

## 2023-12-14 DIAGNOSIS — N3289 Other specified disorders of bladder: Secondary | ICD-10-CM | POA: Diagnosis not present

## 2023-12-14 DIAGNOSIS — R3129 Other microscopic hematuria: Secondary | ICD-10-CM

## 2023-12-14 DIAGNOSIS — R300 Dysuria: Secondary | ICD-10-CM

## 2023-12-14 DIAGNOSIS — N390 Urinary tract infection, site not specified: Secondary | ICD-10-CM

## 2023-12-14 LAB — URINALYSIS, COMPLETE
Bilirubin, UA: NEGATIVE
Glucose, UA: NEGATIVE
Ketones, UA: NEGATIVE
Nitrite, UA: NEGATIVE
Specific Gravity, UA: 1.02 (ref 1.005–1.030)
Urobilinogen, Ur: 0.2 mg/dL (ref 0.2–1.0)
pH, UA: 7 (ref 5.0–7.5)

## 2023-12-14 LAB — MICROSCOPIC EXAMINATION: WBC, UA: >30 /[HPF] — AB (ref 0–5)

## 2023-12-14 LAB — BLADDER SCAN AMB NON-IMAGING: PVR: 0 WU

## 2023-12-14 MED ORDER — AMOXICILLIN-POT CLAVULANATE 875-125 MG PO TABS: 1.0000 | ORAL_TABLET | Freq: Two times a day (BID) | ORAL | 0 refills | Status: DC

## 2023-12-14 MED ORDER — FLUCONAZOLE 150 MG PO TABS: 150.0000 mg | ORAL_TABLET | Freq: Once | ORAL | 0 refills | Status: AC

## 2023-12-14 MED ORDER — CEFTRIAXONE SODIUM 1 G IJ SOLR
1.0000 g | Freq: Once | INTRAMUSCULAR | Status: AC
Start: 2023-12-14 — End: 2023-12-14
  Administered 2023-12-14: 1 g via INTRAMUSCULAR

## 2023-12-14 NOTE — Addendum Note (Signed)
Addended by: Winn Jock on: 12/14/2023 03:07 PM   Modules accepted: Orders

## 2023-12-17 LAB — CULTURE, URINE COMPREHENSIVE

## 2024-01-02 ENCOUNTER — Emergency Department: Payer: Medicare Other

## 2024-01-02 ENCOUNTER — Other Ambulatory Visit: Payer: Self-pay

## 2024-01-02 ENCOUNTER — Inpatient Hospital Stay: Payer: Medicare Other

## 2024-01-02 ENCOUNTER — Inpatient Hospital Stay
Admission: EM | Admit: 2024-01-02 | Discharge: 2024-01-09 | DRG: 872 | Disposition: A | Discharge status: Home / Self Care | Payer: Medicare Other | Attending: Internal Medicine | Admitting: Internal Medicine

## 2024-01-02 DIAGNOSIS — Z7722 Contact with and (suspected) exposure to environmental tobacco smoke (acute) (chronic): Secondary | ICD-10-CM | POA: Diagnosis present

## 2024-01-02 DIAGNOSIS — J8489 Other specified interstitial pulmonary diseases: Secondary | ICD-10-CM | POA: Diagnosis present

## 2024-01-02 DIAGNOSIS — Z8249 Family history of ischemic heart disease and other diseases of the circulatory system: Secondary | ICD-10-CM

## 2024-01-02 DIAGNOSIS — Z8616 Personal history of COVID-19: Secondary | ICD-10-CM | POA: Diagnosis not present

## 2024-01-02 DIAGNOSIS — K449 Diaphragmatic hernia without obstruction or gangrene: Secondary | ICD-10-CM | POA: Diagnosis present

## 2024-01-02 DIAGNOSIS — N390 Urinary tract infection, site not specified: Secondary | ICD-10-CM | POA: Diagnosis present

## 2024-01-02 DIAGNOSIS — J069 Acute upper respiratory infection, unspecified: Secondary | ICD-10-CM | POA: Diagnosis present

## 2024-01-02 DIAGNOSIS — Z9841 Cataract extraction status, right eye: Secondary | ICD-10-CM

## 2024-01-02 DIAGNOSIS — Z888 Allergy status to other drugs, medicaments and biological substances status: Secondary | ICD-10-CM | POA: Diagnosis not present

## 2024-01-02 DIAGNOSIS — Z981 Arthrodesis status: Secondary | ICD-10-CM

## 2024-01-02 DIAGNOSIS — N179 Acute kidney failure, unspecified: Secondary | ICD-10-CM | POA: Diagnosis present

## 2024-01-02 DIAGNOSIS — R0789 Other chest pain: Secondary | ICD-10-CM | POA: Diagnosis not present

## 2024-01-02 DIAGNOSIS — E860 Dehydration: Secondary | ICD-10-CM | POA: Diagnosis present

## 2024-01-02 DIAGNOSIS — Z87440 Personal history of urinary (tract) infections: Secondary | ICD-10-CM | POA: Diagnosis not present

## 2024-01-02 DIAGNOSIS — F419 Anxiety disorder, unspecified: Secondary | ICD-10-CM | POA: Diagnosis present

## 2024-01-02 DIAGNOSIS — G4700 Insomnia, unspecified: Secondary | ICD-10-CM | POA: Diagnosis present

## 2024-01-02 DIAGNOSIS — R509 Fever, unspecified: Secondary | ICD-10-CM | POA: Diagnosis not present

## 2024-01-02 DIAGNOSIS — Z79899 Other long term (current) drug therapy: Secondary | ICD-10-CM

## 2024-01-02 DIAGNOSIS — G4733 Obstructive sleep apnea (adult) (pediatric): Secondary | ICD-10-CM | POA: Diagnosis present

## 2024-01-02 DIAGNOSIS — Z1152 Encounter for screening for COVID-19: Secondary | ICD-10-CM | POA: Diagnosis not present

## 2024-01-02 DIAGNOSIS — Z96651 Presence of right artificial knee joint: Secondary | ICD-10-CM | POA: Diagnosis present

## 2024-01-02 DIAGNOSIS — J45909 Unspecified asthma, uncomplicated: Secondary | ICD-10-CM | POA: Diagnosis present

## 2024-01-02 DIAGNOSIS — R519 Headache, unspecified: Secondary | ICD-10-CM | POA: Diagnosis present

## 2024-01-02 DIAGNOSIS — N939 Abnormal uterine and vaginal bleeding, unspecified: Secondary | ICD-10-CM | POA: Diagnosis not present

## 2024-01-02 DIAGNOSIS — I959 Hypotension, unspecified: Secondary | ICD-10-CM | POA: Diagnosis present

## 2024-01-02 DIAGNOSIS — A419 Sepsis, unspecified organism: Principal | ICD-10-CM | POA: Diagnosis present

## 2024-01-02 DIAGNOSIS — Z9049 Acquired absence of other specified parts of digestive tract: Secondary | ICD-10-CM

## 2024-01-02 DIAGNOSIS — R42 Dizziness and giddiness: Secondary | ICD-10-CM

## 2024-01-02 DIAGNOSIS — R6510 Systemic inflammatory response syndrome (SIRS) of non-infectious origin without acute organ dysfunction: Principal | ICD-10-CM

## 2024-01-02 DIAGNOSIS — Z9842 Cataract extraction status, left eye: Secondary | ICD-10-CM | POA: Diagnosis not present

## 2024-01-02 DIAGNOSIS — E042 Nontoxic multinodular goiter: Secondary | ICD-10-CM | POA: Diagnosis present

## 2024-01-02 DIAGNOSIS — D649 Anemia, unspecified: Secondary | ICD-10-CM | POA: Diagnosis present

## 2024-01-02 DIAGNOSIS — Z820 Family history of epilepsy and other diseases of the nervous system: Secondary | ICD-10-CM

## 2024-01-02 DIAGNOSIS — Z961 Presence of intraocular lens: Secondary | ICD-10-CM | POA: Diagnosis present

## 2024-01-02 DIAGNOSIS — J208 Acute bronchitis due to other specified organisms: Secondary | ICD-10-CM | POA: Diagnosis not present

## 2024-01-02 DIAGNOSIS — Z90721 Acquired absence of ovaries, unilateral: Secondary | ICD-10-CM

## 2024-01-02 DIAGNOSIS — K219 Gastro-esophageal reflux disease without esophagitis: Secondary | ICD-10-CM | POA: Diagnosis present

## 2024-01-02 DIAGNOSIS — Z201 Contact with and (suspected) exposure to tuberculosis: Secondary | ICD-10-CM | POA: Diagnosis present

## 2024-01-02 DIAGNOSIS — I10 Essential (primary) hypertension: Secondary | ICD-10-CM | POA: Diagnosis present

## 2024-01-02 DIAGNOSIS — Z825 Family history of asthma and other chronic lower respiratory diseases: Secondary | ICD-10-CM

## 2024-01-02 DIAGNOSIS — Z90710 Acquired absence of both cervix and uterus: Secondary | ICD-10-CM

## 2024-01-02 HISTORY — DX: Hypotension, unspecified: I95.9

## 2024-01-02 HISTORY — DX: Dizziness and giddiness: R42

## 2024-01-02 LAB — CBC
HCT: 32.1 % — ABNORMAL LOW (ref 36.0–46.0)
Hemoglobin: 11 g/dL — ABNORMAL LOW (ref 12.0–15.0)
MCH: 30.3 pg (ref 26.0–34.0)
MCHC: 34.3 g/dL (ref 30.0–36.0)
MCV: 88.4 fL (ref 80.0–100.0)
Platelets: 289 10*3/uL (ref 150–400)
RBC: 3.63 MIL/uL — ABNORMAL LOW (ref 3.87–5.11)
RDW: 13.7 % (ref 11.5–15.5)
WBC: 10.5 10*3/uL (ref 4.0–10.5)
nRBC: 0 % (ref 0.0–0.2)

## 2024-01-02 LAB — HEPATIC FUNCTION PANEL
ALT: 21 U/L (ref 0–44)
AST: 30 U/L (ref 15–41)
Albumin: 4.1 g/dL (ref 3.5–5.0)
Alkaline Phosphatase: 68 U/L (ref 38–126)
Bilirubin, Direct: 0.2 mg/dL (ref 0.0–0.2)
Indirect Bilirubin: 1 mg/dL — ABNORMAL HIGH (ref 0.3–0.9)
Total Bilirubin: 1.2 mg/dL (ref 0.0–1.2)
Total Protein: 7.5 g/dL (ref 6.5–8.1)

## 2024-01-02 LAB — TYPE AND SCREEN
ABO/RH(D): O POS
Antibody Screen: NEGATIVE

## 2024-01-02 LAB — BASIC METABOLIC PANEL
Anion gap: 12 (ref 5–15)
BUN: 22 mg/dL (ref 8–23)
CO2: 24 mmol/L (ref 22–32)
Calcium: 9.2 mg/dL (ref 8.9–10.3)
Chloride: 102 mmol/L (ref 98–111)
Creatinine, Ser: 1.33 mg/dL — ABNORMAL HIGH (ref 0.44–1.00)
GFR, Estimated: 43 mL/min — ABNORMAL LOW (ref >60)
Glucose, Bld: 101 mg/dL — ABNORMAL HIGH (ref 70–99)
Potassium: 3.5 mmol/L (ref 3.5–5.1)
Sodium: 138 mmol/L (ref 135–145)

## 2024-01-02 LAB — URINALYSIS, ROUTINE W REFLEX MICROSCOPIC
Bilirubin Urine: NEGATIVE
Glucose, UA: NEGATIVE mg/dL
Hgb urine dipstick: NEGATIVE
Ketones, ur: NEGATIVE mg/dL
Leukocytes,Ua: NEGATIVE
Nitrite: NEGATIVE
Protein, ur: NEGATIVE mg/dL
Specific Gravity, Urine: 1.014 (ref 1.005–1.030)
pH: 5 (ref 5.0–8.0)

## 2024-01-02 LAB — TSH: TSH: 2.235 u[IU]/mL (ref 0.350–4.500)

## 2024-01-02 LAB — RESP PANEL BY RT-PCR (RSV, FLU A&B, COVID)  RVPGX2
Influenza A by PCR: NEGATIVE
Influenza B by PCR: NEGATIVE
Resp Syncytial Virus by PCR: NEGATIVE
SARS Coronavirus 2 by RT PCR: NEGATIVE

## 2024-01-02 LAB — MAGNESIUM: Magnesium: 2.3 mg/dL (ref 1.7–2.4)

## 2024-01-02 LAB — LIPASE, BLOOD: Lipase: 27 U/L (ref 11–51)

## 2024-01-02 LAB — T4, FREE: Free T4: 0.81 ng/dL (ref 0.61–1.12)

## 2024-01-02 LAB — LACTIC ACID, PLASMA: Lactic Acid, Venous: 1.2 mmol/L (ref 0.5–1.9)

## 2024-01-02 LAB — D-DIMER, QUANTITATIVE: D-Dimer, Quant: 0.43 ug{FEU}/mL (ref 0.00–0.50)

## 2024-01-02 LAB — PROCALCITONIN: Procalcitonin: 0.15 ng/mL

## 2024-01-02 LAB — TROPONIN I (HIGH SENSITIVITY): Troponin I (High Sensitivity): 5 ng/L (ref <18)

## 2024-01-02 MED ORDER — ACETAMINOPHEN 325 MG PO TABS
650.0000 mg | ORAL_TABLET | Freq: Four times a day (QID) | ORAL | Status: DC | PRN
  Administered 2024-01-03 – 2024-01-05 (×4): 650 mg via ORAL
  Filled 2024-01-02 (×4): qty 2

## 2024-01-02 MED ORDER — ACETAMINOPHEN 650 MG RE SUPP: 650.0000 mg | Freq: Four times a day (QID) | RECTAL | Status: DC | PRN

## 2024-01-02 MED ORDER — SODIUM CHLORIDE 0.9 % IV SOLN
1.0000 g | INTRAVENOUS | Status: AC
  Administered 2024-01-02: 1 g via INTRAVENOUS
  Filled 2024-01-02: qty 10

## 2024-01-02 MED ORDER — SODIUM CHLORIDE 0.9 % IV BOLUS
500.0000 mL | Freq: Once | INTRAVENOUS | Status: AC
  Administered 2024-01-02: 500 mL via INTRAVENOUS

## 2024-01-02 MED ORDER — ONDANSETRON 4 MG PO TBDP
4.0000 mg | ORAL_TABLET | Freq: Once | ORAL | Status: AC
  Administered 2024-01-02: 4 mg via ORAL
  Filled 2024-01-02: qty 1

## 2024-01-02 MED ORDER — VENLAFAXINE HCL ER 75 MG PO CP24
150.0000 mg | ORAL_CAPSULE | Freq: Every day | ORAL | Status: DC
  Administered 2024-01-03 – 2024-01-09 (×7): 150 mg via ORAL
  Filled 2024-01-02 (×6): qty 2
  Filled 2024-01-02: qty 1

## 2024-01-02 MED ORDER — PANTOPRAZOLE SODIUM 40 MG IV SOLR
40.0000 mg | Freq: Two times a day (BID) | INTRAVENOUS | Status: DC
  Administered 2024-01-02 – 2024-01-06 (×8): 40 mg via INTRAVENOUS
  Filled 2024-01-02 (×8): qty 10

## 2024-01-02 MED ORDER — ONDANSETRON HCL 4 MG/2ML IJ SOLN
4.0000 mg | INTRAMUSCULAR | Status: AC
  Administered 2024-01-02: 4 mg via INTRAVENOUS
  Filled 2024-01-02: qty 2

## 2024-01-02 MED ORDER — ACETAMINOPHEN 10 MG/ML IV SOLN
1000.0000 mg | Freq: Four times a day (QID) | INTRAVENOUS | Status: AC
  Administered 2024-01-02 – 2024-01-03 (×3): 1000 mg via INTRAVENOUS
  Filled 2024-01-02 (×4): qty 100

## 2024-01-02 MED ORDER — SODIUM CHLORIDE 0.9% FLUSH
3.0000 mL | Freq: Two times a day (BID) | INTRAVENOUS | Status: DC
  Administered 2024-01-02 – 2024-01-07 (×10): 3 mL via INTRAVENOUS

## 2024-01-02 MED ORDER — SODIUM CHLORIDE 0.9 % IV BOLUS
1000.0000 mL | Freq: Once | INTRAVENOUS | Status: AC
  Administered 2024-01-02: 1000 mL via INTRAVENOUS

## 2024-01-02 MED ORDER — HYDROCORTISONE SOD SUC (PF) 100 MG IJ SOLR
50.0000 mg | Freq: Three times a day (TID) | INTRAMUSCULAR | Status: DC
  Administered 2024-01-02 – 2024-01-03 (×3): 50 mg via INTRAVENOUS
  Filled 2024-01-02 (×4): qty 1

## 2024-01-02 MED ORDER — SODIUM CHLORIDE 0.9 % IV BOLUS: 1000.0000 mL | Freq: Once | INTRAVENOUS | Status: DC

## 2024-01-02 NOTE — Assessment & Plan Note (Addendum)
 Blood pressure meds on hold.  Patient takes olmesartan HCTZ and minoxidil at home.

## 2024-01-02 NOTE — Assessment & Plan Note (Signed)
 Lab Results  Component Value Date   CREATININE 1.33 (H) 01/02/2024   CREATININE 1.06 (H) 07/15/2023   CREATININE 0.96 06/01/2023  Currently we will hold patient's ARB therapy and diuretic therapy.  Continue with gentle IV fluid hydration.  Avoid contrast therapy and renally dose needed medications.

## 2024-01-02 NOTE — H&P (Signed)
 History and Physical    Patient: Jody Brewer FMW:[id number] DOB: May 15, 1951 DOA: 01/02/2024 DOS: the patient was seen and examined on 01/02/2024 PCP: Cleotilde Oneil FALCON, MD  Patient coming from: Home Chief complaint: Chief Complaint  Patient presents with   Shortness of Breath   HPI:  Jody Brewer is a 73 y.o. female with past medical history  of essential hypertension, AKI, GERD, recurrent UTI, multinodular goiter, allergies to atorvastatin  and meloxicam along with COVID medication, currently on HRT with estrogen, anemia, sleep apnea, presenting with complaints of SOB with chills, dizziness, migraine and hypotension over the past few days.  Admission requested per ED provider who thinks that patient is bacteremic from gram-negative UTI source as patient has a history of recurrent UTI has been evaluated by urology as well recently per chart note. Pt is hard of hearing and reads lips. Has multiple symptoms since her appendectomy in may 2024.  Family at bedside.  Although patient is alert awake and oriented she is somewhat of limited historian has multiple vague symptoms and complaints that have kind of been going off and on for past few days.  Also has certain complaints of not feeling well since her appendectomy which is over the past few months.  ED Course: In emergency room vitals trend shows patient's blood pressure has been soft patient is febrile meeting sepsis criteria at heart rate of 99 respirations of 22 source of UTI. Vitals:   01/02/24 1322 01/02/24 1732 01/02/24 1908 01/02/24 2045  BP: 110/75 (!) 94/57  (!) 96/42  Pulse: 96 99  88  Temp: 98.9 F (37.2 C) (!) 101.2 F (38.4 C) (!) 100.7 F (38.2 C)   Resp: (!) 22 15  15   Height: 5' 5 (1.651 m)     Weight: 77.1 kg     SpO2: 100% 100%  97%  TempSrc: Oral Oral Oral   BMI (Calculated): 28.29      Evaluation so far shows: Metabolic panel shows AKI of 1.33 normal kidney function normal LFTs. Lactic acid 1.2  procalcitonin 0.15, D-dimer 0.43. Respiratory panel negative for flu RSV and COVID. Urinalysis is essentially negative except with hazy urine.. Blood and urine cultures obtained. Patient had a CT of the abdomen and pelvis with contrast in July 2024 showing hiatal hernia, diverticuli, atelectasis. Chest x-ray done today is negative for any acute process. EKG shows sinus rhythm at 93 bpm PR of 144 upright axis, nonspecific ST changes in lead III, QTc of 484 Previous echocardiogram showed left ventricular systolic and diastolic function.   In the ED pt received: Medications  acetaminophen  (OFIRMEV ) IV 1,000 mg (0 mg Intravenous Stopped 01/02/24 1826)  ondansetron  (ZOFRAN -ODT) disintegrating tablet 4 mg (4 mg Oral Given 01/02/24 1348)  sodium chloride  0.9 % bolus 1,000 mL (0 mLs Intravenous Stopped 01/02/24 1826)  ondansetron  (ZOFRAN ) injection 4 mg (4 mg Intravenous Given 01/02/24 1721)  sodium chloride  0.9 % bolus 500 mL (500 mLs Intravenous New Bag/Given 01/02/24 1831)  cefTRIAXone  (ROCEPHIN ) 1 g in sodium chloride  0.9 % 100 mL IVPB (1 g Intravenous New Bag/Given 01/02/24 1814)  Review of Systems  Constitutional:  Positive for chills, fever and malaise/fatigue.  Respiratory:  Positive for shortness of breath.   Neurological:  Positive for dizziness and headaches.   Past Medical History:  Diagnosis Date   Asthma    COVID-19 11/2019   hospitalized x2   GERD (gastroesophageal reflux disease)    Headache    Hypertension    Sleep apnea  Past Surgical History:  Procedure Laterality Date   ABDOMINAL HYSTERECTOMY     APPENDECTOMY     CATARACT EXTRACTION W/PHACO Left 06/10/2021   Procedure: CATARACT EXTRACTION PHACO AND INTRAOCULAR LENS PLACEMENT (IOC) LEFT;  Surgeon: Mittie Gaskin, MD;  Location: [facility];  Service: Ophthalmology;  Laterality: Left;  5.69 0:53.6 10.6%   CATARACT EXTRACTION W/PHACO Right 06/24/2021   Procedure: CATARACT EXTRACTION PHACO AND INTRAOCULAR LENS  PLACEMENT (IOC) RIGHT;  Surgeon: Mittie Gaskin, MD;  Location: [facility];  Service: Ophthalmology;  Laterality: Right;  3.61 00:52.4   CHOLECYSTECTOMY     ELBOW BURSA SURGERY Right    FRENULECTOMY, LINGUAL     and removal of portion of soft palate   LUMBAR DISC SURGERY  1996   L3-L5   LUMBAR DISC SURGERY  2004   LUMBAR FUSION  2006   bone harvest rods (2) and screws (36)   LUMBAR LAMINECTOMY  1998   L3-L5   MAXILLARY ANTROSTOMY Right 08/12/2022   Procedure: MAXILLARY ANTROSTOMY REVISION;  Surgeon: Edda Mt, MD;  Location: [facility];  Service: ENT;  Laterality: Right;  placed disk on OR charge nurse desk 8-8 kp   OOPHORECTOMY     1 ovary removed   TOTAL KNEE ARTHROPLASTY Right 11/17/2021   Procedure: TOTAL KNEE ARTHROPLASTY;  Surgeon: Edie Norleen PARAS, MD;  Location: [facility];  Service: Orthopedics;  Laterality: Right;   UVULOPALATOPHARYNGOPLASTY  2001   XI ROBOTIC LAPAROSCOPIC ASSISTED APPENDECTOMY N/A 05/31/2023   Procedure: XI ROBOTIC LAPAROSCOPIC ASSISTED APPENDECTOMY;  Surgeon: Desiderio Schanz, MD;  Location: [facility];  Service: General;  Laterality: N/A;    reports that she has never smoked. She has never been exposed to tobacco smoke. She has never used smokeless tobacco. She reports that she does not currently use alcohol after a past usage of about 3.0 standard drinks of alcohol per week. She reports that she does not currently use drugs.  Allergies  Allergen Reactions   Nirmatrelvir-Ritonavir Nausea Only     Severe nausea and vomiting.   Atorvastatin  Palpitations and Other (See Comments)    Muscle pain and severe twitching   Meloxicam Rash    Tolerates ibuprofen    Family History  Problem Relation Age of Onset   Emphysema Mother    Parkinson's disease Father    Dementia Father    Hypertension Father    Breast cancer Neg Hx    Prior to Admission medications   Medication Sig Start Date End Date Taking? Authorizing Provider   amoxicillin -clavulanate (AUGMENTIN ) 875-125 MG tablet Take 1 tablet by mouth every 12 (twelve) hours. 12/14/23   McGowan, Clotilda LABOR, PA-C  Cholecalciferol  (DIALYVITE VITAMIN D  5000) 125 MCG (5000 UT) capsule Take 5,000 Units by mouth daily.    [provider]  estradiol  (ESTRACE ) 0.1 MG/GM vaginal cream Estrogen Cream Instruction Discard applicator Apply pea sized amount to tip of finger to urethra before bed. Wash hands well after application. Use Monday, Wednesday and Friday 08/30/23   Francisca Redell BROCKS, MD  estradiol  (ESTRACE ) 0.5 MG tablet Take 0.5 mg by mouth daily. 10/15/19   [provider]  ferrous sulfate 325 (65 FE) MG EC tablet Take 325 mg by mouth 2 (two) times a week.    [provider]  gabapentin  (NEURONTIN ) 300 MG capsule Take 300 mg by mouth at bedtime.    [provider]  ibuprofen  (ADVIL ) 800 MG tablet Take 1 tablet by mouth 2 (two) times daily as needed. 02/28/23   [provider]  minoxidil (LONITEN) 2.5 MG tablet Take by mouth. 09/16/23 09/18/24  [provider]  montelukast  (SINGULAIR ) 10 MG tablet Take 10 mg by mouth daily.    [provider]  Multiple Vitamin (MULTI-VITAMIN) tablet Take 1 tablet by mouth daily.    [provider]  nitrofurantoin , macrocrystal-monohydrate, (MACROBID ) 100 MG capsule Take 1 capsule (100 mg total) by mouth daily. 08/30/23   Francisca Redell BROCKS, MD  nortriptyline  (PAMELOR ) 10 MG capsule Take 20 mg by mouth at bedtime.    [provider]  olmesartan -hydrochlorothiazide  (BENICAR  HCT) 20-12.5 MG tablet Take 1 tablet by mouth daily. 06/09/23   [provider]  pantoprazole  (PROTONIX ) 40 MG tablet Take 40 mg by mouth 2 (two) times daily. 10/15/19   [provider]  venlafaxine  XR (EFFEXOR -XR) 150 MG 24 hr capsule Take 150 mg by mouth daily with breakfast.    [provider]  vitamin E  400 UNIT capsule Take 400 Units by mouth 2 (two) times daily.    [provider]  zinc  sulfate 220 (50 Zn) MG capsule Take 220 mg by mouth daily.    [provider]   Vitals:   01/02/24 1322 01/02/24 1732 01/02/24 1908 01/02/24 2045  BP: 110/75 (!) 94/57  (!) 96/42  Pulse: 96 99  88  Resp: (!) 22 15  15   Temp: 98.9 F (37.2 C) (!) 101.2 F (38.4 C) (!) 100.7 F (38.2 C)   TempSrc: Oral Oral Oral   SpO2: 100% 100%  97%  Weight: 77.1 kg     Height: 5' 5 (1.651 m)      Physical Exam Vitals and nursing note reviewed.  Constitutional:      General: She is not in acute distress. HENT:     Head: Normocephalic and atraumatic.     Right Ear: Hearing normal.     Left Ear: Hearing normal.     Nose: Nose normal. No nasal deformity.     Mouth/Throat:     Lips: Pink.     Tongue: No lesions.     Pharynx: Oropharynx is clear.  Eyes:     General: Lids are normal.     Extraocular Movements: Extraocular movements intact.  Cardiovascular:     Rate and Rhythm: Normal rate and regular rhythm.     Heart sounds: Normal heart sounds.  Pulmonary:     Effort: Pulmonary effort is normal.     Breath sounds: Normal breath sounds.  Abdominal:     General: Bowel sounds are normal. There is no distension.     Palpations: Abdomen is soft. There is no mass.     Tenderness: There is no abdominal tenderness.  Musculoskeletal:     Right lower leg: No edema.     Left lower leg: No edema.  Skin:    General: Skin is warm.  Neurological:     General: No focal deficit present.     Mental Status: She is alert and oriented to person, place, and time.     Cranial Nerves: Cranial nerves 2-12 are intact.  Psychiatric:        Attention and Perception: Attention normal.        Mood and Affect: Mood normal.        Speech: Speech normal.        Behavior: Behavior normal. Behavior is cooperative.      Labs on Admission: I have personally reviewed following labs and imaging studies  Results for orders placed or  performed during the hospital encounter of 01/02/24  (from the past 24 hours)  Basic metabolic panel     Status: Abnormal   Collection Time: 01/02/24  1:45 PM  Result Value Ref Range   Sodium 138 135 - 145 mmol/L   Potassium 3.5 3.5 - 5.1 mmol/L   Chloride 102 98 - 111 mmol/L   CO2 24 22 - 32 mmol/L   Glucose, Bld 101 (H) 70 - 99 mg/dL   BUN 22 8 - 23 mg/dL   Creatinine, Ser 8.66 (H) 0.44 - 1.00 mg/dL   Calcium  9.2 8.9 - 10.3 mg/dL   GFR, Estimated 43 (L) >60 mL/min   Anion gap 12 5 - 15  CBC     Status: Abnormal   Collection Time: 01/02/24  1:45 PM  Result Value Ref Range   WBC 10.5 4.0 - 10.5 K/uL   RBC 3.63 (L) 3.87 - 5.11 MIL/uL   Hemoglobin 11.0 (L) 12.0 - 15.0 g/dL   HCT 67.8 (L) 63.9 - 53.9 %   MCV 88.4 80.0 - 100.0 fL   MCH 30.3 26.0 - 34.0 pg   MCHC 34.3 30.0 - 36.0 g/dL   RDW 86.2 88.4 - 84.4 %   Platelets 289 150 - 400 K/uL   nRBC 0.0 0.0 - 0.2 %  Troponin I (High Sensitivity)     Status: None   Collection Time: 01/02/24  1:45 PM  Result Value Ref Range   Troponin I (High Sensitivity) 5 <18 ng/L  Lipase, blood     Status: None   Collection Time: 01/02/24  1:45 PM  Result Value Ref Range   Lipase 27 11 - 51 U/L  Resp panel by RT-PCR (RSV, Flu A&B, Covid) Anterior Nasal Swab     Status: None   Collection Time: 01/02/24  1:45 PM   Specimen: Anterior Nasal Swab  Result Value Ref Range   SARS Coronavirus 2 by RT PCR NEGATIVE NEGATIVE   Influenza A by PCR NEGATIVE NEGATIVE   Influenza B by PCR NEGATIVE NEGATIVE   Resp Syncytial Virus by PCR NEGATIVE NEGATIVE  Urinalysis, Routine w reflex microscopic -Urine, Clean Catch     Status: Abnormal   Collection Time: 01/02/24  5:15 PM  Result Value Ref Range   Color, Urine YELLOW (A) YELLOW   APPearance HAZY (A) CLEAR   Specific Gravity, Urine 1.014 1.005 - 1.030   pH 5.0 5.0 - 8.0   Glucose, UA NEGATIVE NEGATIVE mg/dL   Hgb urine dipstick NEGATIVE NEGATIVE   Bilirubin Urine NEGATIVE NEGATIVE   Ketones, ur NEGATIVE NEGATIVE mg/dL   Protein, ur NEGATIVE NEGATIVE mg/dL    Nitrite NEGATIVE NEGATIVE   Leukocytes,Ua NEGATIVE NEGATIVE  Lactic acid, plasma     Status: None   Collection Time: 01/02/24  5:15 PM  Result Value Ref Range   Lactic Acid, Venous 1.2 0.5 - 1.9 mmol/L  D-dimer, quantitative     Status: None   Collection Time: 01/02/24  5:15 PM  Result Value Ref Range   D-Dimer, Quant 0.43 0.00 - 0.50 ug/mL-FEU  Procalcitonin     Status: None   Collection Time: 01/02/24  5:15 PM  Result Value Ref Range   Procalcitonin 0.15 ng/mL  Hepatic function panel     Status: Abnormal   Collection Time: 01/02/24  5:15 PM  Result Value Ref Range   Total Protein 7.5 6.5 - 8.1 g/dL   Albumin 4.1 3.5 - 5.0 g/dL   AST 30 15 - 41 U/L  ALT 21 0 - 44 U/L   Alkaline Phosphatase 68 38 - 126 U/L   Total Bilirubin 1.2 0.0 - 1.2 mg/dL   Bilirubin, Direct 0.2 0.0 - 0.2 mg/dL   Indirect Bilirubin 1.0 (H) 0.3 - 0.9 mg/dL  TSH     Status: None   Collection Time: 01/02/24  5:15 PM  Result Value Ref Range   TSH 2.235 0.350 - 4.500 uIU/mL  T4, free     Status: None   Collection Time: 01/02/24  5:15 PM  Result Value Ref Range   Free T4 0.81 0.61 - 1.12 ng/dL  Magnesium      Status: None   Collection Time: 01/02/24  5:15 PM  Result Value Ref Range   Magnesium  2.3 1.7 - 2.4 mg/dL   Recent Results (from the past 720 hours)  CULTURE, URINE COMPREHENSIVE     Status: Abnormal   Collection Time: 12/14/23  2:13 PM   Specimen: Urine   UR  Result Value Ref Range Status   Urine Culture, Comprehensive Final report (A)  Final   Organism ID, Bacteria Escherichia coli (A)  Final    Comment: Cefazolin  <=4 ug/mL Cefazolin  with an MIC <=16 predicts susceptibility to the oral agents cefaclor, cefdinir , cefpodoxime, cefprozil, cefuroxime , cephalexin , and loracarbef when used for therapy of uncomplicated urinary tract infections due to E. coli, Klebsiella pneumoniae, and Proteus mirabilis. Multi-Drug Resistant Organism Greater than 100,000 colony forming units per mL     ANTIMICROBIAL SUSCEPTIBILITY Comment  Final    Comment:       ** S = Susceptible; I = Intermediate; R = Resistant **                    P = Positive; N = Negative             MICS are expressed in micrograms per mL    Antibiotic                 RSLT#1    RSLT#2    RSLT#3    RSLT#4 Amoxicillin /Clavulanic Acid    I Ampicillin                      R Cefepime                       S Ceftriaxone                     S Cefuroxime                      S Ciprofloxacin                  R Ertapenem                      S Gentamicin                     S Imipenem                       S Levofloxacin                    R Meropenem                      S Nitrofurantoin                  S Piperacillin /Tazobactam  S Tetracycline                   R Tobramycin                     S Trimethoprim/Sulfa             R   Microscopic Examination     Status: Abnormal   Collection Time: 12/14/23  2:13 PM   Urine  Result Value Ref Range Status   WBC, UA >30 (A) 0 - 5 /hpf Final   RBC, Urine 3-10 (A) 0 - 2 /hpf Final   Epithelial Cells (non renal) 0-10 0 - 10 /hpf Final   Bacteria, UA Many (A) None seen/Few Final  Resp panel by RT-PCR (RSV, Flu A&B, Covid) Anterior Nasal Swab     Status: None   Collection Time: 01/02/24  1:45 PM   Specimen: Anterior Nasal Swab  Result Value Ref Range Status   SARS Coronavirus 2 by RT PCR NEGATIVE NEGATIVE Final    Comment: (NOTE) SARS-CoV-2 target nucleic acids are NOT DETECTED.  The SARS-CoV-2 RNA is generally detectable in upper respiratory specimens during the acute phase of infection. The lowest concentration of SARS-CoV-2 viral copies this assay can detect is 138 copies/mL. A negative result does not preclude SARS-Cov-2 infection and should not be used as the sole basis for treatment or other patient management decisions. A negative result may occur with  improper specimen collection/handling, submission of specimen other than nasopharyngeal swab, presence of  viral mutation(s) within the areas targeted by this assay, and inadequate number of viral copies(<138 copies/mL). A negative result must be combined with clinical observations, patient history, and epidemiological information. The expected result is Negative.  Fact Sheet for Patients:  bloggercourse.com  Fact Sheet for Healthcare Providers:  seriousbroker.it  This test is no t yet approved or cleared by the [place]  FDA and  has been authorized for detection and/or diagnosis of SARS-CoV-2 by FDA under an Emergency Use Authorization (EUA). This EUA will remain  in effect (meaning this test can be used) for the duration of the COVID-19 declaration under Section 564(b)(1) of the Act, 21 U.S.C.section 360bbb-3(b)(1), unless the authorization is terminated  or revoked sooner.       Influenza A by PCR NEGATIVE NEGATIVE Final   Influenza B by PCR NEGATIVE NEGATIVE Final    Comment: (NOTE) The Xpert Xpress SARS-CoV-2/FLU/RSV plus assay is intended as an aid in the diagnosis of influenza from Nasopharyngeal swab specimens and should not be used as a sole basis for treatment. Nasal washings and aspirates are unacceptable for Xpert Xpress SARS-CoV-2/FLU/RSV testing.  Fact Sheet for Patients: bloggercourse.com  Fact Sheet for Healthcare Providers: seriousbroker.it  This test is not yet approved or cleared by the [place]  FDA and has been authorized for detection and/or diagnosis of SARS-CoV-2 by FDA under an Emergency Use Authorization (EUA). This EUA will remain in effect (meaning this test can be used) for the duration of the COVID-19 declaration under Section 564(b)(1) of the Act, 21 U.S.C. section 360bbb-3(b)(1), unless the authorization is terminated or revoked.     Resp Syncytial Virus by PCR NEGATIVE NEGATIVE Final    Comment: (NOTE) Fact Sheet for  Patients: bloggercourse.com  Fact Sheet for Healthcare Providers: seriousbroker.it  This test is not yet approved or cleared by the [place]  FDA and has been authorized for detection and/or diagnosis of SARS-CoV-2 by FDA under an Emergency Use Authorization (EUA). This EUA will  remain in effect (meaning this test can be used) for the duration of the COVID-19 declaration under Section 564(b)(1) of the Act, 21 U.S.C. section 360bbb-3(b)(1), unless the authorization is terminated or revoked.  Performed at [facility], [address] [place], [place] [address]    CBC:    Latest Ref Rng & Units 01/02/2024    1:45 PM 07/15/2023   12:31 PM 06/01/2023    4:23 AM  CBC  WBC 4.0 - 10.5 K/uL 10.5  10.7  9.9   Hemoglobin 12.0 - 15.0 g/dL 88.9  87.4  88.7   Hematocrit 36.0 - 46.0 % 32.1  38.4  34.1   Platelets 150 - 400 K/uL 289  238  269    Basic Metabolic Panel: Recent Labs  Lab 01/02/24 1345 01/02/24 1715  NA 138  --   K 3.5  --   CL 102  --   CO2 24  --   GLUCOSE 101*  --   BUN 22  --   CREATININE 1.33*  --   CALCIUM  9.2  --   MG  --  2.3   Creatinine: Lab Results  Component Value Date   CREATININE 1.33 (H) 01/02/2024   CREATININE 1.06 (H) 07/15/2023   CREATININE 0.96 06/01/2023    Liver Function Tests:    Latest Ref Rng & Units 01/02/2024    5:15 PM 07/15/2023   12:31 PM 05/30/2023    6:57 PM  Hepatic Function  Total Protein 6.5 - 8.1 g/dL 7.5  6.7  7.6   Albumin 3.5 - 5.0 g/dL 4.1  3.5  4.6   AST 15 - 41 U/L 30  30  27    ALT 0 - 44 U/L 21  32  22   Alk Phosphatase 38 - 126 U/L 68  93  94   Total Bilirubin 0.0 - 1.2 mg/dL 1.2  0.5  0.6   Bilirubin, Direct 0.0 - 0.2 mg/dL 0.2      Coagulation Profile: No results for input(s): INR, PROTIME in the last 168 hours. Cardiac Enzymes: No results for input(s): CKTOTAL, CKMB, CKMBINDEX, TROPONINI in the last 168 hours. BNP (last 3 results) No  results for input(s): PROBNP in the last 8760 hours. HbA1C: No results for input(s): HGBA1C in the last 72 hours. Lipid Profile: No results for input(s): CHOL, HDL, LDLCALC, TRIG, CHOLHDL, LDLDIRECT in the last 72 hours. Urinalysis    Component Value Date/Time   COLORURINE YELLOW (A) 01/02/2024 1715   APPEARANCEUR HAZY (A) 01/02/2024 1715   APPEARANCEUR Cloudy (A) 12/14/2023 1413   LABSPEC 1.014 01/02/2024 1715   PHURINE 5.0 01/02/2024 1715   GLUCOSEU NEGATIVE 01/02/2024 1715   HGBUR NEGATIVE 01/02/2024 1715   BILIRUBINUR NEGATIVE 01/02/2024 1715   BILIRUBINUR Negative 12/14/2023 1413   KETONESUR NEGATIVE 01/02/2024 1715   PROTEINUR NEGATIVE 01/02/2024 1715   NITRITE NEGATIVE 01/02/2024 1715   LEUKOCYTESUR NEGATIVE 01/02/2024 1715     Unresulted Labs (From admission, onward)     Start     Ordered   01/03/24 0500  Comprehensive metabolic panel  Tomorrow morning,   R        01/02/24 1942   01/03/24 0500  CBC  Tomorrow morning,   R        01/02/24 1942   01/02/24 2123  Type and screen  Once,   R        01/02/24 2122   01/02/24 2055  Hemoglobin A1c  Add-on,   AD  01/02/24 2054   01/02/24 1955  Ethanol  Once,   R        01/02/24 1955   01/02/24 1806  Blood culture (routine x 2)  BLOOD CULTURE X 2,   STAT      01/02/24 1806   01/02/24 1701  Urine Culture  Add-on,   AD       Question:  Indication  Answer:  Dysuria   01/02/24 1700   01/02/24 1347  Lactic acid, plasma  (Lactic Acid)  Now then every 2 hours,   STAT      01/02/24 1346            Radiological Exams on Admission: DG Chest 2 View Result Date: 01/02/2024 CLINICAL DATA:  Shortness of breath and bilateral chest pain EXAM: CHEST - 2 VIEW COMPARISON:  01/23/2023 FINDINGS: Thoracolumbar spine fixation. Midline trachea. Normal heart size and mediastinal contours for age. No pleural effusion or pneumothorax. Mild right hemidiaphragm elevation. Clear lungs. IMPRESSION: No active cardiopulmonary  disease. Electronically Signed   By: Rockey Kilts M.D.   On: 01/02/2024 14:53    Data Reviewed: Relevant notes from primary care and specialist visits, past discharge summaries as available in EHR, including Care Everywhere. Prior diagnostic testing as pertinent to current admission diagnoses, Updated medications and problem lists for reconciliation ED course, including vitals, labs, imaging, treatment and response to treatment,Triage notes, nursing and pharmacy notes and ED provider's notes Notable results as noted in HPI.Discussed case with EDMD/ ED APP/ or Specialty MD on call and as needed.  Assessment and Plan: * Dizziness Secondary to hypotension, combination of meds decreased p.o. intake and infection.  Will continue with normal saline  @ 75 cc/hour -MIVF.  Fall precaution aspiration precaution.  Headache Intermittent headache and dizziness. We will get Head ct noncontrast.    Hypotension Vitals:   01/02/24 1322 01/02/24 1732 01/02/24 2045  BP: 110/75 (!) 94/57 (!) 96/42  Since arrival pt's BP has been soft.   Intake/Output Summary (Last 24 hours) at 01/02/2024 2131 Last data filed at 01/02/2024 1909 Gross per 24 hour  Intake 1665.83 ml  Output --  Net 1665.83 ml  Will given one L bolus.  D/D include septic shock but pt has not WBC count or pos U/A.  Is anemic but no h/o blood loss.  Has had Echo in 2021 which was essentially normal except for grade 1 diastolic dysfunction.  Will order echo again AM md to d/c if BP improves in am.  Cont MIVF ns at 75.     Sepsis secondary to UTI [facility]) Patient meet sepsis criteria suspected from urinary tract infection lactic is within normal limit continue with MIVF and IV antibiotics diet as tolerated test of cure prior to discharge with a urinalysis and repeat culture.  Multinodular goiter Will get a free T4 and a TSH level.  Recurrent UTI Patient currently has completed Augmentin  and is probably why urinalysis appears bland,  patient has sepsis from presumed  UTI we will continue with Rocephin  therapy follow-up with IV fluids additional supportive measures and follow culture sensitivity and tailor therapy further.  GERD (gastroesophageal reflux disease) IV PPI therapy.  Type and screen, avoid ibuprofen .  Anemia    Latest Ref Rng & Units 01/02/2024    1:45 PM 07/15/2023   12:31 PM 06/01/2023    4:23 AM  CBC  WBC 4.0 - 10.5 K/uL 10.5  10.7  9.9   Hemoglobin 12.0 - 15.0 g/dL 88.9  87.4  88.7  Hematocrit 36.0 - 46.0 % 32.1  38.4  34.1   Platelets 150 - 400 K/uL 289  238  269   Avoid ibuprofen .  Type and screen, IV PPI.  Stool occult as needed.   AKI (acute kidney injury) (HCC) Lab Results  Component Value Date   CREATININE 1.33 (H) 01/02/2024   CREATININE 1.06 (H) 07/15/2023   CREATININE 0.96 06/01/2023  Currently we will hold patient's ARB therapy and diuretic therapy.  Continue with gentle IV fluid hydration.  Avoid contrast therapy and renally dose needed medications.   Essential hypertension Blood pressure meds on hold.  Patient takes olmesartan  HCTZ and minoxidil at home.    Prognosis: Fair.  DVT prophylaxis:  SCD's Consults:  ID Advance Care Planning:    Code Status: Full Code   Family Communication:  Daughter and son at bedside.   Disposition Plan:  Home  Severity of Illness: The appropriate patient status for this patient is INPATIENT. Inpatient status is judged to be reasonable and necessary in order to provide the required intensity of service to ensure the patient's safety. The patient's presenting symptoms, physical exam findings, and initial radiographic and laboratory data in the context of their chronic comorbidities is felt to place them at high risk for further clinical deterioration. Furthermore, it is not anticipated that the patient will be medically stable for discharge from the hospital within 2 midnights of admission.   * I certify that at the point of admission it is my  clinical judgment that the patient will require inpatient hospital care spanning beyond 2 midnights from the point of admission due to high intensity of service, high risk for further deterioration and high frequency of surveillance required.*  Author: Mario LULLA Blanch, MD 01/02/2024 9:45 PM  For on call review [email or web address].   Orders Placed This Encounter  Procedures   Resp panel by RT-PCR (RSV, Flu A&B, Covid) Anterior Nasal Swab   Urine Culture   Blood culture (routine x 2)   DG Chest 2 View   CT HEAD WO CONTRAST ( )   Basic metabolic panel   CBC   Lipase, blood   Urinalysis, Routine w reflex microscopic -Urine, Clean Catch   Lactic acid, plasma   D-dimer, quantitative   Procalcitonin   Hepatic function panel   Comprehensive metabolic panel   CBC   TSH   T4, free   Magnesium    Ethanol   Hemoglobin A1c   Diet Heart Room service appropriate? Yes; Fluid consistency: Thin   Document Height and Actual Weight   DO NOT delay antibiotics if unable to obtain blood culture.   Maintain IV access   Vital signs   Notify physician (specify)   Mobility Protocol: No Restrictions   Refer to Sidebar Report Refer to ICU, Med-Surg, Progressive, and Step-Down Mobility Protocol Sidebars   Daily weights   Intake and Output   Do not place and if present remove PureWick   Initiate Oral Care Protocol   Initiate Carrier Fluid Protocol   RN may order General Admission PRN Orders utilizing General Admission PRN medications (through manage orders) for the following patient needs: allergy symptoms (Claritin ), cold sores (Carmex), cough (Robitussin DM), eye irritation (Liquifilm Tears), hemorrhoids (Tucks), indigestion (Maalox), minor skin irritation (Hydrocortisone  Cream), muscle pain Lucienne Gay), nose irritation (saline nasal spray) and sore throat (Chloraseptic spray).   Cardiac Monitoring - Continuous Indefinite   Swallow screen   Full code   Consult to hospitalist   Code Sepsis activation.  This  occurs  automatically when order is signed and prioritizes pharmacy, lab, and radiology services for STAT collections and interventions.  If [facility] downtime, call [facility] [phone]) to activate Code Sepsis.   Consult to infectious diseases Consult Timeframe: ROUTINE - requires response within 24 hours; Reason for Consult? sepsis / REC uti   Pulse oximetry check with vital signs   Oxygen therapy Mode or (Route): Nasal cannula; Liters Per Minute: 2; Keep O2 saturation between: greater than 92 %   EKG 12-Lead   ED EKG   ECHOCARDIOGRAM COMPLETE   Type and screen   Admit to Inpatient (patient's expected length of stay will be greater than 2 midnights or inpatient only procedure)   Aspiration precautions   Fall precautions

## 2024-01-02 NOTE — Sepsis Progress Note (Signed)
eLink monitoring code sepsis.  

## 2024-01-02 NOTE — Assessment & Plan Note (Signed)
 IV PPI therapy.  Type and screen, avoid ibuprofen.

## 2024-01-02 NOTE — Assessment & Plan Note (Signed)
    Latest Ref Rng & Units 01/02/2024    1:45 PM 07/15/2023   12:31 PM 06/01/2023    4:23 AM  CBC  WBC 4.0 - 10.5 K/uL 10.5  10.7  9.9   Hemoglobin 12.0 - 15.0 g/dL 88.9  87.4  88.7   Hematocrit 36.0 - 46.0 % 32.1  38.4  34.1   Platelets 150 - 400 K/uL 289  238  269   Avoid ibuprofen .  Type and screen, IV PPI.  Stool occult as needed.

## 2024-01-02 NOTE — ED Notes (Signed)
 RN unable to get EKG due to pt shaking uncontrollable.

## 2024-01-02 NOTE — Progress Notes (Signed)
 CODE SEPSIS - PHARMACY COMMUNICATION  **Broad Spectrum Antibiotics should be administered within 1 hour of Sepsis diagnosis**  Time Code Sepsis Called/Page Received: 1812  Antibiotics Ordered: ceftriaxone   Time of 1st antibiotic administration: 1814  Additional action taken by pharmacy: N/A  If necessary, Name of Provider/Nurse Contacted: N/A    Lum VEAR Mania ,PharmD Clinical Pharmacist  01/02/2024  6:13 PM

## 2024-01-02 NOTE — Assessment & Plan Note (Signed)
 Will get a free T4 and a TSH level.

## 2024-01-02 NOTE — Assessment & Plan Note (Addendum)
 Secondary to hypotension, combination of meds decreased p.o. intake and infection.  Will continue with normal saline  @ 75 cc/hour -MIVF.  Fall precaution aspiration precaution.

## 2024-01-02 NOTE — Assessment & Plan Note (Signed)
 Patient currently has completed Augmentin  and is probably why urinalysis appears bland, patient has sepsis from presumed  UTI we will continue with Rocephin  therapy follow-up with IV fluids additional supportive measures and follow culture sensitivity and tailor therapy further.

## 2024-01-02 NOTE — Assessment & Plan Note (Signed)
 Vitals:   01/02/24 1322 01/02/24 1732 01/02/24 2045  BP: 110/75 (!) 94/57 (!) 96/42  Since arrival pt's BP has been soft.   Intake/Output Summary (Last 24 hours) at 01/02/2024 2131 Last data filed at 01/02/2024 1909 Gross per 24 hour  Intake 1665.83 ml  Output --  Net 1665.83 ml  Will given one L bolus.  D/D include septic shock but pt has not WBC count or pos U/A.  Is anemic but no h/o blood loss.  Has had Echo in 2021 which was essentially normal except for grade 1 diastolic dysfunction.  Will order echo again AM md to d/c if BP improves in am.  Cont MIVF ns at 75.

## 2024-01-02 NOTE — Assessment & Plan Note (Signed)
 Patient meet sepsis criteria suspected from urinary tract infection lactic is within normal limit continue with MIVF and IV antibiotics diet as tolerated test of cure prior to discharge with a urinalysis and repeat culture.

## 2024-01-02 NOTE — Assessment & Plan Note (Signed)
 Intermittent headache and dizziness. We will get Head ct noncontrast.

## 2024-01-02 NOTE — ED Provider Notes (Signed)
 [facility] Provider Note    Event Date/Time   First MD Initiated Contact with Patient 01/02/24 1650     (approximate)   History   Shortness of Breath   HPI  Jody Brewer is a 73 y.o. female with a history of asthma previous COVID requiring hospitalization and headaches hypertension recurrent urinary tract infection   For about 1 week now has been having bodyaches fatigue chills dry cough and decreased appetite with mild nausea.  No abdominal pain no pain or burning with urination.  Recently treated for a urinary tract infection with Augmentin  and she reports she felt well until about the day after she completed the Augmentin  which case symptoms of bodyaches fatigue and other symptoms returned but not having any pain or burning with urination.  She feels fatigued some nausea decreased appetite.  Her blood pressure was low at home in the mid 90s today, she feels like it could be from being dehydrated.  Slight achiness in her chest with cough that is nonproductive.  She had a fever of about 100.4 over the last couple of days treated yesterday last with Tylenol   Physical Exam   Triage Vital Signs: ED Triage Vitals  Encounter Vitals Group     BP 01/02/24 1322 110/75     Systolic BP Percentile --      Diastolic BP Percentile --      Pulse Rate 01/02/24 1322 96     Resp 01/02/24 1322 (!) 22     Temp 01/02/24 1322 98.9 F (37.2 C)     Temp Source 01/02/24 1322 Oral     SpO2 01/02/24 1322 100 %     Weight 01/02/24 1322 170 lb (77.1 kg)     Height 01/02/24 1322 5' 5 (1.651 m)     Head Circumference --      Peak Flow --      Pain Score 01/02/24 1342 5     Pain Loc --      Pain Education --      Exclude from Growth Chart --     Most recent vital signs: Vitals:   01/02/24 2045 01/02/24 2228  BP: (!) 96/42 (!) 90/53  Pulse: 88 86  Resp: 15 12  Temp:  97.8 F (36.6 C)  SpO2: 97% 99%     General: Awake, no distress.  Normocephalic atraumatic.   She and her daughter at the bedside both very pleasant.  She appears fatigued and generally ill but in no acute distress CV:  Good peripheral perfusion.  Normal tones and rate Resp:  Normal effort.  Clear bilateral.  No wheezing.  Occasional dry cough Abd:  No distention.  Soft nontender nondistended.  No producible costovertebral angle tenderness Other:  Warm well-perfused skin.  Mucous membranes slightly dry.  She is able to sit herself up does not appear to be in acute extremis, she is able to get back and forth to the commode with the assistance of her daughter   ED Results / Procedures / Treatments   Labs (all labs ordered are listed, but only abnormal results are displayed) Labs Reviewed  BASIC METABOLIC PANEL - Abnormal; Notable for the following components:      Result Value   Glucose, Bld 101 (*)    Creatinine, Ser 1.33 (*)    GFR, Estimated 43 (*)    All other components within normal limits  CBC - Abnormal; Notable for the following components:   RBC 3.63 (*)  Hemoglobin 11.0 (*)    HCT 32.1 (*)    All other components within normal limits  URINALYSIS, ROUTINE W REFLEX MICROSCOPIC - Abnormal; Notable for the following components:   Color, Urine YELLOW (*)    APPearance HAZY (*)    All other components within normal limits  HEPATIC FUNCTION PANEL - Abnormal; Notable for the following components:   Indirect Bilirubin 1.0 (*)    All other components within normal limits  RESP PANEL BY RT-PCR (RSV, FLU A&B, COVID)  [id number]  URINE CULTURE  CULTURE, BLOOD (ROUTINE X 2)  CULTURE, BLOOD (ROUTINE X 2)  LIPASE, BLOOD  LACTIC ACID, PLASMA  D-DIMER, QUANTITATIVE  PROCALCITONIN  TSH  T4, FREE  MAGNESIUM   LACTIC ACID, PLASMA  COMPREHENSIVE METABOLIC PANEL  CBC  ETHANOL  HEMOGLOBIN A1C  TYPE AND SCREEN  TROPONIN I (HIGH SENSITIVITY)     EKG  ED ECG REPORT I, Oneil Budge, the attending physician, personally viewed and interpreted this ECG.  Date: 01/02/2024 EKG  Time: 1330 Rate: 90 Rhythm: normal sinus rhythm QRS Axis: normal Intervals: normal ST/T Wave abnormalities: normal Narrative Interpretation: no evidence of acute ischemia    RADIOLOGY  Chest x-ray inter by me is negative for acute finding    DG Chest 2 View Result Date: 01/02/2024 CLINICAL DATA:  Shortness of breath and bilateral chest pain EXAM: CHEST - 2 VIEW COMPARISON:  01/23/2023 FINDINGS: Thoracolumbar spine fixation. Midline trachea. Normal heart size and mediastinal contours for age. No pleural effusion or pneumothorax. Mild right hemidiaphragm elevation. Clear lungs. IMPRESSION: No active cardiopulmonary disease. Electronically Signed   By: Rockey Kilts M.D.   On: 01/02/2024 14:53      PROCEDURES:  Critical Care performed: No  Procedures   MEDICATIONS ORDERED IN ED: Medications  acetaminophen  (OFIRMEV ) IV 1,000 mg (0 mg Intravenous Stopped 01/02/24 1826)  venlafaxine  XR (EFFEXOR -XR) 24 hr capsule 150 mg (has no administration in time range)  sodium chloride  flush (NS) 0.9 % injection 3 mL (3 mLs Intravenous Given 01/02/24 2116)  acetaminophen  (TYLENOL ) tablet 650 mg (has no administration in time range)    Or  acetaminophen  (TYLENOL ) suppository 650 mg (has no administration in time range)  pantoprazole  (PROTONIX ) injection 40 mg (40 mg Intravenous Given 01/02/24 2153)  hydrocortisone  sodium succinate  (SOLU-CORTEF ) 100 MG injection 50 mg (50 mg Intravenous Given 01/02/24 2153)  ondansetron  (ZOFRAN -ODT) disintegrating tablet 4 mg (4 mg Oral Given 01/02/24 1348)  sodium chloride  0.9 % bolus 1,000 mL (0 mLs Intravenous Stopped 01/02/24 1826)  ondansetron  (ZOFRAN ) injection 4 mg (4 mg Intravenous Given 01/02/24 1721)  sodium chloride  0.9 % bolus 500 mL (0 mLs Intravenous Stopped 01/02/24 1909)  cefTRIAXone  (ROCEPHIN ) 1 g in sodium chloride  0.9 % 100 mL IVPB (0 g Intravenous Stopped 01/02/24 1844)  sodium chloride  0.9 % bolus 1,000 mL (1,000 mLs Intravenous New Bag/Given 01/02/24 2153)      IMPRESSION / MDM / ASSESSMENT AND PLAN / ED COURSE  I reviewed the triage vital signs and the nursing notes.                              Differential diagnosis includes, but is not limited to, bacteremia, viral syndrome, recurrent UTI, pneumonia, less likely and felt low risk for PE, D-dimer and that being exclusionary, etc.    Reviewed recent urine culture patient had E. coli infection with intermediate resistance to amoxicillin  which is what she was treated with.  Question if  she may have a partially treated urinary infection though at this time she denies any pain burning dysuria or obvious symptoms of UTI though given her fatigue mild nausea body aches as well as her negative viral and clear chest x-ray will exclude ongoing urinary infection and also sent for culture.  Overall her assessment is fairly reassuring but she does appear mildly ill, she is awake alert oriented.  No hypotension in the ER, but she does appear mildly dehydrated slightly elevated creatinine.  Will hydrate here, also obtain procalcitonin, D-dimer to exclude PE as this seems low risk but is having some pulmonary symptoms and cough otherwise unexplained at this point, etc.  We also entertain strongly this could be a viral infection outside of those that we have tested for today.          Vitals:   01/02/24 2045 01/02/24 2228  BP: (!) 96/42 (!) 90/53  Pulse: 88 86  Resp: 15 12  Temp:  97.8 F (36.6 C)  SpO2: 97% 99%     ----------------------------------------- 6:10 PM on 01/02/2024 ----------------------------------------- Patient's blood pressure now 107 systolic.  She is awake alert but still appears ill.  She also has a elevated fever 101.2.  I think given her recent UTI that was treated with Augmentin  that showed intermediate susceptibility that she may be bacteremic.  Sepsis likely developing.  Prior culture shows sensitivity to Rocephin  will start on Rocephin .  No obvious other source of  infection.  The patient and family at the bedside agreeable with plan for admission for further treatment, blood cultures being obtained now  Consulted with and patient accepted to hospitalist by Dr. Tobie  FINAL CLINICAL IMPRESSION(S) / ED DIAGNOSES   Final diagnoses:  SIRS (systemic inflammatory response syndrome) (HCC)  Fever, unspecified fever cause     Rx / DC Orders   ED Discharge Orders     None        Note:  This document was prepared using Dragon voice recognition software and may include unintentional dictation errors.   Dicky Anes, MD 01/02/24 2252

## 2024-01-02 NOTE — ED Provider Triage Note (Signed)
 Emergency Medicine Provider Triage Evaluation Note  Jody Brewer , a 73 y.o. female  was evaluated in triage.  Pt complains of chills, cough, chest pain, weakness, dizziness, shortness of breath, and migraine x 6 days.  Physical Exam  BP 110/75 (BP Location: Left Arm)   Pulse 96   Temp 98.9 F (37.2 C) (Oral)   Resp (!) 22   Ht 5' 5 (1.651 m)   Wt 77.1 kg   SpO2 100%   BMI 28.29 kg/m  Gen:   Awake, no distress   Resp:  Normal effort  MSK:   Moves extremities without difficulty  Other:    Medical Decision Making  Medically screening exam initiated at 2:42 PM.  Appropriate orders placed.  Jody Brewer was informed that the remainder of the evaluation will be completed by another provider, this initial triage assessment does not replace that evaluation, and the importance of remaining in the ED until their evaluation is complete.     Jody Kirk NOVAK, FNP 01/02/24 1521

## 2024-01-02 NOTE — ED Triage Notes (Signed)
 Pt sts that she has been having SOB with chills, dizziness, migraine and hypotension. Pt sts that she has been having these s/s for the last six days. Pt was also given augmentin recently for a UTI since May 2024.

## 2024-01-03 ENCOUNTER — Inpatient Hospital Stay
Admit: 2024-01-03 | Discharge: 2024-01-03 | Disposition: A | Discharge status: Home / Self Care | Payer: Medicare Other | Attending: Internal Medicine | Admitting: Internal Medicine

## 2024-01-03 ENCOUNTER — Encounter: Payer: Self-pay | Admitting: Internal Medicine

## 2024-01-03 DIAGNOSIS — R509 Fever, unspecified: Secondary | ICD-10-CM | POA: Diagnosis not present

## 2024-01-03 DIAGNOSIS — R42 Dizziness and giddiness: Secondary | ICD-10-CM | POA: Diagnosis not present

## 2024-01-03 DIAGNOSIS — J208 Acute bronchitis due to other specified organisms: Secondary | ICD-10-CM

## 2024-01-03 DIAGNOSIS — J069 Acute upper respiratory infection, unspecified: Secondary | ICD-10-CM | POA: Diagnosis present

## 2024-01-03 HISTORY — DX: Fever, unspecified: R50.9

## 2024-01-03 LAB — ECHOCARDIOGRAM COMPLETE
AR max vel: 2.66 cm2
AV Area VTI: 2.39 cm2
AV Area mean vel: 2.45 cm2
AV Mean grad: 7 mm[Hg]
AV Peak grad: 14.1 mm[Hg]
Ao pk vel: 1.88 m/s
Area-P 1/2: 5.23 cm2
Height: 65 in
MV VTI: 2.77 cm2
S' Lateral: 2.2 cm
Weight: 2720 [oz_av]

## 2024-01-03 LAB — RESPIRATORY PANEL BY PCR

## 2024-01-03 LAB — CBC
HCT: 24.7 % — ABNORMAL LOW (ref 36.0–46.0)
Hemoglobin: 8.4 g/dL — ABNORMAL LOW (ref 12.0–15.0)
MCH: 30.8 pg (ref 26.0–34.0)
MCHC: 34 g/dL (ref 30.0–36.0)
MCV: 90.5 fL (ref 80.0–100.0)
Platelets: 232 10*3/uL (ref 150–400)
RBC: 2.73 MIL/uL — ABNORMAL LOW (ref 3.87–5.11)
RDW: 14.2 % (ref 11.5–15.5)
WBC: 7.2 10*3/uL (ref 4.0–10.5)
nRBC: 0 % (ref 0.0–0.2)

## 2024-01-03 LAB — URINE CULTURE: Culture: NO GROWTH

## 2024-01-03 LAB — COMPREHENSIVE METABOLIC PANEL
ALT: 18 U/L (ref 0–44)
AST: 30 U/L (ref 15–41)
Albumin: 3 g/dL — ABNORMAL LOW (ref 3.5–5.0)
Alkaline Phosphatase: 55 U/L (ref 38–126)
Anion gap: 10 (ref 5–15)
BUN: 18 mg/dL (ref 8–23)
CO2: 20 mmol/L — ABNORMAL LOW (ref 22–32)
Calcium: 7.9 mg/dL — ABNORMAL LOW (ref 8.9–10.3)
Chloride: 110 mmol/L (ref 98–111)
Creatinine, Ser: 1.21 mg/dL — ABNORMAL HIGH (ref 0.44–1.00)
GFR, Estimated: 48 mL/min — ABNORMAL LOW (ref >60)
Glucose, Bld: 116 mg/dL — ABNORMAL HIGH (ref 70–99)
Potassium: 3.6 mmol/L (ref 3.5–5.1)
Sodium: 140 mmol/L (ref 135–145)
Total Bilirubin: 0.6 mg/dL (ref 0.0–1.2)
Total Protein: 6 g/dL — ABNORMAL LOW (ref 6.5–8.1)

## 2024-01-03 LAB — ETHANOL: Alcohol, Ethyl (B): <10 mg/dL (ref <10)

## 2024-01-03 LAB — LACTIC ACID, PLASMA: Lactic Acid, Venous: 0.8 mmol/L (ref 0.5–1.9)

## 2024-01-03 MED ORDER — ONDANSETRON HCL 4 MG/2ML IJ SOLN
4.0000 mg | Freq: Four times a day (QID) | INTRAMUSCULAR | Status: DC | PRN
  Administered 2024-01-03 – 2024-01-04 (×2): 4 mg via INTRAVENOUS
  Filled 2024-01-03 (×2): qty 2

## 2024-01-03 MED ORDER — GUAIFENESIN 100 MG/5ML PO LIQD
5.0000 mL | ORAL | Status: DC | PRN
  Administered 2024-01-03 – 2024-01-04 (×4): 5 mL via ORAL
  Filled 2024-01-03 (×4): qty 10

## 2024-01-03 MED ORDER — SODIUM CHLORIDE 0.9 % IV SOLN: INTRAVENOUS | Status: DC

## 2024-01-03 MED ORDER — SODIUM CHLORIDE 0.9 % IV BOLUS
500.0000 mL | Freq: Once | INTRAVENOUS | Status: AC
  Administered 2024-01-03: 500 mL via INTRAVENOUS

## 2024-01-03 MED ORDER — HYDROCODONE-ACETAMINOPHEN 5-325 MG PO TABS
1.0000 | ORAL_TABLET | Freq: Once | ORAL | Status: AC
  Administered 2024-01-03: 1 via ORAL
  Filled 2024-01-03: qty 1

## 2024-01-03 NOTE — ED Notes (Signed)
 Discussed BP management with floor coverage NP Jawo. Informed current BP 89/50. BP repositioned and taken x3.Current orders goals SBP >90 DBP >60.  Pt has ongoing IVF 75 ml/hr. Request repeat BP with pt sitting. BP 101/56 MAP 70. Also informed pt has ongoing dizziness. This is complaint that brought the pt in to the emergency department yesterday. Per NP, continue ongoing orders and medication. Goal MAP > 60. Pt and pts family at bedside informed of POC.

## 2024-01-03 NOTE — Progress Notes (Signed)
*  PRELIMINARY RESULTS* Echocardiogram 2D Echocardiogram has been performed.  Carolyne Fiscal 01/03/2024, 12:05 PM

## 2024-01-03 NOTE — ED Notes (Signed)
Informed Rn bed assigned  

## 2024-01-03 NOTE — Progress Notes (Addendum)
 Progress Note    Jody Brewer  FMW:[id number] DOB: Aug 21, 1951  DOA: 01/02/2024 PCP: Cleotilde Oneil FALCON, MD      Brief Narrative:    Medical records reviewed and are as summarized below:  Jody Brewer is a 73 y.o. female with medical history significant for asthma, history of COVID-19 infection requiring hospitalization, headache, hypertension, GERD, sleep apnea, recurrent UTIs (recently prescribed Augmentin  for UTI on 12/14/2023).  She presented to the hospital because of general body aches, fatigue, chills, dizziness, fever, dry cough, nausea, poor appetite.   She was febrile in the ED with temperature of 101.2 F and hypotensive with blood pressure down to 74/36.      Assessment/Plan:   Principal Problem:   Fever Active Problems:   AKI (acute kidney injury) (HCC)   Anemia   GERD (gastroesophageal reflux disease)   Multinodular goiter   Dizziness   Hypotension   Headache   URI (upper respiratory infection)    Body mass index is 28.29 kg/m.   Fever, myalgia, suspect acute upper respiratory tract infection, possible sepsis: SARS-CoV-2, influenza, RSV tests were all negative.  Respiratory viral panel has been ordered for further evaluation. Blood cultures pending. Robitussin as needed for cough, Tylenol  as needed for fever.   History of recurrent UTI, recent urine culture from 12/14/2023 showed E. coli that was treated with Augmentin : Urinalysis on this admission was unremarkable.  Urine culture did not show any growth.   Hypotension: Improved with IV fluids including boluses, maintenance IV fluids.  Monitor BP closely. Minoxidil and olmesartan -HCTZ have been held   AKI: Will repeat BMP.   Comorbidities include GERD, multinodular goiter, history of hypertension, headache, history of acute appendicitis s/p appendectomy June 2024    Diet Order             Diet Heart Room service appropriate? Yes; Fluid consistency: Thin  Diet effective now                             Consultants: None  Procedures: None    Medications:    hydrocortisone  sod succinate (SOLU-CORTEF ) inj  50 mg Intravenous Q8H   pantoprazole  (PROTONIX ) IV  40 mg Intravenous Q12H   sodium chloride  flush  3 mL Intravenous Q12H   venlafaxine  XR  150 mg Oral Q breakfast   Continuous Infusions:  sodium chloride  75 mL/hr at 01/03/24 0138   acetaminophen  Stopped (01/03/24 9367)     Anti-infectives (From admission, onward)    Start     Dose/Rate Route Frequency Ordered Stop   01/02/24 1815  cefTRIAXone  (ROCEPHIN ) 1 g in sodium chloride  0.9 % 100 mL IVPB        1 g 200 mL/hr over 30 Minutes Intravenous STAT 01/02/24 1806 01/02/24 1844              Family Communication/Anticipated D/C date and plan/Code Status   DVT prophylaxis:      Code Status: Full Code  Family Communication: Sarah, daughter, at the bedside Disposition Plan: Plan to discharge home   Status is: Inpatient Remains inpatient appropriate because: Fever, blood cultures pending       Subjective:   Interval events noted.  She complains of cough productive of yellowish sputum.  She also complains of lower abdominal pain and some burning urination.  Fever and body pains have improved.  Lauraine, daughter, was at the bedside  Objective:    Vitals:   01/03/24  0615 01/03/24 0627 01/03/24 0900 01/03/24 1003  BP: (!) 89/50 (!) 101/56 (!) 105/49   Pulse: 91 94 95   Resp: 16 20 16    Temp:    97.7 F (36.5 C)  TempSrc:      SpO2: 100% 100% 93%   Weight:      Height:       No data found.   Intake/Output Summary (Last 24 hours) at 01/03/2024 1237 Last data filed at 01/02/2024 1909 Gross per 24 hour  Intake 1665.83 ml  Output --  Net 1665.83 ml   Filed Weights   01/02/24 1322  Weight: 77.1 kg    Exam:  GEN: NAD SKIN: Warm and dry EYES: No pallor or icterus ENT: MMM CV: RRR PULM: CTA B ABD: soft, ND, NT, +BS CNS: AAO x 3, non focal EXT: No edema  or tenderness        Data Reviewed:   I have personally reviewed following labs and imaging studies:  Labs: Labs show the following:   Basic Metabolic Panel: Recent Labs  Lab 01/02/24 1345 01/02/24 1715  NA 138  --   K 3.5  --   CL 102  --   CO2 24  --   GLUCOSE 101*  --   BUN 22  --   CREATININE 1.33*  --   CALCIUM  9.2  --   MG  --  2.3   GFR Estimated Creatinine Clearance: 39.8 mL/min (A) (by C-G formula based on SCr of 1.33 mg/dL (H)). Liver Function Tests: Recent Labs  Lab 01/02/24 1715  AST 30  ALT 21  ALKPHOS 68  BILITOT 1.2  PROT 7.5  ALBUMIN 4.1   Recent Labs  Lab 01/02/24 1345  LIPASE 27   No results for input(s): AMMONIA in the last 168 hours. Coagulation profile No results for input(s): INR, PROTIME in the last 168 hours.  CBC: Recent Labs  Lab 01/02/24 1345  WBC 10.5  HGB 11.0*  HCT 32.1*  MCV 88.4  PLT 289   Cardiac Enzymes: No results for input(s): CKTOTAL, CKMB, CKMBINDEX, TROPONINI in the last 168 hours. BNP (last 3 results) No results for input(s): PROBNP in the last 8760 hours. CBG: No results for input(s): GLUCAP in the last 168 hours. D-Dimer: Recent Labs    01/02/24 1715  DDIMER 0.43   Hgb A1c: No results for input(s): HGBA1C in the last 72 hours. Lipid Profile: No results for input(s): CHOL, HDL, LDLCALC, TRIG, CHOLHDL, LDLDIRECT in the last 72 hours. Thyroid  function studies: Recent Labs    01/02/24 1715  TSH 2.235   Anemia work up: No results for input(s): VITAMINB12, FOLATE, FERRITIN, TIBC, IRON, RETICCTPCT in the last 72 hours. Sepsis Labs: Recent Labs  Lab 01/02/24 1345 01/02/24 1715 01/03/24 0133  PROCALCITON  --  0.15  --   WBC 10.5  --   --   LATICACIDVEN  --  1.2 0.8    Microbiology Recent Results (from the past 240 hours)  Resp panel by RT-PCR (RSV, Flu A&B, Covid) Anterior Nasal Swab     Status: None   Collection Time: 01/02/24  1:45 PM    Specimen: Anterior Nasal Swab  Result Value Ref Range Status   SARS Coronavirus 2 by RT PCR NEGATIVE NEGATIVE Final    Comment: (NOTE) SARS-CoV-2 target nucleic acids are NOT DETECTED.  The SARS-CoV-2 RNA is generally detectable in upper respiratory specimens during the acute phase of infection. The lowest concentration of SARS-CoV-2 viral copies this assay  can detect is 138 copies/mL. A negative result does not preclude SARS-Cov-2 infection and should not be used as the sole basis for treatment or other patient management decisions. A negative result may occur with  improper specimen collection/handling, submission of specimen other than nasopharyngeal swab, presence of viral mutation(s) within the areas targeted by this assay, and inadequate number of viral copies(<138 copies/mL). A negative result must be combined with clinical observations, patient history, and epidemiological information. The expected result is Negative.  Fact Sheet for Patients:  bloggercourse.com  Fact Sheet for Healthcare Providers:  seriousbroker.it  This test is no t yet approved or cleared by the [place]  FDA and  has been authorized for detection and/or diagnosis of SARS-CoV-2 by FDA under an Emergency Use Authorization (EUA). This EUA will remain  in effect (meaning this test can be used) for the duration of the COVID-19 declaration under Section 564(b)(1) of the Act, 21 U.S.C.section 360bbb-3(b)(1), unless the authorization is terminated  or revoked sooner.       Influenza A by PCR NEGATIVE NEGATIVE Final   Influenza B by PCR NEGATIVE NEGATIVE Final    Comment: (NOTE) The Xpert Xpress SARS-CoV-2/FLU/RSV plus assay is intended as an aid in the diagnosis of influenza from Nasopharyngeal swab specimens and should not be used as a sole basis for treatment. Nasal washings and aspirates are unacceptable for Xpert Xpress  SARS-CoV-2/FLU/RSV testing.  Fact Sheet for Patients: bloggercourse.com  Fact Sheet for Healthcare Providers: seriousbroker.it  This test is not yet approved or cleared by the [place]  FDA and has been authorized for detection and/or diagnosis of SARS-CoV-2 by FDA under an Emergency Use Authorization (EUA). This EUA will remain in effect (meaning this test can be used) for the duration of the COVID-19 declaration under Section 564(b)(1) of the Act, 21 U.S.C. section 360bbb-3(b)(1), unless the authorization is terminated or revoked.     Resp Syncytial Virus by PCR NEGATIVE NEGATIVE Final    Comment: (NOTE) Fact Sheet for Patients: bloggercourse.com  Fact Sheet for Healthcare Providers: seriousbroker.it  This test is not yet approved or cleared by the [place]  FDA and has been authorized for detection and/or diagnosis of SARS-CoV-2 by FDA under an Emergency Use Authorization (EUA). This EUA will remain in effect (meaning this test can be used) for the duration of the COVID-19 declaration under Section 564(b)(1) of the Act, 21 U.S.C. section 360bbb-3(b)(1), unless the authorization is terminated or revoked.  Performed at [facility], [address] [place], [place] [address]   Urine Culture     Status: None   Collection Time: 01/02/24  5:15 PM   Specimen: Urine, Clean Catch  Result Value Ref Range Status   Specimen Description   Final    URINE, CLEAN CATCH Performed at [facility], [address] [place], [place] [address]    Special Requests   Final    NONE Performed at [facility], [address] [place], [place] [address]    Culture   Final    NO GROWTH Performed at [facility], [address] [place], [place] [address]    Report Status 01/03/2024 FINAL  Final  Blood culture (routine x 2)     Status: None  (Preliminary result)   Collection Time: 01/02/24  6:26 PM   Specimen: BLOOD  Result Value Ref Range Status   Specimen Description BLOOD RIGHT ARM  Final   Special Requests   Final    BOTTLES DRAWN AEROBIC AND  ANAEROBIC Blood Culture adequate volume   Culture   Final    NO GROWTH < 24 HOURS Performed at [facility], [address] [place], [place] [address]    Report Status PENDING  Incomplete  Blood culture (routine x 2)     Status: None (Preliminary result)   Collection Time: 01/02/24  6:26 PM   Specimen: BLOOD  Result Value Ref Range Status   Specimen Description BLOOD LEFT ARM  Final   Special Requests   Final    BOTTLES DRAWN AEROBIC AND ANAEROBIC Blood Culture results may not be optimal due to an inadequate volume of blood received in culture bottles   Culture   Final    NO GROWTH < 24 HOURS Performed at [facility], [address], [place], [place] [address]    Report Status PENDING  Incomplete    Procedures and diagnostic studies:  CT HEAD WO CONTRAST ( ) Result Date: 01/02/2024 CLINICAL DATA:  Migraine headaches EXAM: CT HEAD WITHOUT CONTRAST TECHNIQUE: Contiguous axial images were obtained from the base of the skull through the vertex without intravenous contrast. RADIATION DOSE REDUCTION: This exam was performed according to the departmental dose-optimization program which includes automated exposure control, adjustment of the mA and/or kV according to patient size and/or use of iterative reconstruction technique. COMPARISON:  11/20/2021 FINDINGS: Brain: No evidence of acute infarction, hemorrhage, hydrocephalus, extra-axial collection or mass lesion/mass effect. Vascular: No hyperdense vessel or unexpected calcification. Skull: Normal. Negative for fracture or focal lesion. Sinuses/Orbits: Orbits and their contents are within normal limits. Paranasal sinuses demonstrate postsurgical changes with mild inflammatory changes in the right maxillary antrum.  Other: None. IMPRESSION: No acute intracranial abnormality noted. Mild inflammatory changes in the right maxillary antrum. Electronically Signed   By: Oneil Devonshire M.D.   On: 01/02/2024 22:12   DG Chest 2 View Result Date: 01/02/2024 CLINICAL DATA:  Shortness of breath and bilateral chest pain EXAM: CHEST - 2 VIEW COMPARISON:  01/23/2023 FINDINGS: Thoracolumbar spine fixation. Midline trachea. Normal heart size and mediastinal contours for age. No pleural effusion or pneumothorax. Mild right hemidiaphragm elevation. Clear lungs. IMPRESSION: No active cardiopulmonary disease. Electronically Signed   By: Rockey Kilts M.D.   On: 01/02/2024 14:53               LOS: 1 day   Blondie Riggsbee  [facility]   Pager on [email or web address]. If 7PM-7AM, please contact night-coverage at [email or web address]     01/03/2024, 12:37 PM

## 2024-01-03 NOTE — Consult Note (Addendum)
 NAME: Jody Brewer  DOB: 09-Sep-1951  MRN: [id number]  Date/Time: 01/03/2024 2:09 PM  REQUESTING PROVIDER: Dr.Patel Subjective:  REASON FOR CONSULT: UTI ? Jody Brewer is a 73 y.o. with a history of  HTN< OSA, asthma lumbar fusion surgery, Rt TKA, recurrent UTI since June 2024 presents with chills, night fever and sweats, weakness, cough chest pain Of few days duration   01/02/24 17:32  BP 94/57 !  Temp 101.2 F (38.4 C) !  Pulse Rate 99  Resp 15  SpO2 100 %    Latest Reference Range & Units 01/02/24 13:45  WBC 4.0 - 10.5 K/uL 10.5  Hemoglobin 12.0 - 15.0 g/dL 88.9 (L)  HCT 63.9 - 53.9 % 32.1 (L)  Platelets 150 - 400 K/uL 289  Creatinine 0.44 - 1.00 mg/dL 8.66 (H)   Blood culture sent UA sent N Urine culture sent She got a dose of Ceftriaxone  Also started on Iv hydrocortisone  CT head N CXR N  As per patient she has not been well in many months now- She had emergent appendectomy in June 2024 and ever since has had UTI which gave her urgency nocturia, some lower abdominal pain- seen urologist- had cystoscopy neg, post void neg,  She also has night seats, fever, fatigue x 6 months,  Now has cough which also is intermittent  She has been treated with multiple courses of antibiotics She says she can easily get dehydrated  In Nov 2022 following knee replacement she was on Opioids and was hospitalized  with encephalopathy due to excess intake  In Jan 2021 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  Past Medical History:  Diagnosis Date   Asthma    COVID-19 11/2019   hospitalized x2   GERD (gastroesophageal reflux disease)    Headache    Hypertension    Sleep apnea     Past Surgical History:  Procedure Laterality Date   ABDOMINAL HYSTERECTOMY     APPENDECTOMY     CATARACT EXTRACTION W/PHACO Left 06/10/2021   Procedure: CATARACT EXTRACTION PHACO AND INTRAOCULAR LENS PLACEMENT (IOC) LEFT;  Surgeon: Mittie Gaskin, MD;  Location:  [facility];  Service: Ophthalmology;  Laterality: Left;  5.69 0:53.6 10.6%   CATARACT EXTRACTION W/PHACO Right 06/24/2021   Procedure: CATARACT EXTRACTION PHACO AND INTRAOCULAR LENS PLACEMENT (IOC) RIGHT;  Surgeon: Mittie Gaskin, MD;  Location: [facility];  Service: Ophthalmology;  Laterality: Right;  3.61 00:52.4   CHOLECYSTECTOMY     ELBOW BURSA SURGERY Right    FRENULECTOMY, LINGUAL     and removal of portion of soft palate   LUMBAR DISC SURGERY  1996   L3-L5   LUMBAR DISC SURGERY  2004   LUMBAR FUSION  2006   bone harvest rods (2) and screws (36)   LUMBAR LAMINECTOMY  1998   L3-L5   MAXILLARY ANTROSTOMY Right 08/12/2022   Procedure: MAXILLARY ANTROSTOMY REVISION;  Surgeon: Edda Mt, MD;  Location: [facility];  Service: ENT;  Laterality: Right;  placed disk on OR charge nurse desk 8-8 kp   OOPHORECTOMY     1 ovary removed   TOTAL KNEE ARTHROPLASTY Right 11/17/2021   Procedure: TOTAL KNEE ARTHROPLASTY;  Surgeon: Edie Norleen PARAS, MD;  Location: [facility];  Service: Orthopedics;  Laterality: Right;   UVULOPALATOPHARYNGOPLASTY  2001   XI ROBOTIC LAPAROSCOPIC ASSISTED APPENDECTOMY N/A 05/31/2023   Procedure: XI ROBOTIC LAPAROSCOPIC ASSISTED APPENDECTOMY;  Surgeon: Desiderio Schanz, MD;  Location: [facility];  Service: General;  Laterality: N/A;  Social History   Socioeconomic History   Marital status: Legally Separated    Spouse name: Not on file   Number of children: Not on file   Years of education: Not on file   Highest education level: Not on file  Occupational History   Not on file  Tobacco Use   Smoking status: Never    Passive exposure: Never   Smokeless tobacco: Never  Vaping Use   Vaping status: Never Used  Substance and Sexual Activity   Alcohol use: Not Currently    Alcohol/week: 3.0 standard drinks of alcohol    Types: 3 Glasses of wine per week   Drug use: Not Currently   Sexual activity: Not on file  Other Topics Concern    Not on file  Social History Narrative   Not on file   Social Drivers of Health   Financial Resource Strain: Low Risk  (09/16/2023)   Received [facility]   Overall Financial Resource Strain (CARDIA)    Difficulty of Paying Living Expenses: Not hard at all  Food Insecurity: No Food Insecurity (09/16/2023)   Received from [facility]   Hunger Vital Sign    Worried About Running Out of Food in the Last Year: Never true    Ran Out of Food in the Last Year: Never true  Transportation Needs: No Transportation Needs (09/16/2023)   Received from [facility]    In the past 12 months, has lack of transportation kept you from medical appointments or from getting medications?: No    Lack of Transportation (Non-Medical): No  Physical Activity: Not on file  Stress: Not on file  Social Connections: Not on file  Intimate Partner Violence: Not At Risk (05/31/2023)   Humiliation, Afraid, Rape, and Kick questionnaire    Fear of Current or Ex-Partner: No    Emotionally Abused: No    Physically Abused: No    Sexually Abused: No    Family History  Problem Relation Age of Onset   Emphysema Mother    Parkinson's disease Father    Dementia Father    Hypertension Father    Breast cancer Neg Hx    Allergies  Allergen Reactions   Nirmatrelvir-Ritonavir Nausea Only     Severe nausea and vomiting.   Atorvastatin  Palpitations and Other (See Comments)    Muscle pain and severe twitching   Meloxicam Rash    Tolerates ibuprofen    I? Current Facility-Administered Medications  Medication Dose Route Frequency Provider Last Rate Last Admin   0.9 %  sodium chloride  infusion   Intravenous Continuous Tobie Mario GAILS, MD 75 mL/hr at 01/03/24 0138 New Bag at 01/03/24 0138   acetaminophen  (OFIRMEV ) IV 1,000 mg  1,000 mg Intravenous Q6H Quale, Mark, MD   Stopped at 01/03/24 9367   acetaminophen  (TYLENOL ) tablet 650 mg  650 mg  Oral Q6H PRN Patel, Ekta V, MD       Or   acetaminophen  (TYLENOL ) suppository 650 mg  650 mg Rectal Q6H PRN Patel, Ekta V, MD       guaiFENesin  (ROBITUSSIN) 100 MG/5ML liquid 5 mL  5 mL Oral Q4H PRN Jens Durand, MD       hydrocortisone  sodium succinate  (SOLU-CORTEF ) 100 MG injection 50 mg  50 mg Intravenous Q8H Patel, Ekta V, MD   50 mg at 01/03/24 9390   pantoprazole  (PROTONIX ) injection 40 mg  40 mg Intravenous Q12H Tobie Mario GAILS, MD  40 mg at 01/03/24 1003   sodium chloride  flush (NS) 0.9 % injection 3 mL  3 mL Intravenous Q12H Tobie Modest V, MD   3 mL at 01/02/24 2116   venlafaxine  XR (EFFEXOR -XR) 24 hr capsule 150 mg  150 mg Oral Q breakfast Patel, Ekta V, MD   150 mg at 01/03/24 1003   Current Outpatient Medications  Medication Sig Dispense Refill   albuterol  (VENTOLIN  HFA) 108 (90 Base) MCG/ACT inhaler Inhale 2 puffs into the lungs every 4 (four) hours as needed for wheezing or shortness of breath.     buPROPion  (WELLBUTRIN  XL) 150 MG 24 hr tablet Take 150 mg by mouth daily.     Cholecalciferol  (DIALYVITE VITAMIN D  5000) 125 MCG (5000 UT) capsule Take 5,000 Units by mouth daily.     estradiol  (ESTRACE ) 0.5 MG tablet Take 0.5 mg by mouth daily.     ferrous sulfate 325 (65 FE) MG EC tablet Take 325 mg by mouth. Mondays/Wednesday/friday     minoxidil (LONITEN) 2.5 MG tablet Take by mouth.     montelukast  (SINGULAIR ) 10 MG tablet Take 10 mg by mouth daily.     Multiple Vitamin (MULTI-VITAMIN) tablet Take 1 tablet by mouth daily.     olmesartan -hydrochlorothiazide  (BENICAR  HCT) 20-12.5 MG tablet Take 1 tablet by mouth daily.     pantoprazole  (PROTONIX ) 40 MG tablet Take 40 mg by mouth 2 (two) times daily.     venlafaxine  XR (EFFEXOR -XR) 150 MG 24 hr capsule Take 150 mg by mouth daily with breakfast.     vitamin E  400 UNIT capsule Take 400 Units by mouth 2 (two) times daily.     amoxicillin -clavulanate (AUGMENTIN ) 875-125 MG tablet Take 1 tablet by mouth every 12 (twelve) hours. (Patient  not taking: Reported on 01/03/2024) 14 tablet 0   estradiol  (ESTRACE ) 0.1 MG/GM vaginal cream Estrogen Cream Instruction Discard applicator Apply pea sized amount to tip of finger to urethra before bed. Wash hands well after application. Use Monday, Wednesday and Friday 42.5 g 12   gabapentin  (NEURONTIN ) 300 MG capsule Take 300 mg by mouth at bedtime.     ibuprofen  (ADVIL ) 800 MG tablet Take 1 tablet by mouth 2 (two) times daily as needed.     nitrofurantoin , macrocrystal-monohydrate, (MACROBID ) 100 MG capsule Take 1 capsule (100 mg total) by mouth daily. (Patient not taking: Reported on 01/03/2024) 90 capsule 0   nortriptyline  (PAMELOR ) 10 MG capsule Take 20 mg by mouth at bedtime.     zinc  sulfate 220 (50 Zn) MG capsule Take 220 mg by mouth daily. (Patient not taking: Reported on 01/03/2024)       Abtx:  Anti-infectives (From admission, onward)    Start     Dose/Rate Route Frequency Ordered Stop   01/02/24 1815  cefTRIAXone  (ROCEPHIN ) 1 g in sodium chloride  0.9 % 100 mL IVPB        1 g 200 mL/hr over 30 Minutes Intravenous STAT 01/02/24 1806 01/02/24 1844       REVIEW OF SYSTEMS:  Const:  fever, chills, night sweats negative weight loss Eyes: negative diplopia or visual changes, negative eye pain ENT: negative coryza, negative sore throat Resp:  cough,not productive no  hemoptysis, dyspnea Cards: negative for chest pain, palpitations, lower extremity edema GU: as above GI:IBD Negative for abdominal pain, diarrhea, bleeding, constipation Skin: negative for rash and pruritus Heme: negative for easy bruising and gum/nose bleeding MS: weakness, arthralgia- takes ibuprofen  800mg  every night Neurolo:+ headaches, dizziness, vertigo, memory problems  Psych:  anxiety, depression  Endocrine: negative for thyroid , diabetes Allergy/Immunology- as above Objective:  VITALS:  BP (!) 107/53   Pulse 89   Temp 97.7 F (36.5 C)   Resp 12   Ht 5' 5 (1.651 m)   Wt 77.1 kg   SpO2 100%   BMI  28.29 kg/m   PHYSICAL EXAM:  General: Alert, cooperative, no distress, appears stated age.  Head: Normocephalic, without obvious abnormality, atraumatic. Eyes: Conjunctivae clear, anicteric sclerae. Pupils are equal ENT Nares normal. No drainage or sinus tenderness.uvelectomy  Lips, mucosa, and tongue normal. No Thrush Teeth fine Neck: Supple, symmetrical, cervical  adenopathy left , thyroid : non tender no carotid bruit and no JVD. Back: No CVA tenderness. Lungs: Clear to auscultation bilaterally. No Wheezing or Rhonchi. No rales. Heart: Regular rate and rhythm, no murmur, rub or gallop. Abdomen: Soft, non-tender,not distended. Bowel sounds normal. No masses Extremities: atraumatic, no cyanosis. No edema. No clubbing Skin: No rashes or lesions. Or bruising Lymph: Cervical,adenopathy left 1.5 cm Neurologic: Grossly non-focal Pertinent Labs Lab Results CBC    Component Value Date/Time   WBC 7.2 01/03/2024 1326   RBC 2.73 (L) 01/03/2024 1326   HGB 8.4 (L) 01/03/2024 1326   HCT 24.7 (L) 01/03/2024 1326   PLT 232 01/03/2024 1326   MCV 90.5 01/03/2024 1326   MCH 30.8 01/03/2024 1326   MCHC 34.0 01/03/2024 1326   RDW 14.2 01/03/2024 1326   LYMPHSABS 3.3 07/15/2023 1231   MONOABS 0.8 07/15/2023 1231   EOSABS 0.0 07/15/2023 1231   BASOSABS 0.1 07/15/2023 1231       Latest Ref Rng & Units 01/03/2024    1:26 PM 01/02/2024    5:15 PM 01/02/2024    1:45 PM  CMP  Glucose 70 - 99 mg/dL 883   898   BUN 8 - 23 mg/dL 18   22   Creatinine 9.55 - 1.00 mg/dL 8.78   8.66   Sodium 864 - 145 mmol/L 140   138   Potassium 3.5 - 5.1 mmol/L 3.6   3.5   Chloride 98 - 111 mmol/L 110   102   CO2 22 - 32 mmol/L 20   24   Calcium  8.9 - 10.3 mg/dL 7.9   9.2   Total Protein 6.5 - 8.1 g/dL 6.0  7.5    Total Bilirubin 0.0 - 1.2 mg/dL 0.6  1.2    Alkaline Phos 38 - 126 U/L 55  68    AST 15 - 41 U/L 30  30    ALT 0 - 44 U/L 18  21        Microbiology: Recent Results (from the past 240 hours)   Resp panel by RT-PCR (RSV, Flu A&B, Covid) Anterior Nasal Swab     Status: None   Collection Time: 01/02/24  1:45 PM   Specimen: Anterior Nasal Swab  Result Value Ref Range Status   SARS Coronavirus 2 by RT PCR NEGATIVE NEGATIVE Final    Comment: (NOTE) SARS-CoV-2 target nucleic acids are NOT DETECTED.  The SARS-CoV-2 RNA is generally detectable in upper respiratory specimens during the acute phase of infection. The lowest concentration of SARS-CoV-2 viral copies this assay can detect is 138 copies/mL. A negative result does not preclude SARS-Cov-2 infection and should not be used as the sole basis for treatment or other patient management decisions. A negative result may occur with  improper specimen collection/handling, submission of specimen other than nasopharyngeal swab, presence of viral mutation(s) within the areas targeted by  this assay, and inadequate number of viral copies(<138 copies/mL). A negative result must be combined with clinical observations, patient history, and epidemiological information. The expected result is Negative.  Fact Sheet for Patients:  bloggercourse.com  Fact Sheet for Healthcare Providers:  seriousbroker.it  This test is no t yet approved or cleared by the [place]  FDA and  has been authorized for detection and/or diagnosis of SARS-CoV-2 by FDA under an Emergency Use Authorization (EUA). This EUA will remain  in effect (meaning this test can be used) for the duration of the COVID-19 declaration under Section 564(b)(1) of the Act, 21 U.S.C.section 360bbb-3(b)(1), unless the authorization is terminated  or revoked sooner.       Influenza A by PCR NEGATIVE NEGATIVE Final   Influenza B by PCR NEGATIVE NEGATIVE Final    Comment: (NOTE) The Xpert Xpress SARS-CoV-2/FLU/RSV plus assay is intended as an aid in the diagnosis of influenza from Nasopharyngeal swab specimens and should not be used  as a sole basis for treatment. Nasal washings and aspirates are unacceptable for Xpert Xpress SARS-CoV-2/FLU/RSV testing.  Fact Sheet for Patients: bloggercourse.com  Fact Sheet for Healthcare Providers: seriousbroker.it  This test is not yet approved or cleared by the [place]  FDA and has been authorized for detection and/or diagnosis of SARS-CoV-2 by FDA under an Emergency Use Authorization (EUA). This EUA will remain in effect (meaning this test can be used) for the duration of the COVID-19 declaration under Section 564(b)(1) of the Act, 21 U.S.C. section 360bbb-3(b)(1), unless the authorization is terminated or revoked.     Resp Syncytial Virus by PCR NEGATIVE NEGATIVE Final    Comment: (NOTE) Fact Sheet for Patients: bloggercourse.com  Fact Sheet for Healthcare Providers: seriousbroker.it  This test is not yet approved or cleared by the [place]  FDA and has been authorized for detection and/or diagnosis of SARS-CoV-2 by FDA under an Emergency Use Authorization (EUA). This EUA will remain in effect (meaning this test can be used) for the duration of the COVID-19 declaration under Section 564(b)(1) of the Act, 21 U.S.C. section 360bbb-3(b)(1), unless the authorization is terminated or revoked.  Performed at [facility], [address] [place], [place] [address]   Urine Culture     Status: None   Collection Time: 01/02/24  5:15 PM   Specimen: Urine, Clean Catch  Result Value Ref Range Status   Specimen Description   Final    URINE, CLEAN CATCH Performed at [facility], [address], [place], [place] [address]    Special Requests   Final    NONE Performed at [facility], [address], [place], [place] [address]    Culture   Final    NO GROWTH Performed at [facility], [address] [place], [place] [address]     Report Status 01/03/2024 FINAL  Final  Blood culture (routine x 2)     Status: None (Preliminary result)   Collection Time: 01/02/24  6:26 PM   Specimen: BLOOD  Result Value Ref Range Status   Specimen Description BLOOD RIGHT ARM  Final   Special Requests   Final    BOTTLES DRAWN AEROBIC AND ANAEROBIC Blood Culture adequate volume   Culture   Final    NO GROWTH < 24 HOURS Performed at [facility], [address] [place], [place] [address]    Report Status PENDING  Incomplete  Blood culture (routine x 2)     Status: None (Preliminary result)  Collection Time: 01/02/24  6:26 PM   Specimen: BLOOD  Result Value Ref Range Status   Specimen Description BLOOD LEFT ARM  Final   Special Requests   Final    BOTTLES DRAWN AEROBIC AND ANAEROBIC Blood Culture results may not be optimal due to an inadequate volume of blood received in culture bottles   Culture   Final    NO GROWTH < 24 HOURS Performed at [facility], [address] [place], [place] [address]    Report Status PENDING  Incomplete    IMAGING RESULTS: CXR no infiltrate I have personally reviewed the films ? Impression/Recommendation ?73 yr female presenting with cough of a few days duration, intermittent fever night sweats of 6 months Urinary symptoms intermittently with no significant improvement with many courses of antibiotics only to have candidiasis sand receieved fluconazole   Bronchitis- likely viral- neg resp viral panel   Intermittent fever of 6 months night time with sweats- B symptoms Check ferritin, LDH, ESR, CRP, 2 d echo ( to r/o endocarditis),  D.D is infection VS inflammation, autoimmune Vs B symptoms Pel ebstein fever Monitor fever here Recommend whole body imaging- may do PET SCAn or CT abdomen, pelvis, chest May need Autoimmune panel Wondered about adrenal insufficiency with sometimes low BP and dehydration Cortisol cannot be sent because she received Hydrocortisone  TSH N   Agree with not giving any antibiotics and waiting for blood culture and working her up  No evidence of UTI- would avoid antibiotics unless systemic illness, local symptoms and diagnostic findings Too many courses of antibiotic can upset the microbiome  Hypotension- pt has hypertension and on ACEI- because of low BP , dehydration intermittently may consider dose reduction  Anemia- recheck  H/o covid- could she have autonomic symptoms                ? Discussed wuth patient and her daughters  Note:  This document was prepared using Dragon voice recognition software and may include unintentional dictation errors.

## 2024-01-04 ENCOUNTER — Inpatient Hospital Stay: Payer: Medicare Other

## 2024-01-04 DIAGNOSIS — N179 Acute kidney failure, unspecified: Secondary | ICD-10-CM

## 2024-01-04 DIAGNOSIS — R509 Fever, unspecified: Secondary | ICD-10-CM

## 2024-01-04 DIAGNOSIS — J208 Acute bronchitis due to other specified organisms: Secondary | ICD-10-CM | POA: Diagnosis not present

## 2024-01-04 LAB — C-REACTIVE PROTEIN: CRP: 6.3 mg/dL — ABNORMAL HIGH (ref <1.0)

## 2024-01-04 LAB — BASIC METABOLIC PANEL
Anion gap: 8 (ref 5–15)
BUN: 19 mg/dL (ref 8–23)
CO2: 25 mmol/L (ref 22–32)
Calcium: 8.5 mg/dL — ABNORMAL LOW (ref 8.9–10.3)
Chloride: 113 mmol/L — ABNORMAL HIGH (ref 98–111)
Creatinine, Ser: 1.05 mg/dL — ABNORMAL HIGH (ref 0.44–1.00)
GFR, Estimated: 57 mL/min — ABNORMAL LOW (ref >60)
Glucose, Bld: 89 mg/dL (ref 70–99)
Potassium: 3.3 mmol/L — ABNORMAL LOW (ref 3.5–5.1)
Sodium: 146 mmol/L — ABNORMAL HIGH (ref 135–145)

## 2024-01-04 LAB — CBC
HCT: 26.1 % — ABNORMAL LOW (ref 36.0–46.0)
Hemoglobin: 8.6 g/dL — ABNORMAL LOW (ref 12.0–15.0)
MCH: 30.4 pg (ref 26.0–34.0)
MCHC: 33 g/dL (ref 30.0–36.0)
MCV: 92.2 fL (ref 80.0–100.0)
Platelets: 206 10*3/uL (ref 150–400)
RBC: 2.83 MIL/uL — ABNORMAL LOW (ref 3.87–5.11)
RDW: 14.4 % (ref 11.5–15.5)
WBC: 5.8 10*3/uL (ref 4.0–10.5)
nRBC: 0 % (ref 0.0–0.2)

## 2024-01-04 LAB — LACTATE DEHYDROGENASE: LDH: 99 U/L (ref 98–192)

## 2024-01-04 LAB — HEMOGLOBIN A1C
Hgb A1c MFr Bld: 5.7 % — ABNORMAL HIGH (ref 4.8–5.6)
Mean Plasma Glucose: 117 mg/dL

## 2024-01-04 LAB — SEDIMENTATION RATE: Sed Rate: 53 mm/h — ABNORMAL HIGH (ref 0–30)

## 2024-01-04 LAB — FERRITIN: Ferritin: 134 ng/mL (ref 11–307)

## 2024-01-04 MED ORDER — POLYETHYLENE GLYCOL 3350 17 G PO PACK
17.0000 g | PACK | Freq: Every day | ORAL | Status: DC
  Administered 2024-01-04 – 2024-01-08 (×5): 17 g via ORAL
  Filled 2024-01-04 (×6): qty 1

## 2024-01-04 MED ORDER — IOHEXOL 300 MG/ML  SOLN
100.0000 mL | Freq: Once | INTRAMUSCULAR | Status: AC | PRN
  Administered 2024-01-04: 100 mL via INTRAVENOUS

## 2024-01-04 MED ORDER — ALBUTEROL SULFATE (2.5 MG/3ML) 0.083% IN NEBU
2.5000 mg | INHALATION_SOLUTION | RESPIRATORY_TRACT | Status: DC | PRN
  Administered 2024-01-04 – 2024-01-05 (×2): 2.5 mg via RESPIRATORY_TRACT
  Filled 2024-01-04 (×2): qty 3

## 2024-01-04 MED ORDER — POTASSIUM CHLORIDE CRYS ER 20 MEQ PO TBCR
40.0000 meq | EXTENDED_RELEASE_TABLET | Freq: Once | ORAL | Status: AC
  Administered 2024-01-04: 40 meq via ORAL
  Filled 2024-01-04: qty 2

## 2024-01-04 MED ORDER — IOHEXOL 9 MG/ML PO SOLN
500.0000 mL | ORAL | Status: AC
  Administered 2024-01-04 (×2): 500 mL via ORAL

## 2024-01-04 MED ORDER — HYDROCOD POLI-CHLORPHE POLI ER 10-8 MG/5ML PO SUER
5.0000 mL | Freq: Two times a day (BID) | ORAL | Status: DC | PRN
  Administered 2024-01-04 – 2024-01-05 (×2): 5 mL via ORAL
  Filled 2024-01-04 (×2): qty 5

## 2024-01-04 MED ORDER — ENOXAPARIN SODIUM 40 MG/0.4ML IJ SOSY
40.0000 mg | PREFILLED_SYRINGE | INTRAMUSCULAR | Status: DC
  Administered 2024-01-04 – 2024-01-08 (×5): 40 mg via SUBCUTANEOUS
  Filled 2024-01-04 (×5): qty 0.4

## 2024-01-04 MED ORDER — BENZONATATE 100 MG PO CAPS
200.0000 mg | ORAL_CAPSULE | Freq: Three times a day (TID) | ORAL | Status: DC
  Administered 2024-01-04 – 2024-01-09 (×15): 200 mg via ORAL
  Filled 2024-01-04 (×15): qty 2

## 2024-01-04 MED ORDER — SENNOSIDES-DOCUSATE SODIUM 8.6-50 MG PO TABS
1.0000 | ORAL_TABLET | Freq: Two times a day (BID) | ORAL | Status: DC
Start: 2024-01-04 — End: 2024-01-09
  Administered 2024-01-04 – 2024-01-09 (×9): 1 via ORAL
  Filled 2024-01-04 (×11): qty 1

## 2024-01-04 NOTE — Progress Notes (Signed)
 Date of Admission:  01/02/2024   Total days of antibiotics            ID: Jody Brewer is a 73 y.o. female  Principal Problem:   Fever Active Problems:   AKI (acute kidney injury) (HCC)   Anemia   GERD (gastroesophageal reflux disease)   Multinodular goiter   Dizziness   Hypotension   Headache   URI (upper respiratory infection)    Subjective: Doing okay No fever Has cough  Medications:   benzonatate   200 mg Oral TID   enoxaparin  (LOVENOX ) injection  40 mg Subcutaneous Q24H   pantoprazole  (PROTONIX ) IV  40 mg Intravenous Q12H   polyethylene glycol  17 g Oral Daily   potassium chloride   40 mEq Oral Once   senna-docusate  1 tablet Oral BID   sodium chloride  flush  3 mL Intravenous Q12H   venlafaxine  XR  150 mg Oral Q breakfast    Objective: Vital signs in last 24 hours: Patient Vitals for the past 24 hrs:  BP Temp Temp src Pulse Resp SpO2 Weight  01/04/24 1302 (!) 151/73 97.6 F (36.4 C) Oral 86 16 100 % --  01/04/24 0726 (!) 122/56 99 F (37.2 C) Oral 89 16 96 % --  01/04/24 0553 -- -- -- -- -- -- 82.5 kg  01/04/24 0518 (!) 113/59 98.3 F (36.8 C) -- 80 18 97 % --  01/03/24 2341 (!) 100/55 98.4 F (36.9 C) -- 83 20 97 % --  01/03/24 2053 (!) 128/59 98.3 F (36.8 C) Oral 92 20 96 % --  01/03/24 1600 (!) 106/50 97.9 F (36.6 C) Oral 94 18 99 % --  01/03/24 1400 (!) 107/53 -- -- 89 12 100 % --      PHYSICAL EXAM:  General: Alert, cooperative, no distress, appears stated age.  Head: Normocephalic, without obvious abnormality, atraumatic. Eyes: Conjunctivae clear, anicteric sclerae. Pupils are equal ENT Nares normal. No drainage or sinus tenderness. Lips, mucosa, and tongue normal. No Thrush Neck: Supple, symmetrical, no adenopathy, thyroid : non tender no carotid bruit and no JVD. Back: No CVA tenderness. Lungs:b/l air entry- few rhonchi. Heart: Regular rate and rhythm, no murmur, rub or gallop. Abdomen: Soft, non-tender,not distended. Bowel sounds  normal. No masses Extremities: atraumatic, no cyanosis. No edema. No clubbing Skin: No rashes or lesions. Or bruising Lymph: Cervical, supraclavicular normal. Neurologic: Grossly non-focal  Lab Results    Latest Ref Rng & Units 01/04/2024    6:09 AM 01/03/2024    1:26 PM 01/02/2024    1:45 PM  CBC  WBC 4.0 - 10.5 K/uL 5.8  7.2  10.5   Hemoglobin 12.0 - 15.0 g/dL 8.6  8.4  88.9   Hematocrit 36.0 - 46.0 % 26.1  24.7  32.1   Platelets 150 - 400 K/uL 206  232  289        Latest Ref Rng & Units 01/04/2024    6:09 AM 01/03/2024    1:26 PM 01/02/2024    5:15 PM  CMP  Glucose 70 - 99 mg/dL 89  883    BUN 8 - 23 mg/dL 19  18    Creatinine 9.55 - 1.00 mg/dL 8.94  8.78    Sodium 864 - 145 mmol/L 146  140    Potassium 3.5 - 5.1 mmol/L 3.3  3.6    Chloride 98 - 111 mmol/L 113  110    CO2 22 - 32 mmol/L 25  20    Calcium   8.9 - 10.3 mg/dL 8.5  7.9    Total Protein 6.5 - 8.1 g/dL  6.0  7.5   Total Bilirubin 0.0 - 1.2 mg/dL  0.6  1.2   Alkaline Phos 38 - 126 U/L  55  68   AST 15 - 41 U/L  30  30   ALT 0 - 44 U/L  18  21       Microbiology: BC- NG UC- NG Studies/Results: ECHOCARDIOGRAM COMPLETE Result Date: 01/03/2024    ECHOCARDIOGRAM REPORT   Patient Name:   Jody Brewer Date of Exam: 01/03/2024 Medical Rec #:  [id number]          Height:       65.0 in Accession #:    [id number]         Weight:       170.0 lb Date of Birth:  1951/02/05         BSA:          1.846 m Patient Age:    72 years           BP:           105/49 mmHg Patient Gender: F                  HR:           95 bpm. Exam Location:  [facility] Procedure: 2D Echo, Cardiac Doppler and Color Doppler Indications:     Dizziness, Shortness of Breath  History:         Patient has prior history of Echocardiogram examinations, most                  recent 01/16/2020. Signs/Symptoms:Dizziness/Lightheadedness,                  Shortness of Breath and Chest Pain; Risk Factors:Hypertension,                  Dyslipidemia and Sleep Apnea.  Sonographer:      Naomie Reef Referring Phys:  [id number] MARIO GAILS PATEL Diagnosing Phys: Marsa Dooms MD  Sonographer Comments: Image acquisition challenging due to respiratory motion. IMPRESSIONS  1. Left ventricular ejection fraction, by estimation, is 65 to 70%. The left ventricle has normal function. The left ventricle has no regional wall motion abnormalities. Left ventricular diastolic parameters are consistent with Grade I diastolic dysfunction (impaired relaxation).  2. Right ventricular systolic function is normal. The right ventricular size is normal. There is normal pulmonary artery systolic pressure.  3. The mitral valve is normal in structure. Trivial mitral valve regurgitation. No evidence of mitral stenosis.  4. The aortic valve is normal in structure. Aortic valve regurgitation is not visualized. No aortic stenosis is present.  5. The inferior vena cava is normal in size with greater than 50% respiratory variability, suggesting right atrial pressure of 3 mmHg. FINDINGS  Left Ventricle: Left ventricular ejection fraction, by estimation, is 65 to 70%. The left ventricle has normal function. The left ventricle has no regional wall motion abnormalities. The left ventricular internal cavity size was normal in size. There is  no left ventricular hypertrophy. Left ventricular diastolic parameters are consistent with Grade I diastolic dysfunction (impaired relaxation). Right Ventricle: The right ventricular size is normal. No increase in right ventricular wall thickness. Right ventricular systolic function is normal. There is normal pulmonary artery systolic pressure. The tricuspid regurgitant velocity is 2.64 m/s, and  with an assumed right atrial pressure of 8 mmHg, the  estimated right ventricular systolic pressure is 35.9 mmHg. Left Atrium: Left atrial size was normal in size. Right Atrium: Right atrial size was normal in size. Pericardium: There is no evidence of pericardial effusion. Mitral Valve: The mitral  valve is normal in structure. Trivial mitral valve regurgitation. No evidence of mitral valve stenosis. MV peak gradient, 10.9 mmHg. The mean mitral valve gradient is 5.0 mmHg. Tricuspid Valve: The tricuspid valve is normal in structure. Tricuspid valve regurgitation is trivial. No evidence of tricuspid stenosis. Aortic Valve: The aortic valve is normal in structure. Aortic valve regurgitation is not visualized. No aortic stenosis is present. Aortic valve mean gradient measures 7.0 mmHg. Aortic valve peak gradient measures 14.1 mmHg. Aortic valve area, by VTI measures 2.39 cm. Pulmonic Valve: The pulmonic valve was normal in structure. Pulmonic valve regurgitation is not visualized. No evidence of pulmonic stenosis. Aorta: The aortic root is normal in size and structure. Venous: The inferior vena cava is normal in size with greater than 50% respiratory variability, suggesting right atrial pressure of 3 mmHg. IAS/Shunts: No atrial level shunt detected by color flow Doppler.  LEFT VENTRICLE PLAX 2D LVIDd:         3.70 cm   Diastology LVIDs:         2.20 cm   LV e' lateral:   14.10 cm/s LV PW:         1.20 cm   LV E/e' lateral: 9.1 LV IVS:        1.10 cm LVOT diam:     2.00 cm LV SV:         99 LV SV Index:   53 LVOT Area:     3.14 cm  RIGHT VENTRICLE RV Basal diam:  3.55 cm RV Mid diam:    2.90 cm RV S prime:     21.70 cm/s TAPSE (M-mode): 2.6 cm LEFT ATRIUM             Index        RIGHT ATRIUM           Index LA diam:        3.10 cm 1.68 cm/m   RA Area:     13.90 cm LA Vol (A2C):   54.0 ml 29.25 ml/m  RA Volume:   34.00 ml  18.42 ml/m LA Vol (A4C):   54.2 ml 29.36 ml/m LA Biplane Vol: 58.7 ml 31.80 ml/m  AORTIC VALVE                     PULMONIC VALVE AV Area (Vmax):    2.66 cm      PV Vmax:       1.17 m/s AV Area (Vmean):   2.45 cm      PV Peak grad:  5.5 mmHg AV Area (VTI):     2.39 cm AV Vmax:           188.00 cm/s AV Vmean:          125.000 cm/s AV VTI:            0.412 m AV Peak Grad:      14.1 mmHg  AV Mean Grad:      7.0 mmHg LVOT Vmax:         159.00 cm/s LVOT Vmean:        97.400 cm/s LVOT VTI:          0.314 m LVOT/AV VTI ratio: 0.76  AORTA Ao Root diam: 3.20 cm MITRAL  VALVE                TRICUSPID VALVE MV Area (PHT): 5.23 cm     TR Peak grad:   27.9 mmHg MV Area VTI:   2.77 cm     TR Vmax:        264.00 cm/s MV Peak grad:  10.9 mmHg MV Mean grad:  5.0 mmHg     SHUNTS MV Vmax:       1.65 m/s     Systemic VTI:  0.31 m MV Vmean:      102.0 cm/s   Systemic Diam: 2.00 cm MV Decel Time: 145 msec MV E velocity: 128.00 cm/s MV A velocity: 164.00 cm/s MV E/A ratio:  0.78 Marsa Dooms MD Electronically signed by Marsa Dooms MD Signature Date/Time: 01/03/2024/1:52:12 PM    Final    CT HEAD WO CONTRAST ( ) Result Date: 01/02/2024 CLINICAL DATA:  Migraine headaches EXAM: CT HEAD WITHOUT CONTRAST TECHNIQUE: Contiguous axial images were obtained from the base of the skull through the vertex without intravenous contrast. RADIATION DOSE REDUCTION: This exam was performed according to the departmental dose-optimization program which includes automated exposure control, adjustment of the mA and/or kV according to patient size and/or use of iterative reconstruction technique. COMPARISON:  11/20/2021 FINDINGS: Brain: No evidence of acute infarction, hemorrhage, hydrocephalus, extra-axial collection or mass lesion/mass effect. Vascular: No hyperdense vessel or unexpected calcification. Skull: Normal. Negative for fracture or focal lesion. Sinuses/Orbits: Orbits and their contents are within normal limits. Paranasal sinuses demonstrate postsurgical changes with mild inflammatory changes in the right maxillary antrum. Other: None. IMPRESSION: No acute intracranial abnormality noted. Mild inflammatory changes in the right maxillary antrum. Electronically Signed   By: Oneil Devonshire M.D.   On: 01/02/2024 22:12   DG Chest 2 View Result Date: 01/02/2024 CLINICAL DATA:  Shortness of breath and bilateral chest pain  EXAM: CHEST - 2 VIEW COMPARISON:  01/23/2023 FINDINGS: Thoracolumbar spine fixation. Midline trachea. Normal heart size and mediastinal contours for age. No pleural effusion or pneumothorax. Mild right hemidiaphragm elevation. Clear lungs. IMPRESSION: No active cardiopulmonary disease. Electronically Signed   By: Rockey Kilts M.D.   On: 01/02/2024 14:53     Assessment/Plan: 73 yr female presenting with cough of a few days duration, intermittent fever night sweats of 6 months Urinary symptoms intermittently with no significant improvement with many courses of antibiotics only to have candidiasis sand received fluconazole    Bronchitis- likely viral- neg resp viral panel     Intermittent fever of 6 months night time with sweats- B symptoms  ferritin ( N), LDH ( N), ESR ( 58), CRP, 2 d echo (no  endocarditis),  D.D is infection VS inflammation, autoimmune Vs B symptoms Pel ebstein fever Monitor fever -none in 24 hrs Recommend whole body imaging- may do PET SCAn or CT abdomen, pelvis, chest- done May need Autoimmune panel Wondered about adrenal insufficiency with sometimes low BP and dehydration Cortisol cannot be sent because she received Hydrocortisone  TSH N  Agree with not giving any antibiotics and waiting for blood culture and working her up   No evidence of UTI- would avoid antibiotics unless systemic illness, local symptoms and diagnostic findings Too many courses of antibiotic can upset the microbiome   Hypotension- pt has hypertension and on ACEI- because of low BP , dehydration intermittently may consider dose reduction   Anemia- recheck   H/o covid- could she have autonomic symptoms   H/o lumbar fusion surgery Rt TkA              ?  Discussed with patient and her daughters

## 2024-01-04 NOTE — Progress Notes (Signed)
 Progress Note   Patient: Jody Brewer FMW:[id number] DOB: 07-21-1951 DOA: 01/02/2024     2 DOS: the patient was seen and examined on 01/04/2024   Brief hospital course:  Jody Brewer is a 73 y.o. female with medical history significant for asthma, history of COVID-19 infection requiring hospitalization, headache, hypertension, GERD, sleep apnea, recurrent UTIs (recently prescribed Augmentin  for UTI on 12/14/2023).  She presented to the hospital because of general body aches, fatigue, chills, dizziness, fever, dry cough, nausea, poor appetite.    She was febrile in the ED with temperature of 101.2 F and hypotensive with blood pressure down to 74/36.  Patient was admitted for further evaluation and management of recurring fevers of unclear cause in addition to upper respiratory symptoms.   Infectious disease was consulted.  Further hospital course and management as outlined below.    Assessment and Plan:  Fevers of unknown etiology Myalgias, Dyspnea Acute URI Possible sepsis versus SIRS - thus far no infection source found.  SARS-CoV-2, influenza, RSV tests were negative.  Respiratory viral panel negative Blood cultures pending - no growth to date. 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  Chest x-ray is negative. --Infectious disease is following --CT's chest/abdomen/pelvis with contrast today --Quantiferon Gold is pending  --Robitussin as needed for cough --Add Tessalon  and Tussionex for cough --Tylenol  as needed for fever --Albuterol  nebs PRN     History of recurrent UTI, recent urine culture from 12/14/2023 showed E. coli that was treated with Augmentin : Urinalysis on this admission was unremarkable.  Urine culture no  growth.     Hypotension: Improved with IV fluids including boluses, maintenance IV fluids.   --Monitor BP closely. --Minoxidil and  olmesartan -HCTZ on hold     AKI: resolved with fluids. --Monitor BMP     Comorbidities include GERD, multinodular goiter, history of hypertension, headache, history of acute appendicitis s/p appendectomy June 2024      Subjective: Pt seen with son at bedside today.  Pt continues to have dry hacking cough and shortness of breath.  No fevers in over 24 hours, tmax this AM was 99 F.  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     Physical Exam: Vitals:   01/04/24 0553 01/04/24 0726 01/04/24 1302 01/04/24 1744  BP:  (!) 122/56 (!) 151/73 (!) 143/74  Pulse:  89 86 92  Resp:  16 16 18   Temp:  99 F (37.2 C) 97.6 F (36.4 C) 98.9 F (37.2 C)  TempSrc:  Oral Oral Oral  SpO2:  96% 100% 97%  Weight: 82.5 kg     Height:       General exam: awake, alert, no acute distress HEENT: moist mucus membranes, hearing grossly normal  Respiratory system: CTAB, conversational dyspnea, dry sounding cough triggered by deep inspiration, no wheezes, no rhonchi, on room air Cardiovascular system: normal S1/S2, RRR, no JVD, murmurs, rubs, gallops, no pedal edema.   Gastrointestinal system: soft, NT, ND, no HSM felt, +bowel sounds.  Central nervous system: A&O x 4. no gross focal neurologic deficits, normal speech Extremities: moves all, no edema, normal tone Skin: dry, intact, normal temperature Psychiatry: normal mood, congruent affect, judgement and insight appear normal  Data Reviewed:  Notable labs -- Na 146, K 3.3, Cl 113, Cr 1.05, CRP 6.3,  Hbg stable 8.6  LDH normal 99 Ferritin normal 134  Pending - Quantiferon gold, CT chest/abd/pelvis  Family Communication: son at bedside on rounds  Disposition: Status is: Inpatient Remains inpatient appropriate because: ongoing evaluation   Planned Discharge Destination: Home    Time spent:  42 minutes  Author: Burnard DELENA Cunning, DO 01/04/2024 6:42 PM  For on call review [email or web address].

## 2024-01-05 ENCOUNTER — Encounter: Payer: Self-pay | Admitting: Internal Medicine

## 2024-01-05 DIAGNOSIS — J8489 Other specified interstitial pulmonary diseases: Secondary | ICD-10-CM

## 2024-01-05 DIAGNOSIS — J208 Acute bronchitis due to other specified organisms: Secondary | ICD-10-CM | POA: Diagnosis not present

## 2024-01-05 DIAGNOSIS — J069 Acute upper respiratory infection, unspecified: Secondary | ICD-10-CM

## 2024-01-05 DIAGNOSIS — R509 Fever, unspecified: Secondary | ICD-10-CM | POA: Diagnosis not present

## 2024-01-05 LAB — BASIC METABOLIC PANEL
Anion gap: 9 (ref 5–15)
BUN: 12 mg/dL (ref 8–23)
CO2: 24 mmol/L (ref 22–32)
Calcium: 8.5 mg/dL — ABNORMAL LOW (ref 8.9–10.3)
Chloride: 106 mmol/L (ref 98–111)
Creatinine, Ser: 0.82 mg/dL (ref 0.44–1.00)
GFR, Estimated: >60 mL/min (ref >60)
Glucose, Bld: 79 mg/dL (ref 70–99)
Potassium: 4 mmol/L (ref 3.5–5.1)
Sodium: 139 mmol/L (ref 135–145)

## 2024-01-05 LAB — C-REACTIVE PROTEIN: CRP: 7.4 mg/dL — ABNORMAL HIGH (ref <1.0)

## 2024-01-05 LAB — MAGNESIUM: Magnesium: 2 mg/dL (ref 1.7–2.4)

## 2024-01-05 MED ORDER — SODIUM CHLORIDE 3 % IN NEBU
4.0000 mL | INHALATION_SOLUTION | Freq: Three times a day (TID) | RESPIRATORY_TRACT | Status: DC
  Administered 2024-01-05 – 2024-01-06 (×2): 4 mL via RESPIRATORY_TRACT
  Filled 2024-01-05 (×4): qty 4

## 2024-01-05 MED ORDER — SODIUM CHLORIDE 3 % IN NEBU: 4.0000 mL | INHALATION_SOLUTION | Freq: Three times a day (TID) | RESPIRATORY_TRACT | Status: DC | PRN

## 2024-01-05 MED ORDER — SODIUM CHLORIDE 0.9 % IV SOLN
3.0000 g | Freq: Four times a day (QID) | INTRAVENOUS | Status: DC
  Administered 2024-01-05 – 2024-01-06 (×3): 3 g via INTRAVENOUS
  Filled 2024-01-05 (×5): qty 8

## 2024-01-05 MED ORDER — CYCLOBENZAPRINE HCL 10 MG PO TABS: 5.0000 mg | ORAL_TABLET | Freq: Three times a day (TID) | ORAL | Status: DC | PRN

## 2024-01-05 MED ORDER — DEXAMETHASONE SODIUM PHOSPHATE 10 MG/ML IJ SOLN
8.0000 mg | INTRAMUSCULAR | Status: DC
Start: 2024-01-05 — End: 2024-01-09
  Administered 2024-01-05 – 2024-01-08 (×4): 8 mg via INTRAVENOUS
  Filled 2024-01-05 (×4): qty 1

## 2024-01-05 MED ORDER — KETOROLAC TROMETHAMINE 15 MG/ML IJ SOLN
15.0000 mg | Freq: Four times a day (QID) | INTRAMUSCULAR | Status: DC | PRN
  Administered 2024-01-05: 15 mg via INTRAVENOUS
  Filled 2024-01-05: qty 1

## 2024-01-05 NOTE — Plan of Care (Signed)

## 2024-01-05 NOTE — Care Management Important Message (Signed)
 Important Message  Patient Details  Name: Jody Brewer MRN: [id number] Date of Birth: 25-Aug-1951   Important Message Given:  Yes - Medicare IM     Cristela Blue, CMA 01/05/2024, 4:53 PM

## 2024-01-05 NOTE — Plan of Care (Signed)

## 2024-01-05 NOTE — Progress Notes (Signed)
 Progress Note   Patient: Jody Brewer FMW:[id number] DOB: [birth year] DOA: 01/02/2024     3 DOS: the patient was seen and examined on 01/05/2024   Brief hospital course:  Jody Brewer is a 73 y.o. female with medical history significant for asthma, history of COVID-19 infection requiring hospitalization, headache, hypertension, GERD, sleep apnea, recurrent UTIs (recently prescribed Augmentin  for UTI on 12/14/2023).  She presented to the hospital because of general body aches, fatigue, chills, dizziness, fever, dry cough, nausea, poor appetite.    She was febrile in the ED with temperature of 101.2 F and hypotensive with blood pressure down to 74/36.  Patient was admitted for further evaluation and management of recurring fevers of unclear cause in addition to upper respiratory symptoms.   Infectious disease was consulted.  Further hospital course and management as outlined below.    Assessment and Plan:  Fevers of unknown etiology Myalgias, Dyspnea Acute URI Possible sepsis versus SIRS - thus far no infection source found.  SARS-CoV-2, influenza, RSV tests were negative.  Respiratory viral panel negative Blood cultures pending - no growth to date. 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  Chest x-ray is negative. --Infectious disease is following -- follow up recommendations --Pulmonology consulted based on chest CT findings --RT for sputum induction -- will collect 3 sputums for AFB --CT's chest/abdomen/pelvis with contrast today --Quantiferon Gold is pending  --Robitussin as needed for cough --Add Tessalon  and Tussionex for cough --Tylenol  as needed for fever --Albuterol  nebs PRN     History of recurrent UTI, recent urine culture from 12/14/2023 showed E. coli that was treated with Augmentin : Urinalysis on this admission was unremarkable.  Urine culture no   growth.     Hypotension: Improved with IV fluids including boluses, maintenance IV fluids.   --Monitor BP closely. --Minoxidil and olmesartan -HCTZ on hold     AKI: resolved with fluids. --Monitor BMP     Comorbidities include GERD, multinodular goiter, history of hypertension, headache, history of acute appendicitis s/p appendectomy June 2024      Subjective: Pt seen with son at bedside today.  We discussed her CT results and next steps in evaluation.  She reports breathing is better today. Continues to have dry cough.  No fevers overnight.   Physical Exam: Vitals:   01/04/24 2356 01/05/24 0443 01/05/24 0744 01/05/24 0905  BP: 122/67 123/64 127/66   Pulse: 96 95 81 69  Resp: 18 20 16    Temp: 98.3 F (36.8 C) 99.9 F (37.7 C) 98.4 F (36.9 C)   TempSrc:  Oral    SpO2: 100% 93% 93% 100%  Weight:      Height:       General exam: awake, alert, no acute distress HEENT: moist mucus membranes, hearing grossly normal  Respiratory system: intermittent right-sided crackles, left side clear, on room air Cardiovascular system: normal S1/S2, RRR Gastrointestinal system: soft, NT, ND Central nervous system: A&O x 4. no gross focal neurologic deficits, normal speech Extremities: moves all, no edema, normal tone Skin: dry, intact, normal temperature Psychiatry: normal mood, congruent affect, judgement and insight appear normal   Data Reviewed:  Notable labs -- Normal BMP except Ca 8.5   LDH normal 99 Ferritin normal 134  Pending - Quantiferon gold  CT chest/abd/pelvis --- see report -- large subsolid opacity RUL and additional subpleural in dependent RLL -- infectious vs  inflammatory (vs malignancy)   Family Communication: son at bedside on rounds, daughter on speaker phone during rounds  Disposition: Status is: Inpatient Remains inpatient appropriate because: ongoing evaluation   Planned Discharge Destination: Home    Time spent: 45 minutes  Author: Burnard DELENA Cunning, DO 01/05/2024 5:43 PM  For on call review [email or web address].

## 2024-01-05 NOTE — Progress Notes (Signed)
 Date of Admission:  01/02/2024   Total days of antibiotics            ID: Jody Brewer is a 73 y.o. female  Principal Problem:   Fever Active Problems:   AKI (acute kidney injury) (HCC)   Anemia   GERD (gastroesophageal reflux disease)   Multinodular goiter   Dizziness   Hypotension   Headache   URI (upper respiratory infection)    Subjective: Still coughing but not productive   Medications:   benzonatate   200 mg Oral TID   dexamethasone  (DECADRON ) injection  8 mg Intravenous Q24H   enoxaparin  (LOVENOX ) injection  40 mg Subcutaneous Q24H   pantoprazole  (PROTONIX ) IV  40 mg Intravenous Q12H   polyethylene glycol  17 g Oral Daily   senna-docusate  1 tablet Oral BID   sodium chloride  flush  3 mL Intravenous Q12H   sodium chloride  HYPERTONIC  4 mL Nebulization Q8H   venlafaxine  XR  150 mg Oral Q breakfast    Objective: Vital signs in last 24 hours: Patient Vitals for the past 24 hrs:  BP Temp Temp src Pulse Resp SpO2  01/05/24 2025 (!) 140/72 98.4 F (36.9 C) -- 76 20 94 %  01/05/24 0905 -- -- -- 69 -- 100 %  01/05/24 0744 127/66 98.4 F (36.9 C) -- 81 16 93 %  01/05/24 0443 123/64 99.9 F (37.7 C) Oral 95 20 93 %  01/04/24 2356 122/67 98.3 F (36.8 C) -- 96 18 100 %      PHYSICAL EXAM:  General: Alert, cooperative, no distress, appears stated age.  Head: Normocephalic, without obvious abnormality, atraumatic. Eyes: Conjunctivae clear, anicteric sclerae. Pupils are equal ENT Nares normal. No drainage or sinus tenderness. Lips, mucosa, and tongue normal. No Thrush Neck: Supple, symmetrical, no adenopathy, thyroid : non tender no carotid bruit and no JVD. Back: No CVA tenderness. Lungs:b/l air entry- few rhonchi.b/l Heart: Regular rate and rhythm, no murmur, rub or gallop. Abdomen: Soft, non-tender,not distended. Bowel sounds normal. No masses Extremities: atraumatic, no cyanosis. No edema. No clubbing Skin: No rashes or lesions. Or bruising Lymph:  Cervical, supraclavicular normal. Neurologic: Grossly non-focal  Lab Results    Latest Ref Rng & Units 01/04/2024    6:09 AM 01/03/2024    1:26 PM 01/02/2024    1:45 PM  CBC  WBC 4.0 - 10.5 K/uL 5.8  7.2  10.5   Hemoglobin 12.0 - 15.0 g/dL 8.6  8.4  88.9   Hematocrit 36.0 - 46.0 % 26.1  24.7  32.1   Platelets 150 - 400 K/uL 206  232  289        Latest Ref Rng & Units 01/05/2024    5:54 AM 01/04/2024    6:09 AM 01/03/2024    1:26 PM  CMP  Glucose 70 - 99 mg/dL 79  89  883   BUN 8 - 23 mg/dL 12  19  18    Creatinine 0.44 - 1.00 mg/dL 9.17  8.94  8.78   Sodium 135 - 145 mmol/L 139  146  140   Potassium 3.5 - 5.1 mmol/L 4.0  3.3  3.6   Chloride 98 - 111 mmol/L 106  113  110   CO2 22 - 32 mmol/L 24  25  20    Calcium  8.9 - 10.3 mg/dL 8.5  8.5  7.9   Total Protein 6.5 - 8.1 g/dL   6.0   Total Bilirubin 0.0 - 1.2 mg/dL   0.6  Alkaline Phos 38 - 126 U/L   55   AST 15 - 41 U/L   30   ALT 0 - 44 U/L   18       Microbiology: BC- NG UC- NG     Studies/Results: CT CHEST ABDOMEN PELVIS W CONTRAST Result Date: 01/04/2024 CLINICAL DATA:  Fevers of unknown origin for 6 months * Tracking Code: BO * EXAM: CT CHEST, ABDOMEN, AND PELVIS WITH CONTRAST TECHNIQUE: Multidetector CT imaging of the chest, abdomen and pelvis was performed following the standard protocol during bolus administration of intravenous contrast. RADIATION DOSE REDUCTION: This exam was performed according to the departmental dose-optimization program which includes automated exposure control, adjustment of the mA and/or kV according to patient size and/or use of iterative reconstruction technique. CONTRAST:  OMNIPAQUE  IOHEXOL  300 MG/ML SOLN additional oral enteric contrast COMPARISON:  CT abdomen pelvis, 07/15/2023 FINDINGS: CT CHEST FINDINGS Cardiovascular: No significant vascular findings. Normal heart size. No pericardial effusion. Mediastinum/Nodes: No enlarged mediastinal, hilar, or axillary lymph nodes. Small hiatal hernia  thyroid  gland, trachea, and esophagus demonstrate no significant findings. Lungs/Pleura: Large, irregular subsolid opacity of the posterior right upper lobe measuring (series 4, image 67) as well as an additional large irregular subpleural opacity of the dependent right lower lobe measuring 5.6 x 5.4 cm (series 4, image 76). Multiple small right pulmonary nodules, for example in the right lung base measuring 0.5 cm (series 4, image 42) and in the anterior right upper lobe measuring 0.7 cm (series 4, image 42). Small bilateral pleural effusions. Musculoskeletal: No chest wall abnormality. No acute osseous findings. CT ABDOMEN PELVIS FINDINGS Hepatobiliary: No focal liver abnormality is seen. Status post cholecystectomy. Postoperative biliary ductal dilatation. Pancreas: Unremarkable. No pancreatic ductal dilatation or surrounding inflammatory changes. Spleen: Normal in size without significant abnormality. Adrenals/Urinary Tract: Adrenal glands are unremarkable. Kidneys are normal, without renal calculi, solid lesion, or hydronephrosis. Bladder is unremarkable. Stomach/Bowel: Stomach is within normal limits. Status post appendectomy. No evidence of bowel wall thickening, distention, or inflammatory changes. Sigmoid diverticula Vascular/Lymphatic: Aortic atherosclerosis. No enlarged abdominal or pelvic lymph nodes. Reproductive: Status post hysterectomy. Other: No abdominal wall hernia or abnormality. No ascites. Musculoskeletal: No acute osseous findings. Posterior thoracolumbar fusion T9-S1. IMPRESSION: 1. Large, irregular subsolid opacity of the posterior right upper lobe measuring as well as an additional large irregular subpleural opacity of the dependent right lower lobe. These are in general most likely to be infectious or inflammatory, general differential considerations including unusual etiology such as eosinophilic or organizing pneumonias, however given morphology and unilateral distribution, infiltrative  adenocarcinoma is distinctly not excluded. 2. Multiple small right pulmonary nodules measuring up to 0.7 cm, nonspecific. Attention on follow-up. 3. Small bilateral pleural effusions. 4. No evidence of lymphadenopathy or metastatic disease in the abdomen or pelvis. 5. Status post cholecystectomy, appendectomy, and hysterectomy. 6. Sigmoid diverticulosis without evidence of acute diverticulitis. Aortic Atherosclerosis (ICD10-I70.0). Electronically Signed   By: Marolyn JONETTA Jaksch M.D.   On: 01/04/2024 20:54     Assessment/Plan: 73 yr female presenting with cough of a few days duration, intermittent fever night sweats of 6 months Urinary symptoms intermittently with no significant improvement with many courses of antibiotics only to have candidiasis sand received fluconazole    Bronchitis- likely viral- neg resp viral panel     Intermittent fever of 6 months night time with sweats- B symptoms  ferritin ( N), LDH ( N), ESR ( 58), CRP, 2 d echo (no  endocarditis),  D.D is infection VS inflammation, autoimmune  Vs B symptoms Pel ebstein fever Monitor fever - CT chest showed subsolid opacities X 2 on the rt side of significant size Because of past exposure to TB, and fFUO will get 3 sputum for [employer] Though CT is not typical for TB Will also start unasyn  for possible aspiration pneumonia Can switch to augmentin  soon  May need Autoimmune panel Wondered about adrenal insufficiency with sometimes low BP and dehydration Cortisol cannot be sent because she received Hydrocortisone  TSH N  Agree with not giving any antibiotics and waiting for blood culture and working her up   No evidence of UTI- would avoid antibiotics unless systemic illness, local symptoms and diagnostic findings Too many courses of antibiotic can upset the microbiome   Hypotension- pt has hypertension and on ACEI- because of low BP , dehydration intermittently may consider dose reduction   Anemia- recheck   H/o covid- could she have  autonomic symptoms   H/o lumbar fusion surgery Rt TkA    Airborne isolation Induce sputum with hypertonic saline X3  Spoke to RT           ? Discussed with patient and her nurse, RT , pulmonologist and hospitalist

## 2024-01-05 NOTE — Progress Notes (Signed)
 Sputum induction done x 1. Pt unable to cough up sputum at this time.

## 2024-01-05 NOTE — Consult Note (Signed)
 PULMONOLOGY         Date: 01/05/2024,   MRN# [id number] Jody Brewer [birth year]     AdmissionWeight: 77.1 kg                 CurrentWeight: 82.5 kg  Referring provider: Dr Fausto   CHIEF COMPLAINT:   Abnormal CT chest    HISTORY OF PRESENT ILLNESS   This is a 73yo F with PMH of essential HTN, AKI, GERD, recurrent UTI, goiter, anemia, OSA who came in with DOE and SOB associated with presyncope, hypotension and disequilibrium.  She also has recurrent urinary tract infection with sepsis. She was noted to have fever and tachycardia on arrival to ER.  She had abnormal CT chest with findings of subsolid opacification of RLL and RUL and small nodules , all on right lung. She has not had cancer in the past but had stage 4/5 pap smear at young age and opted for hysterctomy..  She endorses heavy smoke second had exposure but never smoked herself.    PAST MEDICAL HISTORY   Past Medical History:  Diagnosis Date   Asthma    COVID-19 11/2019   hospitalized x2   GERD (gastroesophageal reflux disease)    Headache    Hypertension    Sleep apnea      SURGICAL HISTORY   Past Surgical History:  Procedure Laterality Date   ABDOMINAL HYSTERECTOMY     APPENDECTOMY     CATARACT EXTRACTION W/PHACO Left 06/10/2021   Procedure: CATARACT EXTRACTION PHACO AND INTRAOCULAR LENS PLACEMENT (IOC) LEFT;  Surgeon: Mittie Gaskin, MD;  Location: [facility];  Service: Ophthalmology;  Laterality: Left;  5.69 0:53.6 10.6%   CATARACT EXTRACTION W/PHACO Right 06/24/2021   Procedure: CATARACT EXTRACTION PHACO AND INTRAOCULAR LENS PLACEMENT (IOC) RIGHT;  Surgeon: Mittie Gaskin, MD;  Location: [facility];  Service: Ophthalmology;  Laterality: Right;  3.61 00:52.4   CHOLECYSTECTOMY     ELBOW BURSA SURGERY Right    FRENULECTOMY, LINGUAL     and removal of portion of soft palate   LUMBAR DISC SURGERY  1996   L3-L5   LUMBAR DISC SURGERY  2004   LUMBAR FUSION   2006   bone harvest rods (2) and screws (36)   LUMBAR LAMINECTOMY  1998   L3-L5   MAXILLARY ANTROSTOMY Right 08/12/2022   Procedure: MAXILLARY ANTROSTOMY REVISION;  Surgeon: Edda Mt, MD;  Location: [facility];  Service: ENT;  Laterality: Right;  placed disk on OR charge nurse desk 8-8 kp   OOPHORECTOMY     1 ovary removed   TOTAL KNEE ARTHROPLASTY Right 11/17/2021   Procedure: TOTAL KNEE ARTHROPLASTY;  Surgeon: Edie Norleen PARAS, MD;  Location: [facility];  Service: Orthopedics;  Laterality: Right;   UVULOPALATOPHARYNGOPLASTY  2001   XI ROBOTIC LAPAROSCOPIC ASSISTED APPENDECTOMY N/A 05/31/2023   Procedure: XI ROBOTIC LAPAROSCOPIC ASSISTED APPENDECTOMY;  Surgeon: Desiderio Schanz, MD;  Location: [facility];  Service: General;  Laterality: N/A;     FAMILY HISTORY   Family History  Problem Relation Age of Onset   Emphysema Mother    Parkinson's disease Father    Dementia Father    Hypertension Father    Breast cancer Neg Hx      SOCIAL HISTORY   Social History   Tobacco Use   Smoking status: Never    Passive exposure: Never   Smokeless tobacco: Never  Vaping Use   Vaping status: Never Used  Substance Use Topics  Alcohol use: Not Currently    Alcohol/week: 3.0 standard drinks of alcohol    Types: 3 Glasses of wine per week   Drug use: Not Currently     MEDICATIONS    Home Medication:    Current Medication:  Current Facility-Administered Medications:    acetaminophen  (TYLENOL ) tablet 650 mg, 650 mg, Oral, Q6H PRN, 650 mg at 01/05/24 0503 **OR** acetaminophen  (TYLENOL ) suppository 650 mg, 650 mg, Rectal, Q6H PRN, Tobie Mario GAILS, MD   albuterol  (PROVENTIL ) (2.5 MG/3ML) 0.083% nebulizer solution 2.5 mg, 2.5 mg, Nebulization, Q4H PRN, Fausto Sor A, DO, 2.5 mg at 01/04/24 2139   benzonatate  (TESSALON ) capsule 200 mg, 200 mg, Oral, TID, Fausto Sor A, DO, 200 mg at 01/05/24 0910   chlorpheniramine-HYDROcodone  (TUSSIONEX) 10-8 MG/5ML suspension 5 mL, 5 mL,  Oral, Q12H PRN, Fausto Sor A, DO, 5 mL at 01/05/24 0018   cyclobenzaprine  (FLEXERIL ) tablet 5 mg, 5 mg, Oral, TID PRN, Fausto Sor A, DO   enoxaparin  (LOVENOX ) injection 40 mg, 40 mg, Subcutaneous, Q24H, Tobie Cathaleen RAMAN, RPH, 40 mg at 01/04/24 2133   guaiFENesin  (ROBITUSSIN) 100 MG/5ML liquid 5 mL, 5 mL, Oral, Q4H PRN, Jens Durand, MD, 5 mL at 01/04/24 2133   ketorolac  (TORADOL ) 15 MG/ML injection 15 mg, 15 mg, Intravenous, Q6H PRN, Fausto Sor A, DO, 15 mg at 01/05/24 1337   ondansetron  (ZOFRAN ) injection 4 mg, 4 mg, Intravenous, Q6H PRN, Jens Durand, MD, 4 mg at 01/04/24 1805   pantoprazole  (PROTONIX ) injection 40 mg, 40 mg, Intravenous, Q12H, Tobie Mario GAILS, MD, 40 mg at 01/05/24 0910   polyethylene glycol (MIRALAX  / GLYCOLAX ) packet 17 g, 17 g, Oral, Daily, Fausto Sor A, DO, 17 g at 01/05/24 9082   senna-docusate (Senokot-S) tablet 1 tablet, 1 tablet, Oral, BID, Fausto Sor A, DO, 1 tablet at 01/04/24 2133   sodium chloride  flush (NS) 0.9 % injection 3 mL, 3 mL, Intravenous, Q12H, Tobie Mario GAILS, MD, 3 mL at 01/05/24 9082   sodium chloride  HYPERTONIC 3 % nebulizer solution 4 mL, 4 mL, Nebulization, Q8H PRN, Fausto Sor A, DO   venlafaxine  XR (EFFEXOR -XR) 24 hr capsule 150 mg, 150 mg, Oral, Q breakfast, Tobie Mario V, MD, 150 mg at 01/05/24 0910    ALLERGIES   Nirmatrelvir-ritonavir, Atorvastatin , and Meloxicam     REVIEW OF SYSTEMS    Review of Systems:  Gen:  Denies  fever, sweats, chills weigh loss  HEENT: Denies blurred vision, double vision, ear pain, eye pain, hearing loss, nose bleeds, sore throat Cardiac:  No dizziness, chest pain or heaviness, chest tightness,edema Resp:   reports dyspnea chronically  Gi: Denies swallowing difficulty, stomach pain, nausea or vomiting, diarrhea, constipation, bowel incontinence Gu:  Denies bladder incontinence, burning urine Ext:   Denies Joint pain, stiffness or swelling Skin: Denies  skin rash, easy  bruising or bleeding or hives Endoc:  Denies polyuria, polydipsia , polyphagia or weight change Psych:   Denies depression, insomnia or hallucinations   Other:  All other systems negative   VS: BP 127/66 (BP Location: Left Arm)   Pulse 69   Temp 98.4 F (36.9 C)   Resp 16   Ht 5' 5 (1.651 m)   Wt 82.5 kg   SpO2 100%   BMI 30.27 kg/m      PHYSICAL EXAM    GENERAL:NAD, no fevers, chills, no weakness no fatigue HEAD: Normocephalic, atraumatic.  EYES: Pupils equal, round, reactive to light. Extraocular muscles intact. No scleral icterus.  MOUTH: Moist mucosal  membrane. Dentition intact. No abscess noted.  EAR, NOSE, THROAT: Clear without exudates. No external lesions.  NECK: Supple. No thyromegaly. No nodules. No JVD.  PULMONARY: decreased breath sounds with mild rhonchi worse at bases bilaterally.  CARDIOVASCULAR: S1 and S2. Regular rate and rhythm. No murmurs, rubs, or gallops. No edema. Pedal pulses 2+ bilaterally.  GASTROINTESTINAL: Soft, nontender, nondistended. No masses. Positive bowel sounds. No hepatosplenomegaly.  MUSCULOSKELETAL: No swelling, clubbing, or edema. Range of motion full in all extremities.  NEUROLOGIC: Cranial nerves II through XII are intact. No gross focal neurological deficits. Sensation intact. Reflexes intact.  SKIN: No ulceration, lesions, rashes, or cyanosis. Skin warm and dry. Turgor intact.  PSYCHIATRIC: Mood, affect within normal limits. The patient is awake, alert and oriented x 3. Insight, judgment intact.       IMAGING     ASSESSMENT/PLAN    Atypical pneumonia with infiltrative mass  - patient has long history of asthma and recurrent bronchitis   - she may have organizing pneumonia post infectious process, recently she recalls being sick with COVID 19 in 2024   - she is at risk for aspergillus due to asthma   - she denies cancer history and denies personal history of smoking , but does endorse heavy second hand smoke exposure   -  at this time covering with augmentin  would be appropriate.  I discussed this with Dr Fayette infectious disease.  She does have hiatal hernia and reports episodes of regurgitation.  She reports going to [facility] for funduplication but she had delayed gastric emptying so this was cancelled.     -she would benefit from steroids due to underlying asthma , she reports responding well to steroids in the past  - she reports recurrent UTI  - we discussed potential need for bronchoscopy and we will do this post treatment and re-imaging.   -aspergillus and cryptococcal serology due to underlying chronic lung disease  - CRP trend daily   - duoneb therapy while hospitalized  - respiratory cultures ordered        Thank you for allowing me to participate in the care of this patient.   Patient/Family are satisfied with care plan and all questions have been answered.    Provider disclosure: Patient with at least one acute or chronic illness or injury that poses a threat to life or bodily function and is being managed actively during this encounter.  All of the below services have been performed independently by signing provider:  review of prior documentation from internal and or external health records.  Review of previous and current lab results.  Interview and comprehensive assessment during patient visit today. Review of current and previous chest radiographs/CT scans. Discussion of management and test interpretation with health care team and patient/family.   This document was prepared using Dragon voice recognition software and may include unintentional dictation errors.     Montrae Braithwaite, M.D.  Division of Pulmonary & Critical Care Medicine

## 2024-01-05 NOTE — Progress Notes (Signed)
 Sputum induction performed with 4 ml 3% hypertonic saline  Patient upright in bed. SVN treatment given. Patient with non productive cough noted throughout treatment. BBS noted to be clear. Vitals stable. Sputum cups at bedside.

## 2024-01-06 DIAGNOSIS — J069 Acute upper respiratory infection, unspecified: Secondary | ICD-10-CM | POA: Diagnosis not present

## 2024-01-06 DIAGNOSIS — R509 Fever, unspecified: Secondary | ICD-10-CM | POA: Diagnosis not present

## 2024-01-06 DIAGNOSIS — J208 Acute bronchitis due to other specified organisms: Secondary | ICD-10-CM | POA: Diagnosis not present

## 2024-01-06 LAB — QUANTIFERON-TB GOLD PLUS (RQFGPL)
QuantiFERON Mitogen Value: 5.05 [IU]/mL
QuantiFERON Nil Value: 0.11 [IU]/mL
QuantiFERON TB1 Ag Value: 0.11 [IU]/mL
QuantiFERON TB2 Ag Value: 0.1 [IU]/mL

## 2024-01-06 LAB — CBC
HCT: 27.5 % — ABNORMAL LOW (ref 36.0–46.0)
Hemoglobin: 9.7 g/dL — ABNORMAL LOW (ref 12.0–15.0)
MCH: 31.7 pg (ref 26.0–34.0)
MCHC: 35.3 g/dL (ref 30.0–36.0)
MCV: 89.9 fL (ref 80.0–100.0)
Platelets: 263 10*3/uL (ref 150–400)
RBC: 3.06 MIL/uL — ABNORMAL LOW (ref 3.87–5.11)
RDW: 13.3 % (ref 11.5–15.5)
WBC: 4.6 10*3/uL (ref 4.0–10.5)
nRBC: 0 % (ref 0.0–0.2)

## 2024-01-06 LAB — QUANTIFERON-TB GOLD PLUS: QuantiFERON-TB Gold Plus: NEGATIVE

## 2024-01-06 LAB — C-REACTIVE PROTEIN: CRP: 8 mg/dL — ABNORMAL HIGH (ref <1.0)

## 2024-01-06 MED ORDER — TRAZODONE HCL 50 MG PO TABS: 50.0000 mg | ORAL_TABLET | Freq: Every evening | ORAL | Status: DC | PRN

## 2024-01-06 MED ORDER — ALPRAZOLAM 0.25 MG PO TABS
0.2500 mg | ORAL_TABLET | Freq: Three times a day (TID) | ORAL | Status: DC | PRN
  Administered 2024-01-06 – 2024-01-08 (×3): 0.25 mg via ORAL
  Filled 2024-01-06 (×5): qty 1

## 2024-01-06 MED ORDER — HYDRALAZINE HCL 25 MG PO TABS: 25.0000 mg | ORAL_TABLET | Freq: Three times a day (TID) | ORAL | Status: DC | PRN

## 2024-01-06 MED ORDER — PANTOPRAZOLE SODIUM 40 MG PO TBEC
40.0000 mg | DELAYED_RELEASE_TABLET | Freq: Two times a day (BID) | ORAL | Status: DC
  Administered 2024-01-06 – 2024-01-09 (×6): 40 mg via ORAL
  Filled 2024-01-06 (×6): qty 1

## 2024-01-06 NOTE — Progress Notes (Signed)
 Hypertonic Saline nebulizer given with no production of sputum

## 2024-01-06 NOTE — TOC Progression Note (Signed)
 Transition of Care [facility]) - Progression Note    Patient Details  Name: Jody Brewer MRN: [id number] Date of Birth: January 11, 1951  [facility] CM/SW Contact  Tomasa JAYSON Childes, RN Phone Number: 01/06/2024, 10:12 AM  Clinical Narrative:    TOC continuing to follow patient's progress throughout discharge planning.        Expected Discharge Plan and Services                                               Social Determinants of Health (SDOH) Interventions SDOH Screenings   Food Insecurity: No Food Insecurity (01/03/2024)  Housing: Low Risk  (01/03/2024)  Transportation Needs: No Transportation Needs (01/03/2024)  Utilities: Not At Risk (01/03/2024)  Financial Resource Strain: Low Risk  (09/16/2023)   Received from [facility]  Social Connections: Socially Isolated (01/03/2024)  Tobacco Use: Low Risk  (01/05/2024)    Readmission Risk Interventions     No data to display

## 2024-01-06 NOTE — Progress Notes (Signed)
 Date of Admission:  01/02/2024   Total days of antibiotics            ID: Jody Brewer is a 73 y.o. female  Principal Problem:   Fever Active Problems:   AKI (acute kidney injury) (HCC)   Anemia   GERD (gastroesophageal reflux disease)   Multinodular goiter   Dizziness   Hypotension   Headache   URI (upper respiratory infection)   Organizing pneumonia (HCC)    Subjective: Still coughing but not productive Unable to bring any sputum even with hypertonic saline   Medications:   benzonatate   200 mg Oral TID   dexamethasone  (DECADRON ) injection  8 mg Intravenous Q24H   enoxaparin  (LOVENOX ) injection  40 mg Subcutaneous Q24H   pantoprazole  (PROTONIX ) IV  40 mg Intravenous Q12H   polyethylene glycol  17 g Oral Daily   senna-docusate  1 tablet Oral BID   sodium chloride  flush  3 mL Intravenous Q12H   sodium chloride  HYPERTONIC  4 mL Nebulization Q8H   venlafaxine  XR  150 mg Oral Q breakfast    Objective: Vital signs in last 24 hours: Patient Vitals for the past 24 hrs:  BP Temp Pulse Resp SpO2 Weight  01/06/24 0839 138/73 98.7 F (37.1 C) 67 18 99 % --  01/06/24 0500 130/74 98.1 F (36.7 C) 83 20 92 % 80.1 kg  01/05/24 2025 (!) 140/72 98.4 F (36.9 C) 76 20 94 % --      PHYSICAL EXAM:  General: Alert, cooperative, no distress, appears stated age.  Lungs:b/l air entry- few rhonchi.b/l Heart: Regular rate and rhythm, no murmur, rub or gallop. Abdomen: Soft, non-tender,not distended. Bowel sounds normal. No masses Extremities: atraumatic, no cyanosis. No edema. No clubbing Skin: No rashes or lesions. Or bruising Lymph: Cervical, supraclavicular normal. Neurologic: Grossly non-focal  Lab Results    Latest Ref Rng & Units 01/06/2024    4:14 AM 01/04/2024    6:09 AM 01/03/2024    1:26 PM  CBC  WBC 4.0 - 10.5 K/uL 4.6  5.8  7.2   Hemoglobin 12.0 - 15.0 g/dL 9.7  8.6  8.4   Hematocrit 36.0 - 46.0 % 27.5  26.1  24.7   Platelets 150 - 400 K/uL 263  206  232         Latest Ref Rng & Units 01/05/2024    5:54 AM 01/04/2024    6:09 AM 01/03/2024    1:26 PM  CMP  Glucose 70 - 99 mg/dL 79  89  883   BUN 8 - 23 mg/dL 12  19  18    Creatinine 0.44 - 1.00 mg/dL 9.17  8.94  8.78   Sodium 135 - 145 mmol/L 139  146  140   Potassium 3.5 - 5.1 mmol/L 4.0  3.3  3.6   Chloride 98 - 111 mmol/L 106  113  110   CO2 22 - 32 mmol/L 24  25  20    Calcium  8.9 - 10.3 mg/dL 8.5  8.5  7.9   Total Protein 6.5 - 8.1 g/dL   6.0   Total Bilirubin 0.0 - 1.2 mg/dL   0.6   Alkaline Phos 38 - 126 U/L   55   AST 15 - 41 U/L   30   ALT 0 - 44 U/L   18       Microbiology: BC- NG UC- NG    CT chest subsolid lesions rt lung   CT CHEST ABDOMEN PELVIS W  CONTRAST Result Date: 01/04/2024 CLINICAL DATA:  Fevers of unknown origin for 6 months * Tracking Code: BO * EXAM: CT CHEST, ABDOMEN, AND PELVIS WITH CONTRAST TECHNIQUE: Multidetector CT imaging of the chest, abdomen and pelvis was performed following the standard protocol during bolus administration of intravenous contrast. RADIATION DOSE REDUCTION: This exam was performed according to the departmental dose-optimization program which includes automated exposure control, adjustment of the mA and/or kV according to patient size and/or use of iterative reconstruction technique. CONTRAST:  OMNIPAQUE  IOHEXOL  300 MG/ML SOLN additional oral enteric contrast COMPARISON:  CT abdomen pelvis, 07/15/2023 FINDINGS: CT CHEST FINDINGS Cardiovascular: No significant vascular findings. Normal heart size. No pericardial effusion. Mediastinum/Nodes: No enlarged mediastinal, hilar, or axillary lymph nodes. Small hiatal hernia thyroid  gland, trachea, and esophagus demonstrate no significant findings. Lungs/Pleura: Large, irregular subsolid opacity of the posterior right upper lobe measuring (series 4, image 67) as well as an additional large irregular subpleural opacity of the dependent right lower lobe measuring 5.6 x 5.4 cm (series 4, image 76).  Multiple small right pulmonary nodules, for example in the right lung base measuring 0.5 cm (series 4, image 42) and in the anterior right upper lobe measuring 0.7 cm (series 4, image 42). Small bilateral pleural effusions. Musculoskeletal: No chest wall abnormality. No acute osseous findings. CT ABDOMEN PELVIS FINDINGS Hepatobiliary: No focal liver abnormality is seen. Status post cholecystectomy. Postoperative biliary ductal dilatation. Pancreas: Unremarkable. No pancreatic ductal dilatation or surrounding inflammatory changes. Spleen: Normal in size without significant abnormality. Adrenals/Urinary Tract: Adrenal glands are unremarkable. Kidneys are normal, without renal calculi, solid lesion, or hydronephrosis. Bladder is unremarkable. Stomach/Bowel: Stomach is within normal limits. Status post appendectomy. No evidence of bowel wall thickening, distention, or inflammatory changes. Sigmoid diverticula Vascular/Lymphatic: Aortic atherosclerosis. No enlarged abdominal or pelvic lymph nodes. Reproductive: Status post hysterectomy. Other: No abdominal wall hernia or abnormality. No ascites. Musculoskeletal: No acute osseous findings. Posterior thoracolumbar fusion T9-S1. IMPRESSION: 1. Large, irregular subsolid opacity of the posterior right upper lobe measuring as well as an additional large irregular subpleural opacity of the dependent right lower lobe. These are in general most likely to be infectious or inflammatory, general differential considerations including unusual etiology such as eosinophilic or organizing pneumonias, however given morphology and unilateral distribution, infiltrative adenocarcinoma is distinctly not excluded. 2. Multiple small right pulmonary nodules measuring up to 0.7 cm, nonspecific. Attention on follow-up. 3. Small bilateral pleural effusions. 4. No evidence of lymphadenopathy or metastatic disease in the abdomen or pelvis. 5. Status post cholecystectomy, appendectomy, and  hysterectomy. 6. Sigmoid diverticulosis without evidence of acute diverticulitis. Aortic Atherosclerosis (ICD10-I70.0). Electronically Signed   By: Marolyn JONETTA Jaksch M.D.   On: 01/04/2024 20:54     Assessment/Plan: 73 yr female presenting with cough of a few days duration, intermittent fever night sweats of 6 months Urinary symptoms intermittently with no significant improvement with many courses of antibiotics only to have candidiasis sand received fluconazole    Bronchitis- likely viral- neg resp viral panel     Intermittent fever of 6 months night time with sweats- B symptoms  ferritin ( N), LDH ( N), ESR ( 58), CRP, 2 d echo (no  endocarditis),  D.D is infection VS inflammation, autoimmune Vs B symptoms Pel ebstein fever  CT chest showed subsolid opacities X 2 on the rt side of significant size DC air borne precaution as this is not TB especially with negative quant gold and unusual CT finding, and no sputum even with hypertonic saline or aspiration pneumonia Need  to r/o adenoca Will DC unasyn  She needs bronchoscopy and transbroch biopsy for pathology and culture   May need Autoimmune panel Wondered about adrenal insufficiency with sometimes low BP and dehydration Cortisol cannot be sent because she received Hydrocortisone  TSH N  Agree with not giving any antibiotics and waiting for blood culture and working her up   No evidence of UTI- would avoid antibiotics unless systemic illness, local symptoms and diagnostic findings Too many courses of antibiotic can upset the microbiome   Hypotension- pt has hypertension and on ACEI- because of low BP , dehydration intermittently may consider dose reduction   Anemia- recheck   H/o covid- could she have autonomic symptoms   H/o lumbar fusion surgery Rt TkA    Airborne isolation Induce sputum with hypertonic saline X3  Spoke to RT           ? Discussed with patient and  pulmonologist and hospitalist   ID will not see her this  weekend On call ID available by phone for urgent issue

## 2024-01-06 NOTE — Progress Notes (Signed)
 PULMONOLOGY         Date: 01/06/2024,   MRN# [id number] Jody Brewer 1951/06/16     AdmissionWeight: 77.1 kg                 CurrentWeight: 80.1 kg  Referring provider: Dr Fausto   CHIEF COMPLAINT:   Abnormal CT chest    HISTORY OF PRESENT ILLNESS   This is a 73yo F with PMH of essential HTN, AKI, GERD, recurrent UTI, goiter, anemia, OSA who came in with DOE and SOB associated with presyncope, hypotension and disequilibrium.  She also has recurrent urinary tract infection with sepsis. She was noted to have fever and tachycardia on arrival to ER.  She had abnormal CT chest with findings of subsolid opacification of RLL and RUL and small nodules , all on right lung. She has not had cancer in the past but had stage 4/5 pap smear at young age and opted for hysterctomy..  She endorses heavy smoke second had exposure but never smoked herself.   01/06/24- patient reports she continues to have sever cough but its dry.  She is anxious and her children are anxious due to loss of patients husband last week.  They worry about cancer diagnosis.  Thery request to have lung biopsy to be done asap. I have discussed this with infectious disease specialist and atending physician in charge and we are in agreement to do this when possible.    PAST MEDICAL HISTORY   Past Medical History:  Diagnosis Date   Asthma    COVID-19 11/2019   hospitalized x2   GERD (gastroesophageal reflux disease)    Headache    Hypertension    Sleep apnea      SURGICAL HISTORY   Past Surgical History:  Procedure Laterality Date   ABDOMINAL HYSTERECTOMY     APPENDECTOMY     CATARACT EXTRACTION W/PHACO Left 06/10/2021   Procedure: CATARACT EXTRACTION PHACO AND INTRAOCULAR LENS PLACEMENT (IOC) LEFT;  Surgeon: Mittie Gaskin, MD;  Location: [facility];  Service: Ophthalmology;  Laterality: Left;  5.69 0:53.6 10.6%   CATARACT EXTRACTION W/PHACO Right 06/24/2021   Procedure:  CATARACT EXTRACTION PHACO AND INTRAOCULAR LENS PLACEMENT (IOC) RIGHT;  Surgeon: Mittie Gaskin, MD;  Location: [facility];  Service: Ophthalmology;  Laterality: Right;  3.61 00:52.4   CHOLECYSTECTOMY     ELBOW BURSA SURGERY Right    FRENULECTOMY, LINGUAL     and removal of portion of soft palate   LUMBAR DISC SURGERY  1996   L3-L5   LUMBAR DISC SURGERY  2004   LUMBAR FUSION  2006   bone harvest rods (2) and screws (36)   LUMBAR LAMINECTOMY  1998   L3-L5   MAXILLARY ANTROSTOMY Right 08/12/2022   Procedure: MAXILLARY ANTROSTOMY REVISION;  Surgeon: Edda Mt, MD;  Location: [facility];  Service: ENT;  Laterality: Right;  placed disk on OR charge nurse desk 8-8 kp   OOPHORECTOMY     1 ovary removed   TOTAL KNEE ARTHROPLASTY Right 11/17/2021   Procedure: TOTAL KNEE ARTHROPLASTY;  Surgeon: Edie Norleen PARAS, MD;  Location: [facility];  Service: Orthopedics;  Laterality: Right;   UVULOPALATOPHARYNGOPLASTY  2001   XI ROBOTIC LAPAROSCOPIC ASSISTED APPENDECTOMY N/A 05/31/2023   Procedure: XI ROBOTIC LAPAROSCOPIC ASSISTED APPENDECTOMY;  Surgeon: Desiderio Schanz, MD;  Location: [facility];  Service: General;  Laterality: N/A;     FAMILY HISTORY   Family History  Problem Relation Age of Onset  Emphysema Mother    Parkinson's disease Father    Dementia Father    Hypertension Father    Breast cancer Neg Hx      SOCIAL HISTORY   Social History   Tobacco Use   Smoking status: Never    Passive exposure: Never   Smokeless tobacco: Never  Vaping Use   Vaping status: Never Used  Substance Use Topics   Alcohol use: Not Currently    Alcohol/week: 3.0 standard drinks of alcohol    Types: 3 Glasses of wine per week   Drug use: Not Currently     MEDICATIONS    Home Medication:    Current Medication:  Current Facility-Administered Medications:    acetaminophen  (TYLENOL ) tablet 650 mg, 650 mg, Oral, Q6H PRN, 650 mg at 01/05/24 0503 **OR** acetaminophen  (TYLENOL )  suppository 650 mg, 650 mg, Rectal, Q6H PRN, Tobie Mario GAILS, MD   albuterol  (PROVENTIL ) (2.5 MG/3ML) 0.083% nebulizer solution 2.5 mg, 2.5 mg, Nebulization, Q4H PRN, Fausto Sor A, DO, 2.5 mg at 01/05/24 1537   ALPRAZolam  (XANAX ) tablet 0.25 mg, 0.25 mg, Oral, TID PRN, Fausto Sor A, DO   benzonatate  (TESSALON ) capsule 200 mg, 200 mg, Oral, TID, Fausto Sor A, DO, 200 mg at 01/06/24 1015   chlorpheniramine-HYDROcodone  (TUSSIONEX) 10-8 MG/5ML suspension 5 mL, 5 mL, Oral, Q12H PRN, Fausto Sor A, DO, 5 mL at 01/05/24 0018   cyclobenzaprine  (FLEXERIL ) tablet 5 mg, 5 mg, Oral, TID PRN, Fausto Sor A, DO   dexamethasone  (DECADRON ) injection 8 mg, 8 mg, Intravenous, Q24H, Kelin Borum, MD, 8 mg at 01/05/24 1717   enoxaparin  (LOVENOX ) injection 40 mg, 40 mg, Subcutaneous, Q24H, Patel, Kishan S, RPH, 40 mg at 01/05/24 2041   guaiFENesin  (ROBITUSSIN) 100 MG/5ML liquid 5 mL, 5 mL, Oral, Q4H PRN, Ayiku, Bernard, MD, 5 mL at 01/04/24 2133   hydrALAZINE  (APRESOLINE ) tablet 25 mg, 25 mg, Oral, Q8H PRN, Fausto Sor A, DO   ketorolac  (TORADOL ) 15 MG/ML injection 15 mg, 15 mg, Intravenous, Q6H PRN, Fausto Sor A, DO, 15 mg at 01/05/24 1337   ondansetron  (ZOFRAN ) injection 4 mg, 4 mg, Intravenous, Q6H PRN, Jens Durand, MD, 4 mg at 01/04/24 1805   pantoprazole  (PROTONIX ) EC tablet 40 mg, 40 mg, Oral, BID, Meegan, Eryn, RPH   polyethylene glycol (MIRALAX  / GLYCOLAX ) packet 17 g, 17 g, Oral, Daily, Fausto Sor A, DO, 17 g at 01/06/24 1015   senna-docusate (Senokot-S) tablet 1 tablet, 1 tablet, Oral, BID, Fausto Sor A, DO, 1 tablet at 01/06/24 1016   sodium chloride  flush (NS) 0.9 % injection 3 mL, 3 mL, Intravenous, Q12H, Tobie, Ekta V, MD, 3 mL at 01/06/24 1017   sodium chloride  HYPERTONIC 3 % nebulizer solution 4 mL, 4 mL, Nebulization, Q8H PRN, Fausto Sor A, DO   sodium chloride  HYPERTONIC 3 % nebulizer solution 4 mL, 4 mL, Nebulization, Q8H, Ravishankar, Jayashree, MD,  4 mL at 01/06/24 1454   traZODone  (DESYREL ) tablet 50 mg, 50 mg, Oral, QHS PRN, Fausto Sor A, DO   venlafaxine  XR (EFFEXOR -XR) 24 hr capsule 150 mg, 150 mg, Oral, Q breakfast, Tobie Mario V, MD, 150 mg at 01/06/24 1015    ALLERGIES   Nirmatrelvir-ritonavir, Atorvastatin , and Meloxicam     REVIEW OF SYSTEMS    Review of Systems:  Gen:  Denies  fever, sweats, chills weigh loss  HEENT: Denies blurred vision, double vision, ear pain, eye pain, hearing loss, nose bleeds, sore throat Cardiac:  No dizziness, chest pain or heaviness, chest tightness,edema Resp:  reports dyspnea chronically  Gi: Denies swallowing difficulty, stomach pain, nausea or vomiting, diarrhea, constipation, bowel incontinence Gu:  Denies bladder incontinence, burning urine Ext:   Denies Joint pain, stiffness or swelling Skin: Denies  skin rash, easy bruising or bleeding or hives Endoc:  Denies polyuria, polydipsia , polyphagia or weight change Psych:   Denies depression, insomnia or hallucinations   Other:  All other systems negative   VS: BP 133/62 (BP Location: Left Arm)   Pulse 85   Temp 98.3 F (36.8 C)   Resp 18   Ht 5' 5 (1.651 m)   Wt 80.1 kg   SpO2 100%   BMI 29.39 kg/m      PHYSICAL EXAM    GENERAL:NAD, no fevers, chills, no weakness no fatigue HEAD: Normocephalic, atraumatic.  EYES: Pupils equal, round, reactive to light. Extraocular muscles intact. No scleral icterus.  MOUTH: Moist mucosal membrane. Dentition intact. No abscess noted.  EAR, NOSE, THROAT: Clear without exudates. No external lesions.  NECK: Supple. No thyromegaly. No nodules. No JVD.  PULMONARY: decreased breath sounds with mild rhonchi worse at bases bilaterally.  CARDIOVASCULAR: S1 and S2. Regular rate and rhythm. No murmurs, rubs, or gallops. No edema. Pedal pulses 2+ bilaterally.  GASTROINTESTINAL: Soft, nontender, nondistended. No masses. Positive bowel sounds. No hepatosplenomegaly.  MUSCULOSKELETAL: No  swelling, clubbing, or edema. Range of motion full in all extremities.  NEUROLOGIC: Cranial nerves II through XII are intact. No gross focal neurological deficits. Sensation intact. Reflexes intact.  SKIN: No ulceration, lesions, rashes, or cyanosis. Skin warm and dry. Turgor intact.  PSYCHIATRIC: Mood, affect within normal limits. The patient is awake, alert and oriented x 3. Insight, judgment intact.       IMAGING     ASSESSMENT/PLAN    Atypical pneumonia with infiltrative mass  - patient has long history of asthma and recurrent bronchitis   - she may have organizing pneumonia post infectious process, recently she recalls being sick with COVID 19 in 2024   - she is at risk for aspergillus due to asthma   - she denies cancer history and denies personal history of smoking , but does endorse heavy second hand smoke exposure   - at this time covering with augmentin  would be appropriate.  I discussed this with Dr Fayette infectious disease.  She does have hiatal hernia and reports episodes of regurgitation.  She reports going to [facility] for funduplication but she had delayed gastric emptying so this was cancelled.     -she would benefit from steroids due to underlying asthma , she reports responding well to steroids in the past  - she reports recurrent UTI  - we discussed potential need for bronchoscopy and we will do this post treatment and re-imaging.   -aspergillus and cryptococcal serology due to underlying chronic lung disease  - CRP trend daily   - duoneb therapy while hospitalized  - respiratory cultures ordered        Thank you for allowing me to participate in the care of this patient.   Patient/Family are satisfied with care plan and all questions have been answered.    Provider disclosure: Patient with at least one acute or chronic illness or injury that poses a threat to life or bodily function and is being managed actively during this encounter.  All of the below  services have been performed independently by signing provider:  review of prior documentation from internal and or external health records.  Review of previous and current lab  results.  Interview and comprehensive assessment during patient visit today. Review of current and previous chest radiographs/CT scans. Discussion of management and test interpretation with health care team and patient/family.   This document was prepared using Dragon voice recognition software and may include unintentional dictation errors.     Margene Cherian, M.D.  Division of Pulmonary & Critical Care Medicine

## 2024-01-06 NOTE — Progress Notes (Signed)
 Progress Note   Patient: Jody Brewer FMW:[id number] DOB: [birth year] DOA: 01/02/2024     4 DOS: the patient was seen and examined on 01/06/2024   Brief hospital course:  Jody Brewer is a 73 y.o. female with medical history significant for asthma, history of COVID-19 infection requiring hospitalization, headache, hypertension, GERD, sleep apnea, recurrent UTIs (recently prescribed Augmentin  for UTI on 12/14/2023).  She presented to the hospital because of general body aches, fatigue, chills, dizziness, fever, dry cough, nausea, poor appetite.    She was febrile in the ED with temperature of 101.2 F and hypotensive with blood pressure down to 74/36.  Patient was admitted for further evaluation and management of recurring fevers of unclear cause in addition to upper respiratory symptoms.   Infectious disease was consulted.  Further hospital course and management as outlined below.    Assessment and Plan:  Fevers of unknown etiology Myalgias, Dyspnea Acute URI Possible sepsis versus SIRS - thus far no infection source found.  SARS-CoV-2, influenza, RSV tests were negative.  Respiratory viral panel negative Blood cultures pending - no growth to date. 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  Chest x-ray is negative. --Infectious disease is following -- follow up recommendations --Pulmonology consulted based on chest CT findings --RT for sputum induction -- will collect 3 sputums for AFB --CT's chest/abdomen/pelvis with contrast today --Quantiferon Gold is pending  --Robitussin as needed for cough --Add Tessalon  and Tussionex for cough --Tylenol  as needed for fever --Albuterol  nebs PRN     History of recurrent UTI, recent urine culture from 12/14/2023 showed E. coli that was treated with Augmentin : Urinalysis on this admission was unremarkable.  Urine culture  no  growth.     Hypotension: Improved with IV fluids including boluses, maintenance IV fluids.   --Monitor BP closely. --Minoxidil and olmesartan -HCTZ on hold     AKI: resolved with fluids. --Monitor BMP  Anxiety / Insomnia - related to stress of current situation --Low dose Xanax  PRN anxiety or sleep --Trazodone  PRN sleep if not using Xanax  or its ineffective     Comorbidities include GERD, multinodular goiter, history of hypertension, headache, history of acute appendicitis s/p appendectomy June 2024      Subjective: Pt seen with family at bedside today.  She reports ongoing dry hacking cough and intermittently being short of breath.  Does not appreicate improvement since antibiotic and steroids started (yesterday evening).   States steroids typically improve her breathing rapidly when she's had them for asthma flares in the past.  No other acute complaints other than lack of sleep and anxiety related to the situation.   Physical Exam: Vitals:   01/06/24 0500 01/06/24 0839 01/06/24 1455 01/06/24 1518  BP: 130/74 138/73  133/62  Pulse: 83 67 68 85  Resp: 20 18 18 18   Temp: 98.1 F (36.7 C) 98.7 F (37.1 C)  98.3 F (36.8 C)  TempSrc:      SpO2: 92% 99% 99% 100%  Weight: 80.1 kg     Height:       General exam: awake, alert, no acute distress HEENT: moist mucus membranes, hearing grossly normal  Respiratory system: intermittent right-sided crackles, left side clear, on room air Cardiovascular system: normal S1/S2, RRR Gastrointestinal system: soft, NT, ND Central nervous system: A&O x 4. no gross focal neurologic deficits, normal speech Extremities: moves all, no edema, normal  tone Skin: dry, intact, normal temperature Psychiatry: normal mood, congruent affect, judgement and insight appear normal   Data Reviewed:  Notable labs -- CRP 6.3 >> 7.4 >> 8.0, Hbg improved 9.7   LDH normal 99 Ferritin normal 134 Quantiferon gold - negative  CT chest/abd/pelvis --- see  report -- large subsolid opacity RUL and additional subpleural in dependent RLL -- infectious vs inflammatory (vs malignancy)   Family Communication:  two daughters and other family at bedside rounds  Disposition: Status is: Inpatient Remains inpatient appropriate because: ongoing evaluation   Planned Discharge Destination: Home    Time spent: 42 minutes  Author: Burnard DELENA Cunning, DO 01/06/2024 3:20 PM  For on call review [email or web address].

## 2024-01-06 NOTE — Plan of Care (Signed)

## 2024-01-06 NOTE — Progress Notes (Signed)
 PHARMACIST - PHYSICIAN COMMUNICATION  DR: Fausto   CONCERNING: IV to Oral Route Change Policy  RECOMMENDATION: This patient is receiving pantoprazole  by the intravenous route.  Based on criteria approved by the Pharmacy and Therapeutics Committee, the intravenous medication(s) is/are being converted to the equivalent oral dose form(s).  DESCRIPTION: These criteria include: The patient is eating (either orally or via tube) and/or has been taking other orally administered medications for a least 24 hours The patient has no evidence of active gastrointestinal bleeding or impaired GI absorption (gastrectomy, short bowel, patient on TNA or NPO).  If you have questions about this conversion, please contact the Pharmacy Department  []   [phone] )  [facility] [x]   [phone] )  [facility] []   [phone] )  [facility] []   [phone] )  [facility] []   [phone] )  Darryle Darra Bernerd Lionel   Evonnie Nieves, PharmD Pharmacy Resident  01/06/2024 12:54 PM

## 2024-01-07 ENCOUNTER — Inpatient Hospital Stay: Payer: Medicare Other

## 2024-01-07 DIAGNOSIS — J8489 Other specified interstitial pulmonary diseases: Secondary | ICD-10-CM

## 2024-01-07 DIAGNOSIS — R0789 Other chest pain: Secondary | ICD-10-CM

## 2024-01-07 DIAGNOSIS — J069 Acute upper respiratory infection, unspecified: Secondary | ICD-10-CM | POA: Diagnosis not present

## 2024-01-07 DIAGNOSIS — R509 Fever, unspecified: Secondary | ICD-10-CM | POA: Diagnosis not present

## 2024-01-07 LAB — CBC
HCT: 27.7 % — ABNORMAL LOW (ref 36.0–46.0)
Hemoglobin: 9.3 g/dL — ABNORMAL LOW (ref 12.0–15.0)
MCH: 30.9 pg (ref 26.0–34.0)
MCHC: 33.6 g/dL (ref 30.0–36.0)
MCV: 92 fL (ref 80.0–100.0)
Platelets: 300 10*3/uL (ref 150–400)
RBC: 3.01 MIL/uL — ABNORMAL LOW (ref 3.87–5.11)
RDW: 13.6 % (ref 11.5–15.5)
WBC: 6.6 10*3/uL (ref 4.0–10.5)
nRBC: 0 % (ref 0.0–0.2)

## 2024-01-07 LAB — BASIC METABOLIC PANEL
Anion gap: 9 (ref 5–15)
BUN: 27 mg/dL — ABNORMAL HIGH (ref 8–23)
CO2: 25 mmol/L (ref 22–32)
Calcium: 9 mg/dL (ref 8.9–10.3)
Chloride: 103 mmol/L (ref 98–111)
Creatinine, Ser: 1 mg/dL (ref 0.44–1.00)
GFR, Estimated: >60 mL/min (ref >60)
Glucose, Bld: 199 mg/dL — ABNORMAL HIGH (ref 70–99)
Potassium: 4.1 mmol/L (ref 3.5–5.1)
Sodium: 137 mmol/L (ref 135–145)

## 2024-01-07 LAB — CULTURE, BLOOD (ROUTINE X 2)
Culture: NO GROWTH
Culture: NO GROWTH
Special Requests: ADEQUATE

## 2024-01-07 LAB — URINALYSIS, COMPLETE (UACMP) WITH MICROSCOPIC
Bacteria, UA: NONE SEEN
Bilirubin Urine: NEGATIVE
Glucose, UA: NEGATIVE mg/dL
Hgb urine dipstick: NEGATIVE
Ketones, ur: NEGATIVE mg/dL
Leukocytes,Ua: NEGATIVE
Nitrite: NEGATIVE
Protein, ur: NEGATIVE mg/dL
Specific Gravity, Urine: 1.014 (ref 1.005–1.030)
pH: 8 (ref 5.0–8.0)

## 2024-01-07 LAB — TROPONIN I (HIGH SENSITIVITY): Troponin I (High Sensitivity): 7 ng/L (ref <18)

## 2024-01-07 LAB — C-REACTIVE PROTEIN: CRP: 3.9 mg/dL — ABNORMAL HIGH (ref <1.0)

## 2024-01-07 NOTE — Progress Notes (Signed)
 PULMONOLOGY         Date: 01/07/2024,   MRN# [id number] Jody Brewer 1951/12/12     AdmissionWeight: 77.1 kg                 CurrentWeight: 78.9 kg  Referring provider: Dr Fausto   CHIEF COMPLAINT:   Abnormal CT chest    HISTORY OF PRESENT ILLNESS   This is a 73yo F with PMH of essential HTN, AKI, GERD, recurrent UTI, goiter, anemia, OSA who came in with DOE and SOB associated with presyncope, hypotension and disequilibrium.  She also has recurrent urinary tract infection with sepsis. She was noted to have fever and tachycardia on arrival to ER.  She had abnormal CT chest with findings of subsolid opacification of RLL and RUL and small nodules , all on right lung. She has not had cancer in the past but had stage 4/5 pap smear at young age and opted for hysterctomy..  She endorses heavy smoke second had exposure but never smoked herself.   01/06/24- patient reports she continues to have sever cough but its dry.  She is anxious and her children are anxious due to loss of patients husband last week.  They worry about cancer diagnosis.  Thery request to have lung biopsy to be done asap. I have discussed this with infectious disease specialist and atending physician in charge and we are in agreement to do this when possible.       01/07/24- patient appears to be mildly improved.  She does report some vaginal spotting.  Her lungs appear to be improved in parallel to her cough.  She reports quite a bit of anxiety sharing she's unable to sleep.  We have bronchoscopy scheduled for Monday.  Its unusual for asthma to show up as a focal masslike infiltrate however she may have neoplasm related lesion, vasculitic process such as EGPA and DIPNECH.  We have bronchoscpy scheduled for Monday afternoon.  Patient is requesting definitive biopsy evidence of absence of cancer.  Patient is appreciative of care.   PAST MEDICAL HISTORY   Past Medical History:  Diagnosis Date   Asthma     COVID-19 11/2019   hospitalized x2   GERD (gastroesophageal reflux disease)    Headache    Hypertension    Sleep apnea      SURGICAL HISTORY   Past Surgical History:  Procedure Laterality Date   ABDOMINAL HYSTERECTOMY     APPENDECTOMY     CATARACT EXTRACTION W/PHACO Left 06/10/2021   Procedure: CATARACT EXTRACTION PHACO AND INTRAOCULAR LENS PLACEMENT (IOC) LEFT;  Surgeon: Mittie Gaskin, MD;  Location: [facility];  Service: Ophthalmology;  Laterality: Left;  5.69 0:53.6 10.6%   CATARACT EXTRACTION W/PHACO Right 06/24/2021   Procedure: CATARACT EXTRACTION PHACO AND INTRAOCULAR LENS PLACEMENT (IOC) RIGHT;  Surgeon: Mittie Gaskin, MD;  Location: [facility];  Service: Ophthalmology;  Laterality: Right;  3.61 00:52.4   CHOLECYSTECTOMY     ELBOW BURSA SURGERY Right    FRENULECTOMY, LINGUAL     and removal of portion of soft palate   LUMBAR DISC SURGERY  1996   L3-L5   LUMBAR DISC SURGERY  2004   LUMBAR FUSION  2006   bone harvest rods (2) and screws (36)   LUMBAR LAMINECTOMY  1998   L3-L5   MAXILLARY ANTROSTOMY Right 08/12/2022   Procedure: MAXILLARY ANTROSTOMY REVISION;  Surgeon: Edda Mt, MD;  Location: [facility];  Service: ENT;  Laterality: Right;  placed disk on OR charge nurse desk 8-8 kp   OOPHORECTOMY     1 ovary removed   TOTAL KNEE ARTHROPLASTY Right 11/17/2021   Procedure: TOTAL KNEE ARTHROPLASTY;  Surgeon: Edie Norleen PARAS, MD;  Location: [facility];  Service: Orthopedics;  Laterality: Right;   UVULOPALATOPHARYNGOPLASTY  2001   XI ROBOTIC LAPAROSCOPIC ASSISTED APPENDECTOMY N/A 05/31/2023   Procedure: XI ROBOTIC LAPAROSCOPIC ASSISTED APPENDECTOMY;  Surgeon: Desiderio Schanz, MD;  Location: [facility];  Service: General;  Laterality: N/A;     FAMILY HISTORY   Family History  Problem Relation Age of Onset   Emphysema Mother    Parkinson's disease Father    Dementia Father    Hypertension Father    Breast cancer Neg Hx       SOCIAL HISTORY   Social History   Tobacco Use   Smoking status: Never    Passive exposure: Never   Smokeless tobacco: Never  Vaping Use   Vaping status: Never Used  Substance Use Topics   Alcohol use: Not Currently    Alcohol/week: 3.0 standard drinks of alcohol    Types: 3 Glasses of wine per week   Drug use: Not Currently     MEDICATIONS    Home Medication:    Current Medication:  Current Facility-Administered Medications:    acetaminophen  (TYLENOL ) tablet 650 mg, 650 mg, Oral, Q6H PRN, 650 mg at 01/05/24 0503 **OR** acetaminophen  (TYLENOL ) suppository 650 mg, 650 mg, Rectal, Q6H PRN, Tobie Mario GAILS, MD   albuterol  (PROVENTIL ) (2.5 MG/3ML) 0.083% nebulizer solution 2.5 mg, 2.5 mg, Nebulization, Q4H PRN, Fausto Sor A, DO, 2.5 mg at 01/05/24 1537   ALPRAZolam  (XANAX ) tablet 0.25 mg, 0.25 mg, Oral, TID PRN, Fausto Sor A, DO, 0.25 mg at 01/06/24 2300   benzonatate  (TESSALON ) capsule 200 mg, 200 mg, Oral, TID, Fausto Sor A, DO, 200 mg at 01/07/24 1550   chlorpheniramine-HYDROcodone  (TUSSIONEX) 10-8 MG/5ML suspension 5 mL, 5 mL, Oral, Q12H PRN, Fausto Sor A, DO, 5 mL at 01/05/24 0018   cyclobenzaprine  (FLEXERIL ) tablet 5 mg, 5 mg, Oral, TID PRN, Fausto Sor A, DO   dexamethasone  (DECADRON ) injection 8 mg, 8 mg, Intravenous, Q24H, Lenord Fralix, MD, 8 mg at 01/07/24 1550   enoxaparin  (LOVENOX ) injection 40 mg, 40 mg, Subcutaneous, Q24H, Patel, Kishan S, RPH, 40 mg at 01/06/24 2007   guaiFENesin  (ROBITUSSIN) 100 MG/5ML liquid 5 mL, 5 mL, Oral, Q4H PRN, Jens Durand, MD, 5 mL at 01/04/24 2133   hydrALAZINE  (APRESOLINE ) tablet 25 mg, 25 mg, Oral, Q8H PRN, Fausto Sor A, DO   ketorolac  (TORADOL ) 15 MG/ML injection 15 mg, 15 mg, Intravenous, Q6H PRN, Fausto Sor A, DO, 15 mg at 01/05/24 1337   ondansetron  (ZOFRAN ) injection 4 mg, 4 mg, Intravenous, Q6H PRN, Jens Durand, MD, 4 mg at 01/04/24 1805   pantoprazole  (PROTONIX ) EC tablet 40 mg, 40  mg, Oral, BID, Meegan, Eryn, RPH, 40 mg at 01/07/24 0927   polyethylene glycol (MIRALAX  / GLYCOLAX ) packet 17 g, 17 g, Oral, Daily, Fausto Sor A, DO, 17 g at 01/07/24 0927   senna-docusate (Senokot-S) tablet 1 tablet, 1 tablet, Oral, BID, Fausto Sor A, DO, 1 tablet at 01/07/24 0928   sodium chloride  flush (NS) 0.9 % injection 3 mL, 3 mL, Intravenous, Q12H, Tobie Mario V, MD, 3 mL at 01/07/24 0928   sodium chloride  HYPERTONIC 3 % nebulizer solution 4 mL, 4 mL, Nebulization, Q8H PRN, Fausto Sor A, DO   traZODone  (DESYREL ) tablet 50 mg, 50 mg, Oral, QHS  PRN, Fausto Sor A, DO   venlafaxine  XR (EFFEXOR -XR) 24 hr capsule 150 mg, 150 mg, Oral, Q breakfast, Tobie Mario GAILS, MD, 150 mg at 01/07/24 9071    ALLERGIES   Nirmatrelvir-ritonavir, Atorvastatin , and Meloxicam     REVIEW OF SYSTEMS    Review of Systems:  Gen:  Denies  fever, sweats, chills weigh loss  HEENT: Denies blurred vision, double vision, ear pain, eye pain, hearing loss, nose bleeds, sore throat Cardiac:  No dizziness, chest pain or heaviness, chest tightness,edema Resp:   reports dyspnea chronically  Gi: Denies swallowing difficulty, stomach pain, nausea or vomiting, diarrhea, constipation, bowel incontinence Gu:  Denies bladder incontinence, burning urine Ext:   Denies Joint pain, stiffness or swelling Skin: Denies  skin rash, easy bruising or bleeding or hives Endoc:  Denies polyuria, polydipsia , polyphagia or weight change Psych:   Denies depression, insomnia or hallucinations   Other:  All other systems negative   VS: BP 137/73 (BP Location: Left Arm)   Pulse 76   Temp 97.7 F (36.5 C)   Resp 18   Ht 5' 5 (1.651 m)   Wt 78.9 kg   SpO2 97%   BMI 28.94 kg/m      PHYSICAL EXAM    GENERAL:NAD, no fevers, chills, no weakness no fatigue HEAD: Normocephalic, atraumatic.  EYES: Pupils equal, round, reactive to light. Extraocular muscles intact. No scleral icterus.  MOUTH: Moist mucosal  membrane. Dentition intact. No abscess noted.  EAR, NOSE, THROAT: Clear without exudates. No external lesions.  NECK: Supple. No thyromegaly. No nodules. No JVD.  PULMONARY: decreased breath sounds with mild rhonchi worse at bases bilaterally.  CARDIOVASCULAR: S1 and S2. Regular rate and rhythm. No murmurs, rubs, or gallops. No edema. Pedal pulses 2+ bilaterally.  GASTROINTESTINAL: Soft, nontender, nondistended. No masses. Positive bowel sounds. No hepatosplenomegaly.  MUSCULOSKELETAL: No swelling, clubbing, or edema. Range of motion full in all extremities.  NEUROLOGIC: Cranial nerves II through XII are intact. No gross focal neurological deficits. Sensation intact. Reflexes intact.  SKIN: No ulceration, lesions, rashes, or cyanosis. Skin warm and dry. Turgor intact.  PSYCHIATRIC: Mood, affect within normal limits. The patient is awake, alert and oriented x 3. Insight, judgment intact.       IMAGING     ASSESSMENT/PLAN    Atypical pneumonia with infiltrative mass  - patient has long history of asthma and recurrent bronchitis, there is possible overying BOOP, DIPNECH, EGPA and cannot rule out neoplasm as indicated by radiologist   - she may have organizing pneumonia post infectious process, recently she recalls being sick with COVID 19 in 2024   - she is at risk for aspergillus due to asthma   - she denies cancer history and denies personal history of smoking , but does endorse heavy second hand smoke exposure   - at this time covering with augmentin  would be appropriate.  I discussed this with Dr Fayette infectious disease.  She does have hiatal hernia and reports episodes of regurgitation.  She reports going to [facility] for funduplication but she had delayed gastric emptying so this was cancelled.     -she would benefit from steroids due to underlying asthma , she reports responding well to steroids in the past  - she reports recurrent UTI  - we discussed potential need for  bronchoscopy and we will do this post treatment and re-imaging.   -aspergillus and cryptococcal serology due to underlying chronic lung disease  - CRP trend daily is improved  -  duoneb therapy while hospitalized  - respiratory cultures ordered        Thank you for allowing me to participate in the care of this patient.   Patient/Family are satisfied with care plan and all questions have been answered.    Provider disclosure: Patient with at least one acute or chronic illness or injury that poses a threat to life or bodily function and is being managed actively during this encounter.  All of the below services have been performed independently by signing provider:  review of prior documentation from internal and or external health records.  Review of previous and current lab results.  Interview and comprehensive assessment during patient visit today. Review of current and previous chest radiographs/CT scans. Discussion of management and test interpretation with health care team and patient/family.   This document was prepared using Dragon voice recognition software and may include unintentional dictation errors.     Brunette Lavalle, M.D.  Division of Pulmonary & Critical Care Medicine

## 2024-01-07 NOTE — Plan of Care (Signed)

## 2024-01-07 NOTE — Progress Notes (Signed)
 Progress Note   Patient: Jody Brewer FMW:[id number] DOB: 06-19-1951 DOA: 01/02/2024     5 DOS: the patient was seen and examined on 01/07/2024   Brief hospital course:  Jody Brewer is a 73 y.o. female with medical history significant for asthma, history of COVID-19 infection requiring hospitalization, headache, hypertension, GERD, sleep apnea, recurrent UTIs (recently prescribed Augmentin  for UTI on 12/14/2023).  She presented to the hospital because of general body aches, fatigue, chills, dizziness, fever, dry cough, nausea, poor appetite.    She was febrile in the ED with temperature of 101.2 F and hypotensive with blood pressure down to 74/36.  Patient was admitted for further evaluation and management of recurring fevers of unclear cause in addition to upper respiratory symptoms.   Infectious disease was consulted.  Further hospital course and management as outlined below.    Assessment and Plan:  Fevers of unknown etiology Myalgias, Dyspnea Acute URI Possible sepsis versus SIRS - thus far no infection source found.  SARS-CoV-2, influenza, RSV tests were negative.  Respiratory viral panel negative Blood cultures pending - no growth to date. 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  Chest x-ray is negative. --Infectious disease is following -- follow up recommendations --Pulmonology consulted  --Started IV Decadron  8 mg daily on 1/9 --Briefly on Unasyn  but this was stopped given low suspicion for bacterial infection, neg procal --Bronchoscopy planned Monday 1/13 per pulmonology --Follow any biopsy and BAL results post-bronch --Quantiferon Gold NEGATIVE -- isolation precautions dc'd per ID  --Robitussin, Tessalon  and Tussionex PRN for cough --Tylenol  as needed for fever --Albuterol  nebs PRN     History of recurrent UTI, recent urine culture from  12/14/2023 showed E. coli that was treated with Augmentin : Urinalysis on this admission was unremarkable.  Urine culture no  growth.     Hypotension: Improved with IV fluids including boluses, maintenance IV fluids.   --Monitor BP closely. --Minoxidil and olmesartan -HCTZ on hold     AKI: resolved with fluids. --Monitor BMP  Anxiety / Insomnia - related to stress of current situation --Low dose Xanax  PRN anxiety or sleep --Trazodone  PRN sleep if not using Xanax  or its ineffective     Comorbidities include GERD, multinodular goiter, history of hypertension, headache, history of acute appendicitis s/p appendectomy June 2024      Subjective: Pt up in recliner when seen this AM.  She reports her chest feeling heavy, but not painful, started this AM.  She has ongoing dry hacking cough.  Reports she woke up this AM in a cold sweat but didn't have a fever.  Slept about 5.5 hours which is an improvement from prior nights.   Physical Exam: Vitals:   01/07/24 0500 01/07/24 0600 01/07/24 0838 01/07/24 1126  BP:   131/65 126/65  Pulse:   72 78  Resp:   18 18  Temp:   98.2 F (36.8 C) 97.9 F (36.6 C)  TempSrc:      SpO2:   97% 100%  Weight: 81.9 kg 78.9 kg    Height:       General exam: awake, alert, no acute distress HEENT: moist mucus membranes, hearing grossly normal  Respiratory system: faint right-sided crackles, left side clear, on room air Cardiovascular system: normal S1/S2, RRR Central nervous system: A&O x 4. no gross focal neurologic deficits, normal speech Extremities: moves all, no edema, normal tone  Skin: dry, intact, normal temperature Psychiatry: normal mood, congruent affect, judgement and insight appear normal   Data Reviewed:  Notable labs -- CRP 6.3 >> 7.4 >> 8.0, Hbg 9.7 >> 9.3  BMP normal except glucose 199 (on steroids) and BUN 27 Troponin 7  LDH normal 99 Ferritin normal 134 Quantiferon gold - negative  CT chest/abd/pelvis --- see report -- large  subsolid opacity RUL and additional subpleural in dependent RLL -- infectious vs inflammatory (vs malignancy)   Family Communication:  two daughters and other family at bedside rounds 1/10. Son at bedside 1/8, 1/9   Disposition: Status is: Inpatient Remains inpatient appropriate because: ongoing evaluation with bronchoscopy planned Monday 1/13   Planned Discharge Destination: Home    Time spent: 42 minutes  Author: Burnard DELENA Cunning, DO 01/07/2024 2:28 PM  For on call review [email or web address].

## 2024-01-08 DIAGNOSIS — R509 Fever, unspecified: Secondary | ICD-10-CM | POA: Diagnosis not present

## 2024-01-08 DIAGNOSIS — J8489 Other specified interstitial pulmonary diseases: Secondary | ICD-10-CM | POA: Diagnosis not present

## 2024-01-08 DIAGNOSIS — J069 Acute upper respiratory infection, unspecified: Secondary | ICD-10-CM | POA: Diagnosis not present

## 2024-01-08 MED ORDER — MONTELUKAST SODIUM 10 MG PO TABS
10.0000 mg | ORAL_TABLET | Freq: Every day | ORAL | Status: DC
  Administered 2024-01-08: 10 mg via ORAL
  Filled 2024-01-08: qty 1

## 2024-01-08 MED ORDER — ESTRADIOL 0.1 MG/GM VA CREA
1.0000 | TOPICAL_CREAM | VAGINAL | Status: DC
  Administered 2024-01-08: 1 via VAGINAL
  Filled 2024-01-08: qty 42.5

## 2024-01-08 MED ORDER — ESTRADIOL 0.5 MG PO TABS
0.5000 mg | ORAL_TABLET | Freq: Every day | ORAL | Status: DC
  Administered 2024-01-08 – 2024-01-09 (×2): 0.5 mg via ORAL
  Filled 2024-01-08 (×2): qty 1

## 2024-01-08 MED ORDER — BUPROPION HCL ER (XL) 150 MG PO TB24
150.0000 mg | ORAL_TABLET | Freq: Every day | ORAL | Status: DC
  Administered 2024-01-08 – 2024-01-09 (×2): 150 mg via ORAL
  Filled 2024-01-08 (×2): qty 1

## 2024-01-08 NOTE — Progress Notes (Addendum)
 Progress Note   Patient: Jody Brewer DOB: 1951/05/22 DOA: 01/02/2024     6 DOS: the patient was seen and examined on 01/08/2024   Brief hospital course:  Jody Brewer is a 73 y.o. female with medical history significant for asthma, history of COVID-19 infection requiring hospitalization, headache, hypertension, GERD, sleep apnea, recurrent UTIs (recently prescribed Augmentin  for UTI on 12/14/2023).  She presented to the hospital because of general body aches, fatigue, chills, dizziness, fever, dry cough, nausea, poor appetite.    She was febrile in the ED with temperature of 101.2 F and hypotensive with blood pressure down to 74/36.  Patient was admitted for further evaluation and management of recurring fevers of unclear cause in addition to upper respiratory symptoms.   Infectious disease was consulted.  Further hospital course and management as outlined below.    Assessment and Plan:  Fevers of unknown etiology Myalgias, Dyspnea Acute URI Possible sepsis versus SIRS - thus far no infection source found.  SARS-CoV-2, influenza, RSV tests were negative.  Respiratory viral panel negative Blood cultures pending - no growth to date. 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  Chest x-ray is negative. --Infectious disease is following -- follow up recommendations --Pulmonology consulted  --Started IV Decadron  8 mg daily on 1/9 - continue --Briefly on Unasyn  but this was stopped given low suspicion for bacterial infection, neg procal --Bronchoscopy planned Monday 1/13 per pulmonology --NPO after midnight --Follow any biopsy and BAL results post-bronch --Quantiferon Gold NEGATIVE -- isolation precautions dc'd per ID  --Robitussin, Tessalon  and Tussionex PRN for cough --Tylenol  as needed for fever --Albuterol  nebs PRN   History of recurrent UTI,  recent urine culture from 12/14/2023 showed E. coli that was treated with Augmentin : Urinalysis on this admission was unremarkable.  Urine culture no  growth.   Hypotension: Improved with IV fluids including boluses, maintenance IV fluids.   --Monitor BP closely. --Minoxidil and olmesartan -HCTZ on hold  Normocytic anemia -- suspect dilution with initial IV fluids, possible also infection contributing to myelosuppression.  Hbg trend: 11 >> 8.4 >> 8.6 >> 9.7 >> 9.3 --Check anemia panel with AM labs --Follow CBC's   AKI: resolved with fluids. --Monitor BMP  Anxiety / Insomnia - related to stress of current situation --Low dose Xanax  PRN anxiety or sleep --Trazodone  PRN sleep if not using Xanax  or its ineffective     Comorbidities include GERD, multinodular goiter, history of hypertension, headache, history of acute appendicitis s/p appendectomy June 2024  --Resumed on home estrogen pill & cream, wellbutrin , Singulair       Subjective: Jody Brewer seen this AM at bedside.  She reports feeling somewhat better. No fevers.  We discussed bronchoscopy tomorrow.  Son and his wife arrived to bedside and updated on CT results yesterday and procedure tomorrow.  Jody Brewer and family eager for answer/diagnosis.   Physical Exam: Vitals:   01/07/24 2026 01/07/24 2323 01/08/24 0349 01/08/24 0431  BP: 134/67 134/80 135/60   Pulse: 77 71 61   Resp:      Temp: 98.3 F (36.8 C) 99.5 F (37.5 C) (!) 97.4 F (36.3 C)   TempSrc:  Oral Oral   SpO2: 94% 94% 100%   Weight:    81.3 kg  Height:       General exam: awake, alert, no acute distress HEENT: moist mucus membranes, hearing grossly normal  Respiratory  system: lungs clear no wheezes or rhonchi, on room air with normal respiratory effort at rest Cardiovascular system: normal S1/S2, RRR Central nervous system: A&O x 4. no gross focal neurologic deficits, normal speech Extremities: moves all, no edema, normal tone Skin: dry, intact, normal  temperature Psychiatry: normal mood, congruent affect, judgement and insight appear normal   Data Reviewed:  Notable labs -- CRP 6.3 >> 7.4 >> 8.0, Hbg 9.7 >> 9.3  BMP normal except glucose 199 (on steroids) and BUN 27 Troponin 7  LDH normal 99 Ferritin normal 134 Quantiferon gold - negative  Repeat CT super D chest 1/11 --- decreased density of the opacities in RUL and RLL most consistent with infectious/inflammatory process  CT chest/abd/pelvis --- see report -- large subsolid opacity RUL and additional subpleural in dependent RLL -- infectious vs inflammatory (vs malignancy)   Family Communication:   Daughters and other family at bedside rounds 1/10.  Son at bedside 1/8, 1/9, today.   Disposition: Status is: Inpatient Remains inpatient appropriate because: ongoing evaluation with bronchoscopy planned Monday 1/13   Planned Discharge Destination: Home    Time spent: 38 minutes  Author: Burnard DELENA Cunning, DO 01/08/2024 2:55 PM  For on call review [email or web address].

## 2024-01-09 ENCOUNTER — Encounter
Admission: EM | Disposition: A | Discharge status: Home / Self Care | Payer: Self-pay | Source: Home / Self Care | Attending: Internal Medicine

## 2024-01-09 ENCOUNTER — Encounter: Payer: Self-pay | Admitting: Anesthesiology

## 2024-01-09 ENCOUNTER — Inpatient Hospital Stay: Payer: Medicare Other

## 2024-01-09 DIAGNOSIS — R509 Fever, unspecified: Secondary | ICD-10-CM | POA: Diagnosis not present

## 2024-01-09 DIAGNOSIS — J8489 Other specified interstitial pulmonary diseases: Secondary | ICD-10-CM | POA: Diagnosis not present

## 2024-01-09 LAB — CBC WITH DIFFERENTIAL/PLATELET
Abs Immature Granulocytes: 0.13 10*3/uL — ABNORMAL HIGH (ref 0.00–0.07)
Basophils Absolute: 0 10*3/uL (ref 0.0–0.1)
Basophils Relative: 0 %
Eosinophils Absolute: 0 10*3/uL (ref 0.0–0.5)
Eosinophils Relative: 0 %
HCT: 29.4 % — ABNORMAL LOW (ref 36.0–46.0)
Hemoglobin: 9.9 g/dL — ABNORMAL LOW (ref 12.0–15.0)
Immature Granulocytes: 2 %
Lymphocytes Relative: 28 %
Lymphs Abs: 2.1 10*3/uL (ref 0.7–4.0)
MCH: 30.6 pg (ref 26.0–34.0)
MCHC: 33.7 g/dL (ref 30.0–36.0)
MCV: 90.7 fL (ref 80.0–100.0)
Monocytes Absolute: 0.4 10*3/uL (ref 0.1–1.0)
Monocytes Relative: 6 %
Neutro Abs: 4.8 10*3/uL (ref 1.7–7.7)
Neutrophils Relative %: 64 %
Platelets: 377 10*3/uL (ref 150–400)
RBC: 3.24 MIL/uL — ABNORMAL LOW (ref 3.87–5.11)
RDW: 13.5 % (ref 11.5–15.5)
WBC: 7.5 10*3/uL (ref 4.0–10.5)
nRBC: 0.3 % — ABNORMAL HIGH (ref 0.0–0.2)

## 2024-01-09 LAB — IRON AND TIBC
Iron: 57 ug/dL (ref 28–170)
Saturation Ratios: 20 % (ref 10.4–31.8)
TIBC: 284 ug/dL (ref 250–450)
UIBC: 227 ug/dL

## 2024-01-09 LAB — RETICULOCYTES
Immature Retic Fract: 28.8 % — ABNORMAL HIGH (ref 2.3–15.9)
RBC.: 3.25 MIL/uL — ABNORMAL LOW (ref 3.87–5.11)
Retic Count, Absolute: 109.2 10*3/uL (ref 19.0–186.0)
Retic Ct Pct: 3.4 % — ABNORMAL HIGH (ref 0.4–3.1)

## 2024-01-09 LAB — URINE CULTURE: Culture: NO GROWTH

## 2024-01-09 LAB — VITAMIN B12: Vitamin B-12: 612 pg/mL (ref 180–914)

## 2024-01-09 LAB — FERRITIN: Ferritin: 111 ng/mL (ref 11–307)

## 2024-01-09 LAB — C-REACTIVE PROTEIN: CRP: 0.8 mg/dL (ref <1.0)

## 2024-01-09 LAB — CRYPTOCOCCUS ANTIGEN, SERUM: Cryptococcus Antigen, Serum: NEGATIVE

## 2024-01-09 LAB — FOLATE: Folate: 15.4 ng/mL (ref >5.9)

## 2024-01-09 SURGERY — VIDEO BRONCHOSCOPY WITH ENDOBRONCHIAL NAVIGATION
Anesthesia: General | Laterality: Right

## 2024-01-09 MED ORDER — PREDNISONE 50 MG PO TABS: 50.0000 mg | ORAL_TABLET | Freq: Every day | ORAL | Status: DC

## 2024-01-09 MED ORDER — PREDNISONE 10 MG PO TABS: ORAL_TABLET | ORAL | 0 refills | Status: AC

## 2024-01-09 MED ORDER — PREDNISONE 50 MG PO TABS: ORAL_TABLET | ORAL | 0 refills | Status: DC

## 2024-01-09 MED ORDER — ALPRAZOLAM 0.25 MG PO TABS: 0.2500 mg | ORAL_TABLET | Freq: Three times a day (TID) | ORAL | 0 refills | Status: DC | PRN

## 2024-01-09 NOTE — Progress Notes (Signed)
 PULMONOLOGY         Date: 01/09/2024,   MRN# [id number] [name] Feb 10, 1951     AdmissionWeight: 77.1 kg                 CurrentWeight: 78.2 kg  Referring provider: Dr Fausto   CHIEF COMPLAINT:   Abnormal CT chest    HISTORY OF PRESENT ILLNESS   This is a 73yo F with PMH of essential HTN, AKI, GERD, recurrent UTI, goiter, anemia, OSA who came in with DOE and SOB associated with presyncope, hypotension and disequilibrium.  She also has recurrent urinary tract infection with sepsis. She was noted to have fever and tachycardia on arrival to ER.  She had abnormal CT chest with findings of subsolid opacification of RLL and RUL and small nodules , all on right lung. She has not had cancer in the past but had stage 4/5 pap smear at young age and opted for hysterctomy..  She endorses heavy smoke second had exposure but never smoked herself.   01/06/24- patient reports she continues to have sever cough but its dry.  She is anxious and her children are anxious due to loss of patients husband last week.  They worry about cancer diagnosis.  Thery request to have lung biopsy to be done asap. I have discussed this with infectious disease specialist and atending physician in charge and we are in agreement to do this when possible.       01/07/24- patient appears to be mildly improved.  She does report some vaginal spotting.  Her lungs appear to be improved in parallel to her cough.  She reports quite a bit of anxiety sharing she's unable to sleep.  We have bronchoscopy scheduled for Monday.  Its unusual for asthma to show up as a focal masslike infiltrate however she may have neoplasm related lesion, vasculitic process such as EGPA and DIPNECH.  We have bronchoscpy scheduled for Monday afternoon.  Patient is requesting definitive biopsy evidence of absence of cancer.  Patient is appreciative of care.   01/09/24- patient sitting up in bed in NAD.  Her cough has resolved now.  Lung  examination is clear bilaterally.  Discussed marked clinical and radiographic improvement with patient.  She is currently NPO for bronchoscopy.  At this point I feel the invasive procedure would be of little benefit to patient.  We have discussed this and will cancel bronchoscopy. She has cxr this am to confirm progressive radiographic improvement in paralell to inflammatory biomarkers and clinical status.  Will follow up on outpatient.  Plan to continue steroids for 10 more days with taper as ordered PO currently.    PAST MEDICAL HISTORY   Past Medical History:  Diagnosis Date   Asthma    COVID-19 11/2019   hospitalized x2   GERD (gastroesophageal reflux disease)    Headache    Hypertension    Sleep apnea      SURGICAL HISTORY   Past Surgical History:  Procedure Laterality Date   ABDOMINAL HYSTERECTOMY     APPENDECTOMY     CATARACT EXTRACTION W/PHACO Left 06/10/2021   Procedure: CATARACT EXTRACTION PHACO AND INTRAOCULAR LENS PLACEMENT (IOC) LEFT;  Surgeon: Mittie Gaskin, MD;  Location: [facility];  Service: Ophthalmology;  Laterality: Left;  5.69 0:53.6 10.6%   CATARACT EXTRACTION W/PHACO Right 06/24/2021   Procedure: CATARACT EXTRACTION PHACO AND INTRAOCULAR LENS PLACEMENT (IOC) RIGHT;  Surgeon: Mittie Gaskin, MD;  Location: [facility];  Service: Ophthalmology;  Laterality: Right;  3.61 00:52.4   CHOLECYSTECTOMY     ELBOW BURSA SURGERY Right    FRENULECTOMY, LINGUAL     and removal of portion of soft palate   LUMBAR DISC SURGERY  1996   L3-L5   LUMBAR DISC SURGERY  2004   LUMBAR FUSION  2006   bone harvest rods (2) and screws (36)   LUMBAR LAMINECTOMY  1998   L3-L5   MAXILLARY ANTROSTOMY Right 08/12/2022   Procedure: MAXILLARY ANTROSTOMY REVISION;  Surgeon: Edda Mt, MD;  Location: [facility];  Service: ENT;  Laterality: Right;  placed disk on OR charge nurse desk 8-8 kp   OOPHORECTOMY     1 ovary removed   TOTAL KNEE  ARTHROPLASTY Right 11/17/2021   Procedure: TOTAL KNEE ARTHROPLASTY;  Surgeon: Edie Norleen PARAS, MD;  Location: [facility];  Service: Orthopedics;  Laterality: Right;   UVULOPALATOPHARYNGOPLASTY  2001   XI ROBOTIC LAPAROSCOPIC ASSISTED APPENDECTOMY N/A 05/31/2023   Procedure: XI ROBOTIC LAPAROSCOPIC ASSISTED APPENDECTOMY;  Surgeon: Desiderio Schanz, MD;  Location: [facility];  Service: General;  Laterality: N/A;     FAMILY HISTORY   Family History  Problem Relation Age of Onset   Emphysema Mother    Parkinson's disease Father    Dementia Father    Hypertension Father    Breast cancer Neg Hx      SOCIAL HISTORY   Social History   Tobacco Use   Smoking status: Never    Passive exposure: Never   Smokeless tobacco: Never  Vaping Use   Vaping status: Never Used  Substance Use Topics   Alcohol use: Not Currently    Alcohol/week: 3.0 standard drinks of alcohol    Types: 3 Glasses of wine per week   Drug use: Not Currently     MEDICATIONS    Home Medication:    Current Medication:  Current Facility-Administered Medications:    acetaminophen  (TYLENOL ) tablet 650 mg, 650 mg, Oral, Q6H PRN, 650 mg at 01/05/24 0503 **OR** acetaminophen  (TYLENOL ) suppository 650 mg, 650 mg, Rectal, Q6H PRN, Jody Mario GAILS, MD   albuterol  (PROVENTIL ) (2.5 MG/3ML) 0.083% nebulizer solution 2.5 mg, 2.5 mg, Nebulization, Q4H PRN, Fausto Sor A, DO, 2.5 mg at 01/05/24 1537   ALPRAZolam  (XANAX ) tablet 0.25 mg, 0.25 mg, Oral, TID PRN, Fausto Sor A, DO, 0.25 mg at 01/08/24 2306   benzonatate  (TESSALON ) capsule 200 mg, 200 mg, Oral, TID, Fausto Sor A, DO, 200 mg at 01/08/24 2019   buPROPion  (WELLBUTRIN  XL) 24 hr tablet 150 mg, 150 mg, Oral, Daily, Fausto Sor A, DO, 150 mg at 01/08/24 1251   chlorpheniramine-HYDROcodone  (TUSSIONEX) 10-8 MG/5ML suspension 5 mL, 5 mL, Oral, Q12H PRN, Fausto Sor A, DO, 5 mL at 01/05/24 0018   cyclobenzaprine  (FLEXERIL ) tablet 5 mg, 5 mg, Oral, TID PRN, Fausto Sor A, DO   dexamethasone  (DECADRON ) injection 8 mg, 8 mg, Intravenous, Q24H, Dawnna Gritz, MD, 8 mg at 01/08/24 1638   enoxaparin  (LOVENOX ) injection 40 mg, 40 mg, Subcutaneous, Q24H, Patel, Kishan S, RPH, 40 mg at 01/08/24 2020   estradiol  (ESTRACE ) tablet 0.5 mg, 0.5 mg, Oral, Daily, Fausto Sor A, DO, 0.5 mg at 01/08/24 1251   estradiol  (ESTRACE ) vaginal cream 1 Applicatorful, 1 Applicatorful, Vaginal, Once per day on Monday Wednesday Friday, Fausto Sor A, DO, 1 Applicatorful at 01/08/24 2020   guaiFENesin  (ROBITUSSIN) 100 MG/5ML liquid 5 mL, 5 mL, Oral, Q4H PRN, Jens Durand, MD, 5 mL at 01/04/24 2133   hydrALAZINE  (APRESOLINE ) tablet  25 mg, 25 mg, Oral, Q8H PRN, Fausto Sor A, DO   ketorolac  (TORADOL ) 15 MG/ML injection 15 mg, 15 mg, Intravenous, Q6H PRN, Fausto Sor A, DO, 15 mg at 01/05/24 1337   montelukast  (SINGULAIR ) tablet 10 mg, 10 mg, Oral, QHS, Fausto Sor A, DO, 10 mg at 01/08/24 2020   ondansetron  (ZOFRAN ) injection 4 mg, 4 mg, Intravenous, Q6H PRN, Jens Durand, MD, 4 mg at 01/04/24 1805   pantoprazole  (PROTONIX ) EC tablet 40 mg, 40 mg, Oral, BID, Meegan, Eryn, RPH, 40 mg at 01/08/24 2020   polyethylene glycol (MIRALAX  / GLYCOLAX ) packet 17 g, 17 g, Oral, Daily, Fausto Sor A, DO, 17 g at 01/08/24 0840   senna-docusate (Senokot-S) tablet 1 tablet, 1 tablet, Oral, BID, Fausto Sor LABOR, DO, 1 tablet at 01/08/24 0841   traZODone  (DESYREL ) tablet 50 mg, 50 mg, Oral, QHS PRN, Fausto Sor LABOR, DO   venlafaxine  XR (EFFEXOR -XR) 24 hr capsule 150 mg, 150 mg, Oral, Q breakfast, Jody Modest V, MD, 150 mg at 01/08/24 0841    ALLERGIES   Nirmatrelvir-ritonavir, Atorvastatin , and Meloxicam     REVIEW OF SYSTEMS    Review of Systems:  Gen:  Denies  fever, sweats, chills weigh loss  HEENT: Denies blurred vision, double vision, ear pain, eye pain, hearing loss, nose bleeds, sore throat Cardiac:  No dizziness, chest pain or heaviness, chest  tightness,edema Resp:   reports dyspnea chronically  Gi: Denies swallowing difficulty, stomach pain, nausea or vomiting, diarrhea, constipation, bowel incontinence Gu:  Denies bladder incontinence, burning urine Ext:   Denies Joint pain, stiffness or swelling Skin: Denies  skin rash, easy bruising or bleeding or hives Endoc:  Denies polyuria, polydipsia , polyphagia or weight change Psych:   Denies depression, insomnia or hallucinations   Other:  All other systems negative   VS: BP 128/65   Pulse 62   Temp (!) 97.5 F (36.4 C) (Oral)   Resp 18   Ht 5' 5 (1.651 m)   Wt 78.2 kg   SpO2 98%   BMI 28.67 kg/m      PHYSICAL EXAM    GENERAL:NAD, no fevers, chills, no weakness no fatigue HEAD: Normocephalic, atraumatic.  EYES: Pupils equal, round, reactive to light. Extraocular muscles intact. No scleral icterus.  MOUTH: Moist mucosal membrane. Dentition intact. No abscess noted.  EAR, NOSE, THROAT: Clear without exudates. No external lesions.  NECK: Supple. No thyromegaly. No nodules. No JVD.  PULMONARY: decreased breath sounds with mild rhonchi worse at bases bilaterally.  CARDIOVASCULAR: S1 and S2. Regular rate and rhythm. No murmurs, rubs, or gallops. No edema. Pedal pulses 2+ bilaterally.  GASTROINTESTINAL: Soft, nontender, nondistended. No masses. Positive bowel sounds. No hepatosplenomegaly.  MUSCULOSKELETAL: No swelling, clubbing, or edema. Range of motion full in all extremities.  NEUROLOGIC: Cranial nerves II through XII are intact. No gross focal neurological deficits. Sensation intact. Reflexes intact.  SKIN: No ulceration, lesions, rashes, or cyanosis. Skin warm and dry. Turgor intact.  PSYCHIATRIC: Mood, affect within normal limits. The patient is awake, alert and oriented x 3. Insight, judgment intact.       IMAGING     ASSESSMENT/PLAN    Atypical pneumonia with infiltrative mass  - patient has long history of asthma and recurrent bronchitis, there is  possible overying BOOP, DIPNECH, EGPA and cannot rule out neoplasm as indicated by radiologist   - she may have organizing pneumonia post infectious process, recently she recalls being sick with COVID 19 in 2024   - she  is at risk for aspergillus due to asthma   - she denies cancer history and denies personal history of smoking , but does endorse heavy second hand smoke exposure   - at this time covering with augmentin  would be appropriate.  I discussed this with Dr Fayette infectious disease.  She does have hiatal hernia and reports episodes of regurgitation.  She reports going to [facility] for funduplication but she had delayed gastric emptying so this was cancelled.     -she would benefit from steroids due to underlying asthma , she reports responding well to steroids in the past  - she reports recurrent UTI  - we discussed potential need for bronchoscopy and we will do this post treatment and re-imaging.   -aspergillus and cryptococcal serology due to underlying chronic lung disease  - CRP trend daily is improved  - duoneb therapy while hospitalized  - respiratory cultures ordered        Thank you for allowing me to participate in the care of this patient.   Patient/Family are satisfied with care plan and all questions have been answered.    Provider disclosure: Patient with at least one acute or chronic illness or injury that poses a threat to life or bodily function and is being managed actively during this encounter.  All of the below services have been performed independently by signing provider:  review of prior documentation from internal and or external health records.  Review of previous and current lab results.  Interview and comprehensive assessment during patient visit today. Review of current and previous chest radiographs/CT scans. Discussion of management and test interpretation with health care team and patient/family.   This document was prepared using Dragon voice  recognition software and may include unintentional dictation errors.     Elidia Bonenfant, M.D.  Division of Pulmonary & Critical Care Medicine

## 2024-01-09 NOTE — Progress Notes (Signed)
 AVS reviewed with patient; all questions answered. A copy of AVS given to patient to take home. Patient verbalize understanding of content of discharge instructions. Patient waiting on transportation by daughter.

## 2024-01-09 NOTE — Discharge Summary (Signed)
 Physician Discharge Summary   Patient: Jody Brewer MRN: [id number] DOB: 1951-05-20  Admit date:     01/02/2024  Discharge date: 01/09/2024  Discharge Physician: Burnard DELENA Cunning   PCP: Cleotilde Oneil FALCON, MD   Recommendations at discharge:   Follow up closely with Pulmonology Follow up with Primary Care in 1-2 weeks Repeat CBC, BMP at follow up Repeat chest imaging -- defer to Pulmonology    Discharge Diagnoses: Active Problems:   Anemia   GERD (gastroesophageal reflux disease)   Multinodular goiter   URI (upper respiratory infection)   Organizing pneumonia (HCC)  Principal Problem (Resolved):   Fever Resolved Problems:   AKI (acute kidney injury) (HCC)   Dizziness   Hypotension   Headache  Hospital Course:   Jody Brewer is a 73 y.o. female with medical history significant for asthma, history of COVID-19 infection requiring hospitalization, headache, hypertension, GERD, sleep apnea, recurrent UTIs (recently prescribed Augmentin  for UTI on 12/14/2023).  She presented to the hospital because of general body aches, fatigue, chills, dizziness, fever, dry cough, nausea, poor appetite.    She was febrile in the ED with temperature of 101.2 F and hypotensive with blood pressure down to 74/36.   Patient was admitted for further evaluation and management of recurring fevers of unclear cause in addition to upper respiratory symptoms.   Infectious disease was consulted.   Further hospital course and management as outlined below  01/09/24 -- pt overall feels well today, has ongoing intermittent cough and dyspnea but has improved significantly.  No fevers.  Seen by Pulmonology this AM, bronchoscopy was cancelled due to excellect clinical response to steroids so far.  Pt was agreeable with discharging home on steroid taper, to be seen in very close follow up by Pulmonology later this week or first of next week.  Pt is medically stable for discharge home today.  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  Chest x-ray is negative.  Quantiferon Gold NEGATIVE  --Infectious disease and Pulmonology consulted  --Started IV Decadron  8 mg daily on 1/9  --Discharge on Prednisone  taper over 10 days, starting 50 mg today, reduce by 5mg  every two days --Pulmonology to arrange close follow up --Briefly on Unasyn  but this was stopped given low suspicion for bacterial infection, neg procal --Robitussin, Tessalon  and Tussionex PRN for cough --Tylenol  as needed for fever --Albuterol  nebs PRN   History of recurrent UTI, recent urine culture from 12/14/2023 showed E. coli that was treated with Augmentin : Urinalysis on this admission was unremarkable.  Urine culture no  growth.   Hypotension: Resolved with IV fluids  BP's stable with meds on hold here. --Monitor BP closely. --Minoxidil and olmesartan -HCTZ on hold -- continue holding on discharge --Home BP monitoring and close PCP follow up recommended   Normocytic anemia -- suspect dilution with initial IV fluids, possible also infection contributing to myelosuppression.  Hbg trend: 11 >> 8.4 >> 8.6 >> 9.7 >> 9.3 --Follow CBC's    AKI: resolved with fluids. --Monitor BMP   Anxiety / Insomnia - related to stress of current situation --Low dose Xanax  PRN anxiety or sleep --Trazodone  PRN sleep if not using Xanax  or its ineffective     Comorbidities include GERD, multinodular goiter, history of  hypertension, headache, history of acute appendicitis s/p appendectomy June 2024   --Resumed on home estrogen pill & cream, wellbutrin , Singulair          Consultants: Pulmonology, Infectious disease Procedures performed: none  Disposition: Home Diet recommendation:  Discharge Diet Orders  (From admission, onward)     Start     Ordered   01/09/24 0000  Diet - low sodium heart healthy        01/09/24 1317            DISCHARGE MEDICATION: Allergies as of 01/09/2024       Reactions   Nirmatrelvir-ritonavir Nausea Only    Severe nausea and vomiting.   Atorvastatin  Palpitations, Other (See Comments)   Muscle pain and severe twitching   Meloxicam Rash   Tolerates ibuprofen         Medication List     STOP taking these medications    amoxicillin -clavulanate 875-125 MG tablet Commonly known as: AUGMENTIN    minoxidil 2.5 MG tablet Commonly known as: LONITEN   nitrofurantoin  (macrocrystal-monohydrate) 100 MG capsule Commonly known as: MACROBID    olmesartan -hydrochlorothiazide  20-12.5 MG tablet Commonly known as: BENICAR  HCT   zinc  sulfate (50mg  elemental zinc ) 220 (50 Zn) MG capsule       TAKE these medications    albuterol  108 (90 Base) MCG/ACT inhaler Commonly known as: VENTOLIN  HFA Inhale 2 puffs into the lungs every 4 (four) hours as needed for wheezing or shortness of breath.   ALPRAZolam  0.25 MG tablet Commonly known as: XANAX  Take 1 tablet (0.25 mg total) by mouth 3 (three) times daily as needed for anxiety or sleep.   buPROPion  150 MG 24 hr tablet Commonly known as: WELLBUTRIN  XL Take 150 mg by mouth daily.   Dialyvite Vitamin D  5000 125 MCG (5000 UT) capsule Generic drug: Cholecalciferol  Take 5,000 Units by mouth daily.   estradiol  0.1 MG/GM vaginal cream Commonly known as: ESTRACE  Estrogen Cream Instruction Discard applicator Apply pea sized amount to tip of finger to urethra before bed. Wash hands well after application. Use Monday, Wednesday and Friday   estradiol  0.5 MG tablet Commonly known as: ESTRACE  Take 0.5 mg by mouth daily.   ferrous sulfate 325 (65 FE) MG EC tablet Take 325 mg by mouth. Mondays/Wednesday/friday   gabapentin  300 MG capsule Commonly known as: NEURONTIN  Take 300 mg by mouth at bedtime.   ibuprofen   800 MG tablet Commonly known as: ADVIL  Take 1 tablet by mouth 2 (two) times daily as needed.   montelukast  10 MG tablet Commonly known as: SINGULAIR  Take 10 mg by mouth daily.   Multi-Vitamin tablet Take 1 tablet by mouth daily.   nortriptyline  10 MG capsule Commonly known as: PAMELOR  Take 20 mg by mouth at bedtime.   pantoprazole  40 MG tablet Commonly known as: PROTONIX  Take 40 mg by mouth 2 (two) times daily.   predniSONE  10 MG tablet Commonly known as: DELTASONE  Take 5 tablets (50 mg total) by mouth daily with breakfast for 1 day, THEN 4.5 tablets (45 mg total) daily with breakfast for 1 day, THEN 4 tablets (40 mg total) daily with breakfast for 1 day, THEN 3.5 tablets (35 mg total) daily with breakfast for 1 day, THEN 3 tablets (30 mg total) daily with breakfast for 1 day, THEN 2.5 tablets (25 mg total) daily with breakfast for 1 day, THEN 2 tablets (20 mg total) daily with breakfast for 1 day, THEN 1.5 tablets (15 mg total) daily with breakfast for 1 day, THEN 1 tablet (10  mg total) daily with breakfast for 1 day, THEN 0.5 tablets (5 mg total) daily with breakfast for 1 day. Take one tablet (50 mg) by mouth daily with breakfast. Start taking on: January 09, 2024   venlafaxine  XR 150 MG 24 hr capsule Commonly known as: EFFEXOR -XR Take 150 mg by mouth daily with breakfast.   vitamin E  180 MG (400 UNITS) capsule Take 400 Units by mouth 2 (two) times daily.        Follow-up Information     Parris Manna, MD Follow up.   Specialty: Pulmonary Disease Contact information: [address] [place] [address] [phone]                Discharge Exam: Fredricka Weights   01/07/24 0600 01/08/24 0431 01/09/24 0242  Weight: 78.9 kg 81.3 kg 78.2 kg   General exam: awake, alert, no acute distress HEENT: atraumatic, clear conjunctiva, anicteric sclera, moist mucus membranes, hearing grossly normal  Respiratory system: CTAB, no wheezes, rales or rhonchi, normal  respiratory effort. Cardiovascular system: normal S1/S2, RRR, no JVD, murmurs, rubs, gallops,  no pedal edema.   Gastrointestinal system: soft, NT, ND, no HSM felt, +bowel sounds. Central nervous system: A&O x 4. no gross focal neurologic deficits, normal speech Extremities: moves all, no edema, normal tone Skin: dry, intact, normal temperature, normal color, No rashes, lesions or ulcers Psychiatry: normal mood, congruent affect, judgement and insight appear normal   Condition at discharge: stable  The results of significant diagnostics from this hospitalization (including imaging, microbiology, ancillary and laboratory) are listed below for reference.   Imaging Studies: DG Chest Port 1 View Result Date: 01/09/2024 CLINICAL DATA:  73 year old female with abnormal chest CT, with regression on 01/06/2014. EXAM: PORTABLE CHEST 1 VIEW COMPARISON:  Chest CT 01/07/2024 and earlier. FINDINGS: Portable AP upright view at 0906 hours. Lung volumes and mediastinal contours not significantly changed. Small layering pleural effusions appears stable. No pneumothorax. Right perihilar ventilation appears mildly improved since 01/02/2024. Otherwise lung markings are stable. No pulmonary edema or consolidation. Thoracolumbar posterior spinal fusion hardware again noted. Negative visible bowel gas. IMPRESSION: Ongoing small pleural effusions. Improved right perihilar ventilation since 01/02/2024. No new cardiopulmonary abnormality. Electronically Signed   By: VEAR Hurst M.D.   On: 01/09/2024 09:47   CT SUPER D CHEST WO MONARCH PILOT Result Date: 01/07/2024 CLINICAL DATA:  Abnormal chest CT. Indeterminate pulmonary lesion. Headache EXAM: CT CHEST WITHOUT CONTRAST TECHNIQUE: Multidetector CT imaging of the chest was performed using thin slice collimation for electromagnetic bronchoscopy planning purposes, without intravenous contrast. RADIATION DOSE REDUCTION: This exam was performed according to the departmental  dose-optimization program which includes automated exposure control, adjustment of the mA and/or kV according to patient size and/or use of iterative reconstruction technique. COMPARISON:  CT chest 01/04/2024 FINDINGS: Cardiovascular: No significant vascular findings. Normal heart size. No pericardial effusion. Mediastinum/Nodes: No axillary or supraclavicular adenopathy. No mediastinal or hilar adenopathy. No pericardial fluid. Esophagus normal. Lungs/Pleura: Small bilateral pleural effusions. Regional focus of airspace disease in the RIGHT upper lobe measuring 5.4 x 6.0 cm and is decreased in density compared to CT 3 days prior (image 56/3). Segmental airspace density in a bronchovascular pattern in the RIGHT lower lobe is also decreased slightly in density compared to prior. Imaging findings of the RIGHT lower lobe process are similar to the upper lobe process. Upper Abdomen: Limited view of the liver, kidneys, pancreas are unremarkable. Normal adrenal glands. Musculoskeletal: No aggressive osseous lesion. IMPRESSION: Decreased density of regional and  segmental airspace disease in the RIGHT upper lobe and RIGHT lower lobe is most suggestive of infectious inflammatory process. Electronically Signed   By: Jackquline Boxer M.D.   On: 01/07/2024 12:01   CT CHEST ABDOMEN PELVIS W CONTRAST Result Date: 01/04/2024 CLINICAL DATA:  Fevers of unknown origin for 6 months * Tracking Code: BO * EXAM: CT CHEST, ABDOMEN, AND PELVIS WITH CONTRAST TECHNIQUE: Multidetector CT imaging of the chest, abdomen and pelvis was performed following the standard protocol during bolus administration of intravenous contrast. RADIATION DOSE REDUCTION: This exam was performed according to the departmental dose-optimization program which includes automated exposure control, adjustment of the mA and/or kV according to patient size and/or use of iterative reconstruction technique. CONTRAST:  OMNIPAQUE  IOHEXOL  300 MG/ML SOLN additional oral  enteric contrast COMPARISON:  CT abdomen pelvis, 07/15/2023 FINDINGS: CT CHEST FINDINGS Cardiovascular: No significant vascular findings. Normal heart size. No pericardial effusion. Mediastinum/Nodes: No enlarged mediastinal, hilar, or axillary lymph nodes. Small hiatal hernia thyroid  gland, trachea, and esophagus demonstrate no significant findings. Lungs/Pleura: Large, irregular subsolid opacity of the posterior right upper lobe measuring (series 4, image 67) as well as an additional large irregular subpleural opacity of the dependent right lower lobe measuring 5.6 x 5.4 cm (series 4, image 76). Multiple small right pulmonary nodules, for example in the right lung base measuring 0.5 cm (series 4, image 42) and in the anterior right upper lobe measuring 0.7 cm (series 4, image 42). Small bilateral pleural effusions. Musculoskeletal: No chest wall abnormality. No acute osseous findings. CT ABDOMEN PELVIS FINDINGS Hepatobiliary: No focal liver abnormality is seen. Status post cholecystectomy. Postoperative biliary ductal dilatation. Pancreas: Unremarkable. No pancreatic ductal dilatation or surrounding inflammatory changes. Spleen: Normal in size without significant abnormality. Adrenals/Urinary Tract: Adrenal glands are unremarkable. Kidneys are normal, without renal calculi, solid lesion, or hydronephrosis. Bladder is unremarkable. Stomach/Bowel: Stomach is within normal limits. Status post appendectomy. No evidence of bowel wall thickening, distention, or inflammatory changes. Sigmoid diverticula Vascular/Lymphatic: Aortic atherosclerosis. No enlarged abdominal or pelvic lymph nodes. Reproductive: Status post hysterectomy. Other: No abdominal wall hernia or abnormality. No ascites. Musculoskeletal: No acute osseous findings. Posterior thoracolumbar fusion T9-S1. IMPRESSION: 1. Large, irregular subsolid opacity of the posterior right upper lobe measuring as well as an additional large irregular subpleural opacity  of the dependent right lower lobe. These are in general most likely to be infectious or inflammatory, general differential considerations including unusual etiology such as eosinophilic or organizing pneumonias, however given morphology and unilateral distribution, infiltrative adenocarcinoma is distinctly not excluded. 2. Multiple small right pulmonary nodules measuring up to 0.7 cm, nonspecific. Attention on follow-up. 3. Small bilateral pleural effusions. 4. No evidence of lymphadenopathy or metastatic disease in the abdomen or pelvis. 5. Status post cholecystectomy, appendectomy, and hysterectomy. 6. Sigmoid diverticulosis without evidence of acute diverticulitis. Aortic Atherosclerosis (ICD10-I70.0). Electronically Signed   By: Marolyn JONETTA Jaksch M.D.   On: 01/04/2024 20:54   ECHOCARDIOGRAM COMPLETE Result Date: 01/03/2024    ECHOCARDIOGRAM REPORT   Patient Name:   Jody Brewer Date of Exam: 01/03/2024 Medical Rec #:  [id number]          Height:       65.0 in Accession #:    [id number]         Weight:       170.0 lb Date of Birth:  [birth year]         BSA:          1.846 m Patient Age:  72 years           BP:           105/49 mmHg Patient Gender: F                  HR:           95 bpm. Exam Location:  [facility] Procedure: 2D Echo, Cardiac Doppler and Color Doppler Indications:     Dizziness, Shortness of Breath  History:         Patient has prior history of Echocardiogram examinations, most                  recent 01/16/2020. Signs/Symptoms:Dizziness/Lightheadedness,                  Shortness of Breath and Chest Pain; Risk Factors:Hypertension,                  Dyslipidemia and Sleep Apnea.  Sonographer:     Naomie Reef Referring Phys:  [id number] MARIO GAILS PATEL Diagnosing Phys: Marsa Dooms MD  Sonographer Comments: Image acquisition challenging due to respiratory motion. IMPRESSIONS  1. Left ventricular ejection fraction, by estimation, is 65 to 70%. The left ventricle has normal function. The left  ventricle has no regional wall motion abnormalities. Left ventricular diastolic parameters are consistent with Grade I diastolic dysfunction (impaired relaxation).  2. Right ventricular systolic function is normal. The right ventricular size is normal. There is normal pulmonary artery systolic pressure.  3. The mitral valve is normal in structure. Trivial mitral valve regurgitation. No evidence of mitral stenosis.  4. The aortic valve is normal in structure. Aortic valve regurgitation is not visualized. No aortic stenosis is present.  5. The inferior vena cava is normal in size with greater than 50% respiratory variability, suggesting right atrial pressure of 3 mmHg. FINDINGS  Left Ventricle: Left ventricular ejection fraction, by estimation, is 65 to 70%. The left ventricle has normal function. The left ventricle has no regional wall motion abnormalities. The left ventricular internal cavity size was normal in size. There is  no left ventricular hypertrophy. Left ventricular diastolic parameters are consistent with Grade I diastolic dysfunction (impaired relaxation). Right Ventricle: The right ventricular size is normal. No increase in right ventricular wall thickness. Right ventricular systolic function is normal. There is normal pulmonary artery systolic pressure. The tricuspid regurgitant velocity is 2.64 m/s, and  with an assumed right atrial pressure of 8 mmHg, the estimated right ventricular systolic pressure is 35.9 mmHg. Left Atrium: Left atrial size was normal in size. Right Atrium: Right atrial size was normal in size. Pericardium: There is no evidence of pericardial effusion. Mitral Valve: The mitral valve is normal in structure. Trivial mitral valve regurgitation. No evidence of mitral valve stenosis. MV peak gradient, 10.9 mmHg. The mean mitral valve gradient is 5.0 mmHg. Tricuspid Valve: The tricuspid valve is normal in structure. Tricuspid valve regurgitation is trivial. No evidence of tricuspid  stenosis. Aortic Valve: The aortic valve is normal in structure. Aortic valve regurgitation is not visualized. No aortic stenosis is present. Aortic valve mean gradient measures 7.0 mmHg. Aortic valve peak gradient measures 14.1 mmHg. Aortic valve area, by VTI measures 2.39 cm. Pulmonic Valve: The pulmonic valve was normal in structure. Pulmonic valve regurgitation is not visualized. No evidence of pulmonic stenosis. Aorta: The aortic root is normal in size and structure. Venous: The inferior vena cava is normal in size with greater than 50% respiratory variability, suggesting right atrial pressure  of 3 mmHg. IAS/Shunts: No atrial level shunt detected by color flow Doppler.  LEFT VENTRICLE PLAX 2D LVIDd:         3.70 cm   Diastology LVIDs:         2.20 cm   LV e' lateral:   14.10 cm/s LV PW:         1.20 cm   LV E/e' lateral: 9.1 LV IVS:        1.10 cm LVOT diam:     2.00 cm LV SV:         99 LV SV Index:   53 LVOT Area:     3.14 cm  RIGHT VENTRICLE RV Basal diam:  3.55 cm RV Mid diam:    2.90 cm RV S prime:     21.70 cm/s TAPSE (M-mode): 2.6 cm LEFT ATRIUM             Index        RIGHT ATRIUM           Index LA diam:        3.10 cm 1.68 cm/m   RA Area:     13.90 cm LA Vol (A2C):   54.0 ml 29.25 ml/m  RA Volume:   34.00 ml  18.42 ml/m LA Vol (A4C):   54.2 ml 29.36 ml/m LA Biplane Vol: 58.7 ml 31.80 ml/m  AORTIC VALVE                     PULMONIC VALVE AV Area (Vmax):    2.66 cm      PV Vmax:       1.17 m/s AV Area (Vmean):   2.45 cm      PV Peak grad:  5.5 mmHg AV Area (VTI):     2.39 cm AV Vmax:           188.00 cm/s AV Vmean:          125.000 cm/s AV VTI:            0.412 m AV Peak Grad:      14.1 mmHg AV Mean Grad:      7.0 mmHg LVOT Vmax:         159.00 cm/s LVOT Vmean:        97.400 cm/s LVOT VTI:          0.314 m LVOT/AV VTI ratio: 0.76  AORTA Ao Root diam: 3.20 cm MITRAL VALVE                TRICUSPID VALVE MV Area (PHT): 5.23 cm     TR Peak grad:   27.9 mmHg MV Area VTI:   2.77 cm     TR Vmax:         264.00 cm/s MV Peak grad:  10.9 mmHg MV Mean grad:  5.0 mmHg     SHUNTS MV Vmax:       1.65 m/s     Systemic VTI:  0.31 m MV Vmean:      102.0 cm/s   Systemic Diam: 2.00 cm MV Decel Time: 145 msec MV E velocity: 128.00 cm/s MV A velocity: 164.00 cm/s MV E/A ratio:  0.78 Marsa Dooms MD Electronically signed by Marsa Dooms MD Signature Date/Time: 01/03/2024/1:52:12 PM    Final    CT HEAD WO CONTRAST ( ) Result Date: 01/02/2024 CLINICAL DATA:  Migraine headaches EXAM: CT HEAD WITHOUT CONTRAST TECHNIQUE: Contiguous axial images were obtained from the base of the skull  through the vertex without intravenous contrast. RADIATION DOSE REDUCTION: This exam was performed according to the departmental dose-optimization program which includes automated exposure control, adjustment of the mA and/or kV according to patient size and/or use of iterative reconstruction technique. COMPARISON:  11/20/2021 FINDINGS: Brain: No evidence of acute infarction, hemorrhage, hydrocephalus, extra-axial collection or mass lesion/mass effect. Vascular: No hyperdense vessel or unexpected calcification. Skull: Normal. Negative for fracture or focal lesion. Sinuses/Orbits: Orbits and their contents are within normal limits. Paranasal sinuses demonstrate postsurgical changes with mild inflammatory changes in the right maxillary antrum. Other: None. IMPRESSION: No acute intracranial abnormality noted. Mild inflammatory changes in the right maxillary antrum. Electronically Signed   By: Oneil Devonshire M.D.   On: 01/02/2024 22:12   DG Chest 2 View Result Date: 01/02/2024 CLINICAL DATA:  Shortness of breath and bilateral chest pain EXAM: CHEST - 2 VIEW COMPARISON:  01/23/2023 FINDINGS: Thoracolumbar spine fixation. Midline trachea. Normal heart size and mediastinal contours for age. No pleural effusion or pneumothorax. Mild right hemidiaphragm elevation. Clear lungs. IMPRESSION: No active cardiopulmonary disease. Electronically  Signed   By: Rockey Kilts M.D.   On: 01/02/2024 14:53    Microbiology: Results for orders placed or performed during the hospital encounter of 01/02/24  Resp panel by RT-PCR (RSV, Flu A&B, Covid) Anterior Nasal Swab     Status: None   Collection Time: 01/02/24  1:45 PM   Specimen: Anterior Nasal Swab  Result Value Ref Range Status   SARS Coronavirus 2 by RT PCR NEGATIVE NEGATIVE Final    Comment: (NOTE) SARS-CoV-2 target nucleic acids are NOT DETECTED.  The SARS-CoV-2 RNA is generally detectable in upper respiratory specimens during the acute phase of infection. The lowest concentration of SARS-CoV-2 viral copies this assay can detect is 138 copies/mL. A negative result does not preclude SARS-Cov-2 infection and should not be used as the sole basis for treatment or other patient management decisions. A negative result may occur with  improper specimen collection/handling, submission of specimen other than nasopharyngeal swab, presence of viral mutation(s) within the areas targeted by this assay, and inadequate number of viral copies(<138 copies/mL). A negative result must be combined with clinical observations, patient history, and epidemiological information. The expected result is Negative.  Fact Sheet for Patients:  bloggercourse.com  Fact Sheet for Healthcare Providers:  seriousbroker.it  This test is no t yet approved or cleared by the [place]  FDA and  has been authorized for detection and/or diagnosis of SARS-CoV-2 by FDA under an Emergency Use Authorization (EUA). This EUA will remain  in effect (meaning this test can be used) for the duration of the COVID-19 declaration under Section 564(b)(1) of the Act, 21 U.S.C.section 360bbb-3(b)(1), unless the authorization is terminated  or revoked sooner.       Influenza A by PCR NEGATIVE NEGATIVE Final   Influenza B by PCR NEGATIVE NEGATIVE Final    Comment: (NOTE) The  Xpert Xpress SARS-CoV-2/FLU/RSV plus assay is intended as an aid in the diagnosis of influenza from Nasopharyngeal swab specimens and should not be used as a sole basis for treatment. Nasal washings and aspirates are unacceptable for Xpert Xpress SARS-CoV-2/FLU/RSV testing.  Fact Sheet for Patients: bloggercourse.com  Fact Sheet for Healthcare Providers: seriousbroker.it  This test is not yet approved or cleared by the [place]  FDA and has been authorized for detection and/or diagnosis of SARS-CoV-2 by FDA under an Emergency Use Authorization (EUA). This EUA will remain in effect (meaning this test can be used) for the  duration of the COVID-19 declaration under Section 564(b)(1) of the Act, 21 U.S.C. section 360bbb-3(b)(1), unless the authorization is terminated or revoked.     Resp Syncytial Virus by PCR NEGATIVE NEGATIVE Final    Comment: (NOTE) Fact Sheet for Patients: bloggercourse.com  Fact Sheet for Healthcare Providers: seriousbroker.it  This test is not yet approved or cleared by the [place]  FDA and has been authorized for detection and/or diagnosis of SARS-CoV-2 by FDA under an Emergency Use Authorization (EUA). This EUA will remain in effect (meaning this test can be used) for the duration of the COVID-19 declaration under Section 564(b)(1) of the Act, 21 U.S.C. section 360bbb-3(b)(1), unless the authorization is terminated or revoked.  Performed at [facility], [address] [place], [place] [address]   Urine Culture     Status: None   Collection Time: 01/02/24  5:15 PM   Specimen: Urine, Clean Catch  Result Value Ref Range Status   Specimen Description   Final    URINE, CLEAN CATCH Performed at [facility], [address], [place], [place] [address]    Special Requests   Final    NONE Performed at [facility], [address], [place], [place] [address]    Culture   Final    NO GROWTH Performed at [facility], [address], [place], [place] [address]    Report Status 01/03/2024 FINAL  Final  Blood culture (routine x 2)     Status: None   Collection Time: 01/02/24  6:26 PM   Specimen: BLOOD  Result Value Ref Range Status   Specimen Description BLOOD RIGHT ARM  Final   Special Requests   Final    BOTTLES DRAWN AEROBIC AND ANAEROBIC Blood Culture adequate volume   Culture   Final    NO GROWTH 5 DAYS Performed at [facility], [address] [place], [place] [address]    Report Status 01/07/2024 FINAL  Final  Blood culture (routine x 2)     Status: None   Collection Time: 01/02/24  6:26 PM   Specimen: BLOOD  Result Value Ref Range Status   Specimen Description BLOOD LEFT ARM  Final   Special Requests   Final    BOTTLES DRAWN AEROBIC AND ANAEROBIC Blood Culture results may not be optimal due to an inadequate volume of blood received in culture bottles   Culture   Final    NO GROWTH 5 DAYS Performed at [facility], [address] [place], [place] [address]    Report Status 01/07/2024 FINAL  Final  Respiratory (~20 pathogens) panel by PCR     Status: None   Collection Time: 01/03/24 10:07 AM   Specimen: Nasopharyngeal Swab; Respiratory  Result Value Ref Range Status   Adenovirus NOT DETECTED NOT DETECTED Final   Coronavirus 229E NOT DETECTED NOT DETECTED Final    Comment: (NOTE) The Coronavirus on the Respiratory Panel, DOES NOT test for the novel  Coronavirus (2019 nCoV)    Coronavirus HKU1 NOT DETECTED NOT DETECTED Final   Coronavirus NL63 NOT DETECTED NOT DETECTED Final   Coronavirus OC43 NOT DETECTED NOT DETECTED Final   Metapneumovirus NOT DETECTED NOT DETECTED Final   Rhinovirus / Enterovirus NOT DETECTED NOT DETECTED Final   Influenza A NOT DETECTED NOT DETECTED Final   Influenza B NOT DETECTED NOT DETECTED Final   Parainfluenza Virus 1 NOT  DETECTED NOT DETECTED Final   Parainfluenza Virus 2 NOT DETECTED NOT DETECTED Final  Parainfluenza Virus 3 NOT DETECTED NOT DETECTED Final   Parainfluenza Virus 4 NOT DETECTED NOT DETECTED Final   Respiratory Syncytial Virus NOT DETECTED NOT DETECTED Final   Bordetella pertussis NOT DETECTED NOT DETECTED Final   Bordetella Parapertussis NOT DETECTED NOT DETECTED Final   Chlamydophila pneumoniae NOT DETECTED NOT DETECTED Final   Mycoplasma pneumoniae NOT DETECTED NOT DETECTED Final    Comment: Performed at [facility], [address], [place], [place] [address]  Urine Culture (for pregnant, neutropenic or urologic patients or patients with an indwelling urinary catheter)     Status: None   Collection Time: 01/07/24  4:15 PM   Specimen: Urine, Clean Catch  Result Value Ref Range Status   Specimen Description   Final    URINE, CLEAN CATCH Performed at [facility], [address], [place], [place] [address]    Special Requests   Final    NONE Performed at [facility], [address], [place], [place] [address]    Culture   Final    NO GROWTH Performed at [facility], [address], [place], [place] [address]    Report Status 01/09/2024 FINAL  Final    Labs: CBC: Recent Labs  Lab 01/04/24 0609 01/06/24 0414 01/07/24 0207 01/09/24 0425  WBC 5.8 4.6 6.6 7.5  NEUTROABS  --   --   --  4.8  HGB 8.6* 9.7* 9.3* 9.9*  HCT 26.1* 27.5* 27.7* 29.4*  MCV 92.2 89.9 92.0 90.7  PLT 206 263 300 377   Basic Metabolic Panel: Recent Labs  Lab 01/04/24 0609 01/05/24 0554 01/07/24 0207  NA 146* 139 137  K 3.3* 4.0 4.1  CL 113* 106 103  CO2 25 24 25   GLUCOSE 89 79 199*  BUN 19 12 27*  CREATININE 1.05* 0.82 1.00  CALCIUM  8.5* 8.5* 9.0  MG  --  2.0  --    Liver Function Tests: No results for input(s): AST, ALT, ALKPHOS, BILITOT, PROT, ALBUMIN in the last 168 hours.  CBG: No results for input(s): GLUCAP in the last 168  hours.  Discharge time spent: greater than 30 minutes.  Signed: Burnard DELENA Cunning, DO [facility] 01/10/2024

## 2024-01-09 NOTE — Plan of Care (Signed)

## 2024-01-10 ENCOUNTER — Encounter: Payer: Self-pay | Admitting: Internal Medicine

## 2024-01-11 LAB — ASPERGILLUS ANTIGEN, BAL/SERUM: Aspergillus Ag, BAL/Serum: 0.03 {index} (ref 0.00–0.49)

## 2024-01-12 ENCOUNTER — Ambulatory Visit: Payer: Medicare Other | Admitting: Urology

## 2024-01-26 ENCOUNTER — Emergency Department: Payer: Medicare Other

## 2024-01-26 ENCOUNTER — Other Ambulatory Visit: Payer: Self-pay

## 2024-01-26 ENCOUNTER — Emergency Department
Admission: EM | Admit: 2024-01-26 | Discharge: 2024-01-26 | Disposition: A | Discharge status: Home / Self Care | Payer: Medicare Other | Attending: Emergency Medicine | Admitting: Emergency Medicine

## 2024-01-26 DIAGNOSIS — R0789 Other chest pain: Secondary | ICD-10-CM | POA: Diagnosis not present

## 2024-01-26 DIAGNOSIS — K2900 Acute gastritis without bleeding: Secondary | ICD-10-CM | POA: Diagnosis not present

## 2024-01-26 DIAGNOSIS — J45909 Unspecified asthma, uncomplicated: Secondary | ICD-10-CM | POA: Insufficient documentation

## 2024-01-26 DIAGNOSIS — I10 Essential (primary) hypertension: Secondary | ICD-10-CM | POA: Diagnosis not present

## 2024-01-26 DIAGNOSIS — R1012 Left upper quadrant pain: Secondary | ICD-10-CM | POA: Diagnosis present

## 2024-01-26 LAB — BASIC METABOLIC PANEL
Anion gap: 12 (ref 5–15)
BUN: 21 mg/dL (ref 8–23)
CO2: 26 mmol/L (ref 22–32)
Calcium: 9.9 mg/dL (ref 8.9–10.3)
Chloride: 100 mmol/L (ref 98–111)
Creatinine, Ser: 1.07 mg/dL — ABNORMAL HIGH (ref 0.44–1.00)
GFR, Estimated: 56 mL/min — ABNORMAL LOW (ref >60)
Glucose, Bld: 113 mg/dL — ABNORMAL HIGH (ref 70–99)
Potassium: 4.2 mmol/L (ref 3.5–5.1)
Sodium: 138 mmol/L (ref 135–145)

## 2024-01-26 LAB — CBC
HCT: 38.4 % (ref 36.0–46.0)
Hemoglobin: 12.8 g/dL (ref 12.0–15.0)
MCH: 32 pg (ref 26.0–34.0)
MCHC: 33.3 g/dL (ref 30.0–36.0)
MCV: 96 fL (ref 80.0–100.0)
Platelets: 269 10*3/uL (ref 150–400)
RBC: 4 MIL/uL (ref 3.87–5.11)
RDW: 15.1 % (ref 11.5–15.5)
WBC: 10.7 10*3/uL — ABNORMAL HIGH (ref 4.0–10.5)
nRBC: 0 % (ref 0.0–0.2)

## 2024-01-26 LAB — TROPONIN I (HIGH SENSITIVITY)
Troponin I (High Sensitivity): 6 ng/L (ref <18)
Troponin I (High Sensitivity): 6 ng/L (ref <18)

## 2024-01-26 MED ORDER — FAMOTIDINE 20 MG PO TABS
20.0000 mg | ORAL_TABLET | Freq: Once | ORAL | Status: AC
  Administered 2024-01-26: 20 mg via ORAL
  Filled 2024-01-26: qty 1

## 2024-01-26 MED ORDER — ALUM & MAG HYDROXIDE-SIMETH 200-200-20 MG/5ML PO SUSP
30.0000 mL | Freq: Once | ORAL | Status: AC
  Administered 2024-01-26: 30 mL via ORAL
  Filled 2024-01-26: qty 30

## 2024-01-26 MED ORDER — LIDOCAINE VISCOUS HCL 2 % MT SOLN
15.0000 mL | OROMUCOSAL | 1 refills | Status: DC | PRN
Start: 2024-01-26 — End: 2024-05-29

## 2024-01-26 MED ORDER — LIDOCAINE VISCOUS HCL 2 % MT SOLN
15.0000 mL | Freq: Once | OROMUCOSAL | Status: AC
  Administered 2024-01-26: 15 mL via ORAL
  Filled 2024-01-26: qty 15

## 2024-01-26 NOTE — ED Notes (Signed)
Called to do EKG. No Answer

## 2024-01-26 NOTE — ED Provider Triage Note (Signed)
Emergency Medicine Provider Triage Evaluation Note  Jody Brewer , a 73 y.o. female  was evaluated in triage.  Pt complains of shortness of breath and not feeling well.  Recently admitted for lung mass and currently taking prednisone.  Abdomen hurts.    Review of Systems  Positive: Abdomen hurts, generalized not feeling well Negative: No fever  Physical Exam  BP 134/87 (BP Location: Right Arm)   Pulse 99   Temp 98.9 F (37.2 C) (Oral)   Resp 20   Ht 5\' 5"  (1.651 m)   Wt 76.7 kg   SpO2 97%   BMI 28.12 kg/m  Gen:   Awake, no distress   Able to talk in complete sentences.   Resp:  Normal effort   No wheezing or rales MSK:   Moves extremities without difficulty  Other:    Medical Decision Making  Medically screening exam initiated at 2:02 PM.  Appropriate orders placed.  Jody Brewer was informed that the remainder of the evaluation will be completed by another provider, this initial triage assessment does not replace that evaluation, and the importance of remaining in the ED until their evaluation is complete.     Tommi Rumps, PA-C 01/26/24 [phone]

## 2024-01-26 NOTE — ED Triage Notes (Signed)
Pt states that she was admitted for 8 days right upper lobe mass that responded to steroids so she is on 50mg  of prednisone daily for 3 weeks, states that she is having pain on the left side of her chest that radiates into her back, states that she thinks it is related to prednisone and gi issues, states that she started taking a probiotic

## 2024-01-26 NOTE — ED Provider Notes (Signed)
[facility] Provider Note    Event Date/Time   First MD Initiated Contact with Patient 01/26/24 1739     (approximate)   History   Chief Complaint Chest Pain   HPI  Jody Brewer is a 73 y.o. female with past medical history of hypertension, hyperlipidemia, asthma, and anemia who presents to the ED complaining of chest pain.  Patient reports that she has been dealing with increasing burning discomfort in her chest for the past 2 days.  Pain will often start in the left upper quadrant of her abdomen and move upwards, all the way into her throat.  She denies any fevers or cough, has not had any difficulty breathing.  She attributed symptoms to the prednisone that she has been taking for organizing pneumonia and pain seems to be worse whenever she tries to eat something.  She has felt nauseous at times but has not vomited, denies any diarrhea or urinary symptoms.     Physical Exam   Triage Vital Signs: ED Triage Vitals  Encounter Vitals Group     BP 01/26/24 1353 134/87     Systolic BP Percentile --      Diastolic BP Percentile --      Pulse Rate 01/26/24 1353 99     Resp 01/26/24 1353 20     Temp 01/26/24 1353 98.9 F (37.2 C)     Temp Source 01/26/24 1353 Oral     SpO2 01/26/24 1353 97 %     Weight 01/26/24 1354 169 lb (76.7 kg)     Height 01/26/24 1354 5\' 5"  (1.651 m)     Head Circumference --      Peak Flow --      Pain Score 01/26/24 1354 3     Pain Loc --      Pain Education --      Exclude from Growth Chart --     Most recent vital signs: Vitals:   01/26/24 1353 01/26/24 1924  BP: 134/87 129/70  Pulse: 99 79  Resp: 20 20  Temp: 98.9 F (37.2 C) 98.6 F (37 C)  SpO2: 97% 97%    Constitutional: Alert and oriented. Eyes: Conjunctivae are normal. Head: Atraumatic. Nose: No congestion/rhinnorhea. Mouth/Throat: Mucous membranes are moist.  Cardiovascular: Normal rate, regular rhythm. Grossly normal heart sounds.  2+ radial  pulses bilaterally. Respiratory: Normal respiratory effort.  No retractions. Lungs CTAB. Gastrointestinal: Soft and nontender. No distention. Musculoskeletal: No lower extremity tenderness nor edema.  Neurologic:  Normal speech and language. No gross focal neurologic deficits are appreciated.    ED Results / Procedures / Treatments   Labs (all labs ordered are listed, but only abnormal results are displayed) Labs Reviewed  BASIC METABOLIC PANEL - Abnormal; Notable for the following components:      Result Value   Glucose, Bld 113 (*)    Creatinine, Ser 1.07 (*)    GFR, Estimated 56 (*)    All other components within normal limits  CBC - Abnormal; Notable for the following components:   WBC 10.7 (*)    All other components within normal limits  TROPONIN I (HIGH SENSITIVITY)  TROPONIN I (HIGH SENSITIVITY)     EKG  ED ECG REPORT I, Chesley Noon, the attending physician, personally viewed and interpreted this ECG.   Date: 01/26/2024  EKG Time: 13:54  Rate: 91  Rhythm: normal sinus rhythm  Axis: Normal  Intervals:none  ST&T Change: None  RADIOLOGY Chest x-ray reviewed and interpreted  by me with no infiltrate, edema, or effusion.  PROCEDURES:  Critical Care performed: No  Procedures   MEDICATIONS ORDERED IN ED: Medications  alum & mag hydroxide-simeth (MAALOX/MYLANTA) 200-200-20 MG/5ML suspension 30 mL (30 mLs Oral Given 01/26/24 1900)    And  lidocaine (XYLOCAINE) 2 % viscous mouth solution 15 mL (15 mLs Oral Given 01/26/24 1900)  famotidine (PEPCID) tablet 20 mg (20 mg Oral Given 01/26/24 1900)     IMPRESSION / MDM / ASSESSMENT AND PLAN / ED COURSE  I reviewed the triage vital signs and the nursing notes.                              73 y.o. female with past medical history of hypertension, hyperlipidemia, asthma, and anemia who presents to the ED complaining of 2 days of burning discomfort in her chest moving up into her throat that is worse when she  eats.  Patient's presentation is most consistent with acute presentation with potential threat to life or bodily function.  Differential diagnosis includes, but is not limited to, ACS, PE, pneumonia, pneumothorax, musculoskeletal pain, GERD, anxiety.  Patient nontoxic-appearing and in no acute distress, vital signs are unremarkable.  EKG shows no evidence of arrhythmia or ischemia and 2 sets of troponin are within normal limits.  Chest x-ray is unremarkable, remainder of labs are reassuring with no significant anemia, leukocytosis, electrolyte abnormality, or AKI.  Symptoms seem consistent with gastritis/GERD related to recent steroid use.  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  We will treat with GI cocktail and Pepcid, plan to reassess and discuss with pulmonary if she does not have significant relief.  Patient with relief following GI cocktail and dose of Pepcid, would not decrease her steroid dosing at this time as this could interfere with her treatment for BOOP.  She was counseled to follow-up with pulmonary and to return to the ED for new or worsening symptoms, we will prescribe viscous lidocaine for use as needed, patient also counseled she may take Pepcid and Tums.  Patient agrees with plan.      FINAL CLINICAL IMPRESSION(S) / ED DIAGNOSES   Final diagnoses:  Atypical chest pain  Acute gastritis without hemorrhage, unspecified gastritis type     Rx / DC Orders   ED Discharge Orders          Ordered    lidocaine (XYLOCAINE) 2 % solution  As needed        01/26/24 2006             Note:  This document was prepared using Dragon voice recognition software and may include unintentional dictation errors.   Chesley Noon, MD 01/26/24 2008

## 2024-02-01 ENCOUNTER — Encounter: Payer: Self-pay | Admitting: Urology

## 2024-02-01 ENCOUNTER — Ambulatory Visit: Payer: Medicare Other | Admitting: Urology

## 2024-02-01 DIAGNOSIS — Z87440 Personal history of urinary (tract) infections: Secondary | ICD-10-CM | POA: Diagnosis not present

## 2024-02-01 DIAGNOSIS — Z09 Encounter for follow-up examination after completed treatment for conditions other than malignant neoplasm: Secondary | ICD-10-CM | POA: Diagnosis not present

## 2024-02-01 DIAGNOSIS — N390 Urinary tract infection, site not specified: Secondary | ICD-10-CM

## 2024-02-01 MED ORDER — ESTRADIOL 0.1 MG/GM VA CREA: TOPICAL_CREAM | VAGINAL | 12 refills | Status: DC

## 2024-02-01 NOTE — Patient Instructions (Signed)
 Continue topical estrogen cream 3 times weekly, would also encourage cranberry tablets 1-2 times daily to help prevent infections

## 2024-02-01 NOTE — Progress Notes (Signed)
   02/01/2024 12:58 PM   Jody Brewer 1951/06/04 [id number]  Reason for visit: Follow up recurrent UTI  HPI: 73 year old female who I originally saw in September 2024 for recurrent culture documented UTIs.  She has undergone extensive workup including a negative CT scan in July 2024 as well as a negative cystoscopy in November 2024.  She has had some improvement over the last few months on topical estrogen cream and cranberry tablet prophylaxis.  She did have recurrent culture documented UTIs in October and December 2024.  She had a recent hospitalization for possible pneumonia, I personally viewed and interpreted the CT scan from 01/04/2024 showing no urologic abnormalities.  Multiple urine samples and cultures were negative over the last 6 weeks.  She denies any urinary symptoms today.  Encouraged to continue topical estrogen cream and cranberry tablet prophylaxis.  Could consider Hip-Rex in the future if 2+ UTIs per year despite above strategies  RTC 6 months symptom check, if doing well can follow-up yearly   Jody JAYSON Burnet, MD  [facility] [address] [place], [place] [address] [phone]

## 2024-03-27 ENCOUNTER — Other Ambulatory Visit: Payer: Self-pay | Admitting: Emergency Medicine

## 2024-03-27 DIAGNOSIS — R052 Subacute cough: Secondary | ICD-10-CM

## 2024-03-27 DIAGNOSIS — R5381 Other malaise: Secondary | ICD-10-CM

## 2024-03-27 DIAGNOSIS — R509 Fever, unspecified: Secondary | ICD-10-CM

## 2024-03-27 DIAGNOSIS — J4481 Bronchiolitis obliterans and bronchiolitis obliterans syndrome: Secondary | ICD-10-CM

## 2024-03-27 DIAGNOSIS — N390 Urinary tract infection, site not specified: Secondary | ICD-10-CM

## 2024-04-02 ENCOUNTER — Ambulatory Visit
Admission: RE | Admit: 2024-04-02 | Discharge: 2024-04-02 | Disposition: A | Discharge status: Home / Self Care | Source: Ambulatory Visit | Attending: Emergency Medicine | Admitting: Emergency Medicine

## 2024-04-02 DIAGNOSIS — R509 Fever, unspecified: Secondary | ICD-10-CM

## 2024-04-02 DIAGNOSIS — J4481 Bronchiolitis obliterans and bronchiolitis obliterans syndrome: Secondary | ICD-10-CM

## 2024-04-02 DIAGNOSIS — R052 Subacute cough: Secondary | ICD-10-CM | POA: Diagnosis present

## 2024-04-02 DIAGNOSIS — R5381 Other malaise: Secondary | ICD-10-CM

## 2024-04-02 DIAGNOSIS — N390 Urinary tract infection, site not specified: Secondary | ICD-10-CM

## 2024-04-04 NOTE — Telephone Encounter (Signed)
 Patient called back to schedule appointment for Friday with Sam. She said her Pulmonologist is going to call in a 2 day prescription for Augmentin for her to take until we can see her. She said she can't stay out of the bathroom, and needs some relief. Just want to make sure that it's ok to see her if she takes Augmentin today and tomorrow. Please advise.

## 2024-04-04 NOTE — Telephone Encounter (Signed)
 Called patient to let her know it is ok to take Augmentin, and we will see her in office on Friday 04/06/24.

## 2024-04-06 ENCOUNTER — Encounter: Payer: Self-pay | Admitting: Physician Assistant

## 2024-04-06 ENCOUNTER — Ambulatory Visit: Admitting: Physician Assistant

## 2024-04-06 VITALS — BP 143/79 | HR 98 | Ht 65.0 in | Wt 186.6 lb

## 2024-04-06 DIAGNOSIS — N390 Urinary tract infection, site not specified: Secondary | ICD-10-CM | POA: Diagnosis not present

## 2024-04-06 LAB — BLADDER SCAN AMB NON-IMAGING: PVR: 19 WU

## 2024-04-06 LAB — MICROSCOPIC EXAMINATION: WBC, UA: >30 /HPF — AB (ref 0–5)

## 2024-04-06 LAB — URINALYSIS, COMPLETE
Bilirubin, UA: NEGATIVE
Ketones, UA: NEGATIVE
Nitrite, UA: POSITIVE — AB
Specific Gravity, UA: 1.015 (ref 1.005–1.030)
Urobilinogen, Ur: 1 mg/dL (ref 0.2–1.0)
pH, UA: 6.5 (ref 5.0–7.5)

## 2024-04-06 MED ORDER — LEVOFLOXACIN 500 MG PO TABS: 500.0000 mg | ORAL_TABLET | Freq: Every day | ORAL | 0 refills | Status: DC

## 2024-04-06 MED ORDER — FLUCONAZOLE 150 MG PO TABS: 150.0000 mg | ORAL_TABLET | Freq: Once | ORAL | 0 refills | Status: AC

## 2024-04-06 MED ORDER — CEFTRIAXONE SODIUM 1 G IJ SOLR
1.0000 g | Freq: Once | INTRAMUSCULAR | Status: AC
  Administered 2024-04-06: 1 g via INTRAMUSCULAR

## 2024-04-06 NOTE — Progress Notes (Signed)
 04/06/2024 11:07 AM   Jody Brewer 08/08/1951 [id number]  CC: Chief Complaint  Patient presents with   Urinary Frequency   HPI: Jody Brewer is a 73 y.o. female with PMH recurrent UTI who presents today for evaluation of recurrent versus persistent UTI.   Today she reports onset of dysuria and suprapubic pain about 13 days ago.  She was prescribed 6-7 days of Augmentin at that time by pulmonology for her lungs and her voiding symptoms resolved with this, however within a day of completing the course they returned and were more severe than prior.  Today she reports intense dysuria and bladder spasms.  Dr. Luella Cook off started her on Bactrim and Pyridium and she is completed 2 doses of the antibiotics as of this morning.  She has not noticed any significant improvement.  She is having nocturia hourly.  She reports elevated body temp, Tmax 100 F, earlier this week that has since resolved.  She is also having some nausea but denies flank pain or vomiting.  No stone seen on prior cross-sectional imaging.  Notably, was admitted back in January with pneumonia and has been on prednisone, which she completed about 2 weeks ago.  In-office UA today positive for trace glucose, trace intact blood, trace protein, nitrites, and 3+ leukocytes; urine microscopy with >30 WBCs/HPF and moderate bacteria.  PVR 19 mL.  PMH: Past Medical History:  Diagnosis Date   AKI (acute kidney injury) (HCC) 01/15/2020   Asthma    COVID-19 11/2019   hospitalized x2   Dizziness 01/02/2024   Fever 01/03/2024   GERD (gastroesophageal reflux disease)    Headache    Hypertension    Hypotension 01/02/2024   Sleep apnea     Surgical History: Past Surgical History:  Procedure Laterality Date   ABDOMINAL HYSTERECTOMY     APPENDECTOMY     CATARACT EXTRACTION W/PHACO Left 06/10/2021   Procedure: CATARACT EXTRACTION PHACO AND INTRAOCULAR LENS PLACEMENT (IOC) LEFT;  Surgeon: Lockie Mola, MD;   Location: [facility];  Service: Ophthalmology;  Laterality: Left;  5.69 0:53.6 10.6%   CATARACT EXTRACTION W/PHACO Right 06/24/2021   Procedure: CATARACT EXTRACTION PHACO AND INTRAOCULAR LENS PLACEMENT (IOC) RIGHT;  Surgeon: Lockie Mola, MD;  Location: [facility];  Service: Ophthalmology;  Laterality: Right;  3.61 00:52.4   CHOLECYSTECTOMY     ELBOW BURSA SURGERY Right    FRENULECTOMY, LINGUAL     and removal of portion of soft palate   LUMBAR DISC SURGERY  1996   L3-L5   LUMBAR DISC SURGERY  2004   LUMBAR FUSION  2006   bone harvest rods (2) and screws (36)   LUMBAR LAMINECTOMY  1998   L3-L5   MAXILLARY ANTROSTOMY Right 08/12/2022   Procedure: MAXILLARY ANTROSTOMY REVISION;  Surgeon: Vernie Murders, MD;  Location: [facility];  Service: ENT;  Laterality: Right;  placed disk on OR charge nurse desk 8-8 kp   OOPHORECTOMY     1 ovary removed   TOTAL KNEE ARTHROPLASTY Right 11/17/2021   Procedure: TOTAL KNEE ARTHROPLASTY;  Surgeon: [name], MD;  Location: [facility];  Service: Orthopedics;  Laterality: Right;   UVULOPALATOPHARYNGOPLASTY  2001   XI ROBOTIC LAPAROSCOPIC ASSISTED APPENDECTOMY N/A 05/31/2023   Procedure: XI ROBOTIC LAPAROSCOPIC ASSISTED APPENDECTOMY;  Surgeon: Henrene Dodge, MD;  Location: [facility];  Service: General;  Laterality: N/A;    Home Medications:  Allergies as of 04/06/2024       Reactions   Nirmatrelvir-ritonavir Nausea Only   "  Severe nausea and vomiting."   Atorvastatin Palpitations, Other (See Comments)   Muscle pain and severe twitching   Meloxicam Rash   Tolerates ibuprofen        Medication List        Accurate as of April 06, 2024 11:07 AM. If you have any questions, ask your nurse or doctor.          STOP taking these medications    sulfamethoxazole-trimethoprim 800-160 MG tablet Commonly known as: BACTRIM DS Stopped by: Carman Ching       TAKE these medications    albuterol 108 (90  Base) MCG/ACT inhaler Commonly known as: VENTOLIN HFA Inhale 2 puffs into the lungs every 4 (four) hours as needed for wheezing or shortness of breath.   ALPRAZolam 0.25 MG tablet Commonly known as: XANAX Take 1 tablet (0.25 mg total) by mouth 3 (three) times daily as needed for anxiety or sleep.   buPROPion 150 MG 24 hr tablet Commonly known as: WELLBUTRIN XL Take 150 mg by mouth daily.   Dialyvite Vitamin D 5000 125 MCG (5000 UT) capsule Generic drug: Cholecalciferol Take 5,000 Units by mouth daily.   estradiol 0.1 MG/GM vaginal cream Commonly known as: ESTRACE Estrogen Cream Instruction Discard applicator Apply pea sized amount to tip of finger to urethra before bed. Wash hands well after application. Use Monday, Wednesday and Friday   estradiol 0.5 MG tablet Commonly known as: ESTRACE Take 0.5 mg by mouth daily.   ferrous sulfate 325 (65 FE) MG EC tablet Take 325 mg by mouth. Mondays/Wednesday/friday   gabapentin 300 MG capsule Commonly known as: NEURONTIN Take 300 mg by mouth at bedtime.   ibuprofen 800 MG tablet Commonly known as: ADVIL Take 1 tablet by mouth 2 (two) times daily as needed.   levofloxacin 500 MG tablet Commonly known as: LEVAQUIN Take 1 tablet (500 mg total) by mouth daily. Started by: Carman Ching   lidocaine 2 % solution Commonly known as: XYLOCAINE Use as directed 15 mLs in the mouth or throat as needed for mouth pain.   montelukast 10 MG tablet Commonly known as: SINGULAIR Take 10 mg by mouth daily.   Multi-Vitamin tablet Take 1 tablet by mouth daily.   nortriptyline 10 MG capsule Commonly known as: PAMELOR Take 20 mg by mouth at bedtime.   pantoprazole 40 MG tablet Commonly known as: PROTONIX Take 40 mg by mouth 2 (two) times daily.   phenazopyridine 100 MG tablet Commonly known as: PYRIDIUM Take by mouth.   venlafaxine XR 150 MG 24 hr capsule Commonly known as: EFFEXOR-XR Take 150 mg by mouth daily with breakfast.    vitamin E 180 MG (400 UNITS) capsule Take 400 Units by mouth 2 (two) times daily.        Allergies:  Allergies  Allergen Reactions   Nirmatrelvir-Ritonavir Nausea Only    " Severe nausea and vomiting."   Atorvastatin Palpitations and Other (See Comments)    Muscle pain and severe twitching   Meloxicam Rash    Tolerates ibuprofen    Family History: Family History  Problem Relation Age of Onset   Emphysema Mother    Parkinson's disease Father    Dementia Father    Hypertension Father    Breast cancer Neg Hx     Social History:   reports that she has never smoked. She has never been exposed to tobacco smoke. She has never used smokeless tobacco. She reports that she does not currently use alcohol after a past  usage of about 3.0 standard drinks of alcohol per week. She reports that she does not currently use drugs.  Physical Exam: BP (!) 143/79   Pulse 98   Ht 5\' 5"  (1.651 m)   Wt 186 lb 9.6 oz (84.6 kg)   BMI 31.05 kg/m   Constitutional:  Alert and oriented, no acute distress, nontoxic appearing HEENT: [place], AT Cardiovascular: No clubbing, cyanosis, or edema Respiratory: Normal respiratory effort, no increased work of breathing Skin: No rashes, bruises or suspicious lesions Neurologic: Grossly intact, no focal deficits, moving all 4 extremities Psychiatric: Normal mood and affect  Laboratory Data: Results for orders placed or performed in visit on 04/06/24  Microscopic Examination   Collection Time: 04/06/24 10:29 AM   Urine  Result Value Ref Range   WBC, UA >30 (A) 0 - 5 /hpf   RBC, Urine 0-2 0 - 2 /hpf   Epithelial Cells (non renal) 0-10 0 - 10 /hpf   Mucus, UA Present (A) Not Estab.   Bacteria, UA Moderate (A) None seen/Few  Urinalysis, Complete   Collection Time: 04/06/24 10:29 AM  Result Value Ref Range   Specific Gravity, UA 1.015 1.005 - 1.030   pH, UA 6.5 5.0 - 7.5   Color, UA Orange Yellow   Appearance Ur Cloudy (A) Clear   Leukocytes,UA 3+ (A)  Negative   Protein,UA Trace (A) Negative/Trace   Glucose, UA Trace (A) Negative   Ketones, UA Negative Negative   RBC, UA Trace (A) Negative   Bilirubin, UA Negative Negative   Urobilinogen, Ur 1.0 0.2 - 1.0 mg/dL   Nitrite, UA Positive (A) Negative   Microscopic Examination See below:   Bladder Scan (Post Void Residual) in office   Collection Time: 04/06/24 10:43 AM  Result Value Ref Range   PVR 19.0 WU   Assessment & Plan:   1. Recurrent UTI (Primary) Recurrent versus persistent UTI in the setting of steroid taper.  She has had low-grade body temps and her UA appears grossly infected today despite 2 doses of Bactrim.  Will treat aggressively with IM Rocephin and 10 days of p.o. Levaquin.  Also sending in a dose of Diflucan in case she develops a yeast infection.  She is in agreement with this plan.  We discussed return precautions including fever or flank pain. - Urinalysis, Complete - Bladder Scan (Post Void Residual) in office - CULTURE, URINE COMPREHENSIVE - cefTRIAXone (ROCEPHIN) injection 1 g - levofloxacin (LEVAQUIN) 500 MG tablet; Take 1 tablet (500 mg total) by mouth daily.  Dispense: 10 tablet; Refill: 0 - fluconazole (DIFLUCAN) 150 MG tablet; Take 1 tablet (150 mg total) by mouth once for 1 dose.  Dispense: 1 tablet; Refill: 0   Return if symptoms worsen or fail to improve.  Carman Ching, PA-C  [facility] [place] [address] [place], [place] [address] [phone]

## 2024-04-10 ENCOUNTER — Other Ambulatory Visit: Payer: Self-pay

## 2024-04-10 DIAGNOSIS — N390 Urinary tract infection, site not specified: Secondary | ICD-10-CM

## 2024-04-10 LAB — CULTURE, URINE COMPREHENSIVE

## 2024-04-10 MED ORDER — AMOXICILLIN-POT CLAVULANATE 875-125 MG PO TABS
1.0000 | ORAL_TABLET | Freq: Two times a day (BID) | ORAL | 0 refills | Status: DC
Start: 2024-04-10 — End: 2024-05-29

## 2024-04-17 ENCOUNTER — Other Ambulatory Visit: Payer: Self-pay | Admitting: Internal Medicine

## 2024-04-17 DIAGNOSIS — Z1231 Encounter for screening mammogram for malignant neoplasm of breast: Secondary | ICD-10-CM

## 2024-04-27 ENCOUNTER — Encounter

## 2024-05-08 ENCOUNTER — Ambulatory Visit
Admission: RE | Admit: 2024-05-08 | Discharge: 2024-05-08 | Disposition: A | Discharge status: Home / Self Care | Source: Ambulatory Visit | Attending: Internal Medicine | Admitting: Internal Medicine

## 2024-05-08 DIAGNOSIS — Z1231 Encounter for screening mammogram for malignant neoplasm of breast: Secondary | ICD-10-CM | POA: Diagnosis present

## 2024-05-15 ENCOUNTER — Other Ambulatory Visit: Payer: Self-pay | Admitting: Internal Medicine

## 2024-05-15 DIAGNOSIS — R928 Other abnormal and inconclusive findings on diagnostic imaging of breast: Secondary | ICD-10-CM

## 2024-05-16 ENCOUNTER — Ambulatory Visit
Admission: RE | Admit: 2024-05-16 | Discharge: 2024-05-16 | Disposition: A | Discharge status: Home / Self Care | Source: Ambulatory Visit | Attending: Internal Medicine | Admitting: Internal Medicine

## 2024-05-16 DIAGNOSIS — R928 Other abnormal and inconclusive findings on diagnostic imaging of breast: Secondary | ICD-10-CM | POA: Diagnosis present

## 2024-05-28 NOTE — Progress Notes (Signed)
 05/29/2024 10:34 AM   Jody Brewer February 09, 1951 [id number]  Referring provider: Sari Cunning, MD [phone] [facility] [address] [facility] [place],  [place] [address]  Urological history: 1. rUTI's -contrast CT (06/2023) - no nidus for infection -cysto (10/2023) - NED  -contrast CT (12/2023) - no nidus for infection -April 06, 2024, E. Coli -March 26, 2024, E. coli -January 07, 2024, no growth -January 02, 2024 no growth -December 14, 2023, E. coli -October 21, 2023 - Klebsiella pneumoniae -August 09, 2023 - E.coli -July 31,2024 - No growth -July 19, 2023 - E.coli -July 15, 2023 - E.coli -June 28, 2023 - <10,000 colonies -June 13, 2023 - E.coli -vaginal estrogen cream applied three nights weekly   Chief Complaint  Patient presents with   Urinary Tract Infection   HPI: Jody Brewer is a 73 y.o. woman who presents today for UTI symptoms.  Previous records reviewed.   She states ever since she had a COVID infection she has a myriad of debilitating symptoms.  She also states that when she noticed she was having issues with recurrent UTIs.  She is taking a low-dose oral estrogen as well as applying the vaginal estrogen cream.  She states her recent infection started Friday where she was experiencing mild to moderate pain in the suprapubic region, frequency, burning and when she urinates she has chills all over her body and she also states that the urine is burning her externally.   Patient denies any modifying or aggravating factors.  Patient denies any recent UTI's, gross hematuria or flank pain.  Patient denies any fevers, chills, nausea or vomiting.    She has been working with her PCP to find answers for her other symptoms such as fatigue, depression, joint pain and swelling.  UA yellow cloudy, specific gravity 1.025, pH 6.0, trace protein, 1+ heme, 2+ leukocyte, greater than 30 WBCs, 3-10 RBCs, 0-10 epithelial cells and moderate  bacteria.  PMH: Past Medical History:  Diagnosis Date   AKI (acute kidney injury) (HCC) 01/15/2020   Asthma    COVID-19 11/2019   hospitalized x2   Dizziness 01/02/2024   Fever 01/03/2024   GERD (gastroesophageal reflux disease)    Headache    Hypertension    Hypotension 01/02/2024   Sleep apnea     Surgical History: Past Surgical History:  Procedure Laterality Date   ABDOMINAL HYSTERECTOMY     APPENDECTOMY     CATARACT EXTRACTION W/PHACO Left 06/10/2021   Procedure: CATARACT EXTRACTION PHACO AND INTRAOCULAR LENS PLACEMENT (IOC) LEFT;  Surgeon: Annell Kidney, MD;  Location: [facility];  Service: Ophthalmology;  Laterality: Left;  5.69 0:53.6 10.6%   CATARACT EXTRACTION W/PHACO Right 06/24/2021   Procedure: CATARACT EXTRACTION PHACO AND INTRAOCULAR LENS PLACEMENT (IOC) RIGHT;  Surgeon: Annell Kidney, MD;  Location: [facility];  Service: Ophthalmology;  Laterality: Right;  3.61 00:52.4   CHOLECYSTECTOMY     ELBOW BURSA SURGERY Right    FRENULECTOMY, LINGUAL     and removal of portion of soft palate   LUMBAR DISC SURGERY  1996   L3-L5   LUMBAR DISC SURGERY  2004   LUMBAR FUSION  2006   bone harvest rods (2) and screws (36)   LUMBAR LAMINECTOMY  1998   L3-L5   MAXILLARY ANTROSTOMY Right 08/12/2022   Procedure: MAXILLARY ANTROSTOMY REVISION;  Surgeon: Mellody Sprout, MD;  Location: [facility];  Service: ENT;  Laterality: Right;  placed disk on OR charge nurse desk 8-8  kp   OOPHORECTOMY     1 ovary removed   TOTAL KNEE ARTHROPLASTY Right 11/17/2021   Procedure: TOTAL KNEE ARTHROPLASTY;  Surgeon: Elner Hahn, MD;  Location: [facility];  Service: Orthopedics;  Laterality: Right;   UVULOPALATOPHARYNGOPLASTY  2001   XI ROBOTIC LAPAROSCOPIC ASSISTED APPENDECTOMY N/A 05/31/2023   Procedure: XI ROBOTIC LAPAROSCOPIC ASSISTED APPENDECTOMY;  Surgeon: Emmalene Hare, MD;  Location: [facility];  Service: General;  Laterality: N/A;    Home  Medications:  Allergies as of 05/29/2024       Reactions   Nirmatrelvir-ritonavir Nausea Only   " Severe nausea and vomiting."   Atorvastatin  Palpitations, Other (See Comments)   Muscle pain and severe twitching   Meloxicam Rash   Tolerates ibuprofen         Medication List        Accurate as of May 29, 2024 10:34 AM. If you have any questions, ask your nurse or doctor.          STOP taking these medications    amoxicillin -clavulanate 875-125 MG tablet Commonly known as: AUGMENTIN  Stopped by: Matilde Son   buPROPion  150 MG 24 hr tablet Commonly known as: WELLBUTRIN  XL Stopped by: Amiee Wiley   Dialyvite Vitamin D  5000 125 MCG (5000 UT) capsule Generic drug: Cholecalciferol  Stopped by: Cathleen Coach Peyton Spengler   ferrous sulfate 325 (65 FE) MG EC tablet Stopped by: Iracema Lanagan   gabapentin  300 MG capsule Commonly known as: NEURONTIN  Stopped by: Vanessa Kampf   ibuprofen  800 MG tablet Commonly known as: ADVIL  Stopped by: Reeta Kuk   levofloxacin  500 MG tablet Commonly known as: LEVAQUIN  Stopped by: Matilde Son   lidocaine  2 % solution Commonly known as: XYLOCAINE  Stopped by: Hunner Garcon   venlafaxine  XR 150 MG 24 hr capsule Commonly known as: EFFEXOR -XR Stopped by: Jeniffer Culliver   vitamin E  180 MG (400 UNITS) capsule Stopped by: Cathleen Coach Mylee Falin       TAKE these medications    albuterol  108 (90 Base) MCG/ACT inhaler Commonly known as: VENTOLIN  HFA Inhale 2 puffs into the lungs every 4 (four) hours as needed for wheezing or shortness of breath.   ALPRAZolam  0.25 MG tablet Commonly known as: XANAX  Take 1 tablet (0.25 mg total) by mouth 3 (three) times daily as needed for anxiety or sleep.   cefdinir  300 MG capsule Commonly known as: OMNICEF  Take 1 capsule (300 mg total) by mouth 2 (two) times daily for 7 days. Started by: Matilde Son   DULoxetine 30 MG capsule Commonly known as: CYMBALTA Take 1 tablet by mouth  daily.   estradiol  0.1 MG/GM vaginal cream Commonly known as: ESTRACE  Estrogen Cream Instruction Discard applicator Apply pea sized amount to tip of finger to urethra before bed. Wash hands well after application. Use Monday, Wednesday and Friday   estradiol  0.5 MG tablet Commonly known as: ESTRACE  Take 0.5 mg by mouth daily.   losartan 50 MG tablet Commonly known as: COZAAR Take 50 mg by mouth daily.   montelukast  10 MG tablet Commonly known as: SINGULAIR  Take 10 mg by mouth daily.   Multi-Vitamin tablet Take 1 tablet by mouth daily.   nortriptyline  10 MG capsule Commonly known as: PAMELOR  Take 20 mg by mouth at bedtime.   pantoprazole  40 MG tablet Commonly known as: PROTONIX  Take 40 mg by mouth 2 (two) times daily.        Allergies:  Allergies  Allergen Reactions   Nirmatrelvir-Ritonavir Nausea Only    " Severe nausea and vomiting."  Atorvastatin  Palpitations and Other (See Comments)    Muscle pain and severe twitching   Meloxicam Rash    Tolerates ibuprofen     Family History: Family History  Problem Relation Age of Onset   Emphysema Mother    Parkinson's disease Father    Dementia Father    Hypertension Father    Breast cancer Neg Hx     Social History:  reports that she has never smoked. She has never been exposed to tobacco smoke. She has never used smokeless tobacco. She reports that she does not currently use alcohol after a past usage of about 3.0 standard drinks of alcohol per week. She reports that she does not currently use drugs.  ROS: Pertinent ROS in HPI  Physical Exam: BP 132/79   Pulse 87   Ht 5\' 5"  (1.651 m)   Wt 178 lb (80.7 kg)   BMI 29.62 kg/m   Constitutional:  Well nourished. Alert and oriented, No acute distress. HEENT: [place] AT, moist mucus membranes.  Trachea midline Cardiovascular: No clubbing, cyanosis, or edema. Respiratory: Normal respiratory effort, no increased work of breathing. Neurologic: Grossly intact, no focal  deficits, moving all 4 extremities. Psychiatric: Normal mood and affect.    Laboratory Data: CBC w/auto Differential (5 Part) Order: [id number] Component Ref Range & Units 2 mo ago  WBC (White Blood Cell Count) 4.1 - 10.2 10^3/uL 5.8  RBC (Red Blood Cell Count) 4.04 - 5.48 10^6/uL 4.12  Hemoglobin 12.0 - 15.0 gm/dL 78.2  Hematocrit 95.6 - 47.0 % 39.7  MCV (Mean Corpuscular Volume) 80.0 - 100.0 fl 96.4  MCH (Mean Corpuscular Hemoglobin) 27.0 - 31.2 pg 31.8 High   MCHC (Mean Corpuscular Hemoglobin Concentration) 32.0 - 36.0 gm/dL 33  Platelet Count 213 - 450 10^3/uL 321  RDW-CV (Red Cell Distribution Width) 11.6 - 14.8 % 13.2  MPV (Mean Platelet Volume) 9.4 - 12.4 fl 8.6 Low   Neutrophils 1.50 - 7.80 10^3/uL 2.33  Lymphocytes 1.00 - 3.60 10^3/uL 2.7  Monocytes 0.00 - 1.50 10^3/uL 0.63  Eosinophils 0.00 - 0.55 10^3/uL 0.04  Basophils 0.00 - 0.09 10^3/uL 0.05  Neutrophil % 32.0 - 70.0 % 40.4  Lymphocyte % 10.0 - 50.0 % 46.8  Monocyte % 4.0 - 13.0 % 10.9  Eosinophil % 1.0 - 5.0 % 0.7 Low   Basophil% 0.0 - 2.0 % 0.9  Immature Granulocyte % <=0.7 % 0.3  Immature Granulocyte Count <=0.06 10^3/L 0.02  Resulting Agency [facility]   Specimen Collected: 03/01/24 11:56   Performed by: [facility] Last Resulted: 03/01/24 13:46  Received From: [facility]  Result Received: 03/27/24 12:06   Comprehensive Metabolic Panel (CMP) Order: [id number] Component Ref Range & Units 2 mo ago  Glucose 70 - 110 mg/dL 80  Sodium 629 - 528 mmol/L 140  Potassium 3.6 - 5.1 mmol/L 4.4  Chloride 97 - 109 mmol/L 107  Carbon Dioxide (CO2) 22.0 - 32.0 mmol/L 29.6  Urea Nitrogen (BUN) 7 - 25 mg/dL 9  Creatinine 0.6 - 1.1 mg/dL 0.9  Glomerular Filtration Rate (eGFR) >60 mL/min/1.73sq m 68  Comment: CKD-EPI (2021) does not include patient's race in the calculation of eGFR.  Monitoring changes of plasma creatinine and eGFR over time  is useful for monitoring kidney function.  Interpretive Ranges for eGFR (CKD-EPI 2021):  eGFR:       >60 mL/min/1.73 sq. m - Normal eGFR:       30-59 mL/min/1.73 sq. m - Moderately Decreased  eGFR:       15-29 mL/min/1.73 sq. m  - Severely Decreased eGFR:       < 15 mL/min/1.73 sq. m  - Kidney Failure   Note: These eGFR calculations do not apply in acute situations when eGFR is changing rapidly or patients on dialysis.  Calcium  8.7 - 10.3 mg/dL 9  AST 8 - 39 U/L 22  ALT 5 - 38 U/L 21  Alk Phos (alkaline Phosphatase) 34 - 104 U/L 80  Albumin 3.5 - 4.8 g/dL 4.1  Bilirubin, Total 0.3 - 1.2 mg/dL 0.4  Protein, Total 6.1 - 7.9 g/dL 6.5  A/G Ratio 1.0 - 5.0 gm/dL 1.7  Resulting Agency [facility]   Specimen Collected: 03/01/24 11:56   Performed by: [facility] Last Resulted: 03/01/24 15:17  Received From: [facility]  Result Received: 03/27/24 12:06    Hemoglobin A1C Order: [id number] Component Ref Range & Units 2 mo ago  Hemoglobin A1C 4.2 - 5.6 % 5.7 High   Average Blood Glucose (Calc) mg/dL 829  Resulting Agency [facility]  Narrative Performed by [facility] Normal Range:    4.2 - 5.6% Increased Risk:  5.7 - 6.4% Diabetes:        >= 6.5% Glycemic Control for adults with diabetes:  <7%    Specimen Collected: 03/01/24 11:56   Performed by: [facility] Last Resulted: 03/01/24 16:16  Received From: [facility]  Result Received: 03/27/24 12:06    Urinalysis See EPIC and HPI  I have reviewed the labs.   Pertinent Imaging: N/A   Assessment & Plan:    1. rUTI's -UA grossly infected  -Urine culture pending -Started empirically on -Omnicef  300 mg twice daily for seven days, will adjust if necessary once urine culture and sensitivity results are available  - Today's visit mostly focused on her myriad of symptoms she has been experiencing since having  COVID, so we did not get to discuss specifically issues regarding her recurrent UTIs, but when she returns we may need to discuss adding Hiprex to help prevent further infections  2. GSM - Applying vaginal estrogen cream 3 nights weekly  3. Microscopic hematuria - UA with micro heme, but likely due to infection - She will follow-up in 2 months for repeat UA and symptom recheck  Return in about 2 months (around 07/29/2024) for repeat UA and symptom recheck .  These notes generated with voice recognition software. I apologize for typographical errors.  Briant Camper  [facility] [address]  [address] [place], [place] [address] [phone]

## 2024-05-29 ENCOUNTER — Ambulatory Visit: Admitting: Urology

## 2024-05-29 ENCOUNTER — Encounter: Payer: Self-pay | Admitting: Urology

## 2024-05-29 VITALS — BP 132/79 | HR 87 | Ht 65.0 in | Wt 178.0 lb

## 2024-05-29 DIAGNOSIS — N952 Postmenopausal atrophic vaginitis: Secondary | ICD-10-CM | POA: Diagnosis not present

## 2024-05-29 DIAGNOSIS — N390 Urinary tract infection, site not specified: Secondary | ICD-10-CM

## 2024-05-29 DIAGNOSIS — R3129 Other microscopic hematuria: Secondary | ICD-10-CM

## 2024-05-29 LAB — URINALYSIS, COMPLETE
Bilirubin, UA: NEGATIVE
Glucose, UA: NEGATIVE
Ketones, UA: NEGATIVE
Nitrite, UA: NEGATIVE
Specific Gravity, UA: 1.025 (ref 1.005–1.030)
Urobilinogen, Ur: 0.2 mg/dL (ref 0.2–1.0)
pH, UA: 6 (ref 5.0–7.5)

## 2024-05-29 LAB — MICROSCOPIC EXAMINATION: WBC, UA: >30 /HPF — AB (ref 0–5)

## 2024-05-29 MED ORDER — CEFDINIR 300 MG PO CAPS: 300.0000 mg | ORAL_CAPSULE | Freq: Two times a day (BID) | ORAL | 0 refills | Status: AC

## 2024-05-31 ENCOUNTER — Ambulatory Visit: Payer: Self-pay | Admitting: Urology

## 2024-05-31 LAB — CULTURE, URINE COMPREHENSIVE

## 2024-06-12 ENCOUNTER — Ambulatory Visit: Admitting: Urology

## 2024-06-12 VITALS — BP 158/85 | HR 76

## 2024-06-12 DIAGNOSIS — N390 Urinary tract infection, site not specified: Secondary | ICD-10-CM

## 2024-06-12 DIAGNOSIS — R3129 Other microscopic hematuria: Secondary | ICD-10-CM

## 2024-06-12 MED ORDER — LIDOCAINE HCL URETHRAL/MUCOSAL 2 % EX GEL
1.0000 | Freq: Once | CUTANEOUS | Status: AC
  Administered 2024-06-12: 1 via URETHRAL

## 2024-06-12 MED ORDER — METHENAMINE HIPPURATE 1 G PO TABS: 1.0000 g | ORAL_TABLET | Freq: Two times a day (BID) | ORAL | 3 refills | Status: DC

## 2024-06-12 NOTE — Progress Notes (Signed)
   06/12/2024 3:05 PM   Jody Brewer [birth year] [id number]  Reason for visit: Follow up recurrent UTI  HPI: 73 year old female who was set up for cystoscopy today by Matilde Son, PA, however she had a negative cystoscopy in November 2024 and opted to change today's visit to a clinic visit to discuss her recurrent UTIs.  I originally saw her in September 2024 for recurrent culture documented UTIs, this was felt to be related to GSM.  She had been doing relatively well when I saw her last in February 2025 on the topical estrogen cream and cranberry tablet prophylaxis and had not had any infections for 2 months.  Since that time she was diagnosed with pneumonia and was on a long steroid course, and had culture documented UTIs in April and June 2025.  She had a CT in January 2025 that was completely benign from a urology perspective.  We discussed the evaluation and treatment of patients with recurrent UTIs at length.  We specifically discussed the differences between asymptomatic bacteriuria and true urinary tract infection.  We discussed the AUA definition of recurrent UTI of at least 2 culture proven symptomatic acute cystitis episodes in a 38-month period, or 3 within a 1 year period.  We discussed the importance of culture directed antibiotic treatment, and antibiotic stewardship.  First-line therapy includes nitrofurantoin (5 days), Bactrim(3 days), or fosfomycin(3 g single dose).  Possible etiologies of recurrent infection include periurethral tissue atrophy in postmenopausal woman, constipation, sexual activity, incomplete emptying, anatomic abnormalities, and even genetic predisposition.  Finally, we discussed the role of perineal hygiene, timed voiding, adequate hydration, topical vaginal estrogen, cranberry prophylaxis, and low-dose antibiotic prophylaxis.  Continue topical estrogen cream, cranberry tablet prophylaxis Add Hiprex BID   Lawerence Pressman, MD  [facility]  Urology [address] [place], [place] [address] [phone]

## 2024-06-12 NOTE — Patient Instructions (Signed)
 I think you are issue is recurrent urinary infections.  Take cranberry tablets twice daily, topical estrogen cream 3 times weekly, Hip-Rex twice daily all for UTI prevention.

## 2024-07-31 ENCOUNTER — Ambulatory Visit: Payer: Medicare Other | Admitting: Urology

## 2024-08-02 ENCOUNTER — Encounter: Payer: Self-pay | Admitting: *Deleted

## 2024-08-02 ENCOUNTER — Ambulatory Visit: Admitting: Urology

## 2024-08-13 ENCOUNTER — Other Ambulatory Visit: Payer: Self-pay

## 2024-08-13 ENCOUNTER — Encounter
Admission: RE | Disposition: A | Discharge status: Home / Self Care | Payer: Self-pay | Source: Home / Self Care | Attending: Gastroenterology

## 2024-08-13 ENCOUNTER — Ambulatory Visit: Admitting: Anesthesiology

## 2024-08-13 ENCOUNTER — Ambulatory Visit
Admission: RE | Admit: 2024-08-13 | Discharge: 2024-08-13 | Disposition: A | Discharge status: Home / Self Care | Attending: Gastroenterology | Admitting: Gastroenterology

## 2024-08-13 DIAGNOSIS — K219 Gastro-esophageal reflux disease without esophagitis: Secondary | ICD-10-CM | POA: Diagnosis not present

## 2024-08-13 DIAGNOSIS — K449 Diaphragmatic hernia without obstruction or gangrene: Secondary | ICD-10-CM | POA: Insufficient documentation

## 2024-08-13 DIAGNOSIS — Z83719 Family history of colon polyps, unspecified: Secondary | ICD-10-CM | POA: Insufficient documentation

## 2024-08-13 DIAGNOSIS — K640 First degree hemorrhoids: Secondary | ICD-10-CM | POA: Diagnosis not present

## 2024-08-13 DIAGNOSIS — K2970 Gastritis, unspecified, without bleeding: Secondary | ICD-10-CM | POA: Insufficient documentation

## 2024-08-13 DIAGNOSIS — Z1211 Encounter for screening for malignant neoplasm of colon: Secondary | ICD-10-CM | POA: Insufficient documentation

## 2024-08-13 HISTORY — DX: Mixed hyperlipidemia: E78.2

## 2024-08-13 HISTORY — DX: Other specified interstitial pulmonary diseases: J84.89

## 2024-08-13 HISTORY — DX: Nontoxic multinodular goiter: E04.2

## 2024-08-13 HISTORY — DX: Depression, unspecified: F32.A

## 2024-08-13 HISTORY — PX: COLONOSCOPY: SHX5424

## 2024-08-13 HISTORY — DX: Other intervertebral disc degeneration, lumbosacral region without mention of lumbar back pain or lower extremity pain: M51.379

## 2024-08-13 HISTORY — DX: Gastro-esophageal reflux disease without esophagitis: K21.9

## 2024-08-13 HISTORY — DX: Major depressive disorder, recurrent, mild: F33.0

## 2024-08-13 HISTORY — PX: ESOPHAGOGASTRODUODENOSCOPY: SHX5428

## 2024-08-13 HISTORY — DX: Irritable bowel syndrome, unspecified: K58.9

## 2024-08-13 SURGERY — COLONOSCOPY
Anesthesia: General

## 2024-08-13 MED ORDER — EPHEDRINE SULFATE-NACL 50-0.9 MG/10ML-% IV SOSY
PREFILLED_SYRINGE | INTRAVENOUS | Status: DC | PRN
  Administered 2024-08-13: 10 mg via INTRAVENOUS
  Administered 2024-08-13: 15 mg via INTRAVENOUS

## 2024-08-13 MED ORDER — GLYCOPYRROLATE 0.2 MG/ML IJ SOLN
INTRAMUSCULAR | Status: AC
  Filled 2024-08-13: qty 1

## 2024-08-13 MED ORDER — PHENYLEPHRINE 80 MCG/ML (10ML) SYRINGE FOR IV PUSH (FOR BLOOD PRESSURE SUPPORT)
PREFILLED_SYRINGE | INTRAVENOUS | Status: DC | PRN
  Administered 2024-08-13 (×2): 160 ug via INTRAVENOUS

## 2024-08-13 MED ORDER — PROPOFOL 10 MG/ML IV BOLUS
INTRAVENOUS | Status: DC | PRN
Start: 2024-08-13 — End: 2024-08-13
  Administered 2024-08-13 (×2): 50 mg via INTRAVENOUS

## 2024-08-13 MED ORDER — LIDOCAINE HCL (CARDIAC) PF 100 MG/5ML IV SOSY
PREFILLED_SYRINGE | INTRAVENOUS | Status: DC | PRN
  Administered 2024-08-13: 60 mg via INTRAVENOUS

## 2024-08-13 MED ORDER — DEXMEDETOMIDINE HCL IN NACL 80 MCG/20ML IV SOLN
INTRAVENOUS | Status: DC | PRN
  Administered 2024-08-13: 8 ug via INTRAVENOUS
  Administered 2024-08-13: 12 ug via INTRAVENOUS

## 2024-08-13 MED ORDER — GLYCOPYRROLATE 0.2 MG/ML IJ SOLN
INTRAMUSCULAR | Status: DC | PRN
  Administered 2024-08-13: .2 mg via INTRAVENOUS

## 2024-08-13 MED ORDER — SODIUM CHLORIDE 0.9 % IV SOLN: INTRAVENOUS | Status: DC

## 2024-08-13 MED ORDER — PROPOFOL 1000 MG/100ML IV EMUL
INTRAVENOUS | Status: AC
  Filled 2024-08-13: qty 100

## 2024-08-13 MED ORDER — PROPOFOL 500 MG/50ML IV EMUL
INTRAVENOUS | Status: DC | PRN
  Administered 2024-08-13: 75 ug/kg/min via INTRAVENOUS

## 2024-08-13 NOTE — Interval H&P Note (Signed)
 History and Physical Interval Note:  08/13/2024 11:28 AM  Jody Brewer  has presented today for surgery, with the diagnosis of GERD,family hx of colon polyps.  The various methods of treatment have been discussed with the patient and family. After consideration of risks, benefits and other options for treatment, the patient has consented to  Procedure(s): COLONOSCOPY (N/A) EGD (ESOPHAGOGASTRODUODENOSCOPY) (N/A) as a surgical intervention.  The patient's history has been reviewed, patient examined, no change in status, stable for surgery.  I have reviewed the patient's chart and labs.  Questions were answered to the patient's satisfaction.     Ole ONEIDA Schick  Ok to proceed with EGD/Colonoscopy

## 2024-08-13 NOTE — Anesthesia Postprocedure Evaluation (Signed)
 Anesthesia Post Note  Patient: Jody Brewer  Procedure(s) Performed: COLONOSCOPY EGD (ESOPHAGOGASTRODUODENOSCOPY)  Patient location during evaluation: Endoscopy Anesthesia Type: General Level of consciousness: awake and alert Pain management: pain level controlled Vital Signs Assessment: post-procedure vital signs reviewed and stable Respiratory status: spontaneous breathing, nonlabored ventilation, respiratory function stable and patient connected to nasal cannula oxygen Cardiovascular status: blood pressure returned to baseline and stable Postop Assessment: no apparent nausea or vomiting Anesthetic complications: no   No notable events documented.   Last Vitals:  Vitals:   08/13/24 1211 08/13/24 1221  BP: (!) 93/37 105/73  Pulse: 80 87  Resp: 14 17  Temp: 36.6 C 36.6 C  SpO2: 100% 100%    Last Pain:  Vitals:   08/13/24 1221  TempSrc: Temporal  PainSc: 0-No pain                 Prentice Murphy

## 2024-08-13 NOTE — H&P (Signed)
 Outpatient short stay form Pre-procedure 08/13/2024  Jody ONEIDA Schick, MD  Primary Physician: Cleotilde Oneil FALCON, MD  Reason for visit:  Dyspepsia/Colon cancer screening  History of present illness:    73 y/o lady with history of hypertension, asthma, and OSA here for EGD for dyspepsia and colonoscopy for screening due to family history of polyps. Last colonoscopy in 2020 was unremarkable. No blood thinners. History of appendectomy, cholecystectomy, and hysterectomy.    Current Facility-Administered Medications:    0.9 %  sodium chloride  infusion, , Intravenous, Continuous, Winford Hehn, Jody ONEIDA, MD, Last Rate: 20 mL/hr at 08/13/24 1103, Continued from Pre-op at 08/13/24 1103  Medications Prior to Admission  Medication Sig Dispense Refill Last Dose/Taking   losartan (COZAAR) 50 MG tablet Take 50 mg by mouth daily.   08/12/2024 Morning   albuterol  (VENTOLIN  HFA) 108 (90 Base) MCG/ACT inhaler Inhale 2 puffs into the lungs every 4 (four) hours as needed for wheezing or shortness of breath.      ALPRAZolam  (XANAX ) 0.5 MG tablet Take 0.75 mg by mouth at bedtime.      celecoxib (CELEBREX) 200 MG capsule Take 200 mg by mouth daily.      DULoxetine (CYMBALTA) 30 MG capsule Take 1 tablet by mouth daily.      estradiol  (ESTRACE ) 0.1 MG/GM vaginal cream Estrogen Cream Instruction Discard applicator Apply pea sized amount to tip of finger to urethra before bed. Wash hands well after application. Use Monday, Wednesday and Friday 42.5 g 12    estradiol  (ESTRACE ) 0.5 MG tablet Take 0.5 mg by mouth daily.      methenamine  (HIPREX ) 1 g tablet Take 1 tablet (1 g total) by mouth 2 (two) times daily with a meal. 60 tablet 3    minoxidil (LONITEN) 2.5 MG tablet Take 2.5 mg by mouth daily.      montelukast  (SINGULAIR ) 10 MG tablet Take 10 mg by mouth daily.      Multiple Vitamin (MULTI-VITAMIN) tablet Take 1 tablet by mouth daily.      nortriptyline  (PAMELOR ) 10 MG capsule Take 20 mg by mouth at bedtime.       pantoprazole  (PROTONIX ) 40 MG tablet Take 40 mg by mouth 2 (two) times daily.      sucralfate (CARAFATE) 1 g tablet Take 1 g by mouth 4 (four) times daily -  with meals and at bedtime.      WIXELA INHUB 100-50 MCG/ACT AEPB Inhale 1 puff into the lungs 2 (two) times daily.        Allergies  Allergen Reactions   Nirmatrelvir-Ritonavir Nausea Only     Severe nausea and vomiting.   Atorvastatin  Palpitations and Other (See Comments)    Muscle pain and severe twitching   Meloxicam Rash    Tolerates ibuprofen      Past Medical History:  Diagnosis Date   AKI (acute kidney injury) (HCC) 01/15/2020   Asthma    BOOP (bronchiolitis obliterans with organizing pneumonia) (HCC)    COVID-19 11/2019   hospitalized x2   DDD (degenerative disc disease), lumbosacral    Depression    Dizziness 01/02/2024   Fever 01/03/2024   GERD (gastroesophageal reflux disease)    Headache    Hyperlipidemia, mixed    Hyperlipidemia, mixed    Hypertension    Hypotension 01/02/2024   IBS (irritable bowel syndrome)    LPRD (laryngopharyngeal reflux disease)    Major depressive disorder, recurrent, mild (HCC)    Multinodular goiter    Sleep apnea  Review of systems:  Otherwise negative.    Physical Exam  Gen: Alert, oriented. Appears stated age.  HEENT: PERRLA. Lungs: No respiratory distress CV: RRR Abd: soft, benign, no masses Ext: No edema    Planned procedures: Proceed with EGD/colonoscopy. The patient understands the nature of the planned procedure, indications, risks, alternatives and potential complications including but not limited to bleeding, infection, perforation, damage to internal organs and possible oversedation/side effects from anesthesia. The patient agrees and gives consent to proceed.  Please refer to procedure notes for findings, recommendations and patient disposition/instructions.     Jody ONEIDA Schick, MD [facility]

## 2024-08-13 NOTE — Op Note (Signed)
 [facility] Gastroenterology Patient Name: Jody Brewer Procedure Date: 08/13/2024 11:36 AM MRN: [id number] Account #: [id number] Date of Birth: 08-10-1951 Admit Type: Outpatient Age: 73 Room: [facility] ENDO ROOM 3 Gender: Female Note Status: Supervisor Override Instrument Name: Barnie GI Scope [id number] Procedure:             Upper GI endoscopy Indications:           Gastro-esophageal reflux disease Providers:             Ole Schick MD, MD Referring MD:          [name], MD (Referring MD) Medicines:             Monitored Anesthesia Care Complications:         No immediate complications. Estimated blood loss:                         Minimal. Procedure:             Pre-Anesthesia Assessment:                        - Prior to the procedure, a History and Physical was                         performed, and patient medications and allergies were                         reviewed. The patient is competent. The risks and                         benefits of the procedure and the sedation options and                         risks were discussed with the patient. All questions                         were answered and informed consent was obtained.                         Patient identification and proposed procedure were                         verified by the physician, the nurse, the                         anesthesiologist, the anesthetist and the technician                         in the endoscopy suite. Mental Status Examination:                         alert and oriented. Airway Examination: normal                         oropharyngeal airway and neck mobility. Respiratory                         Examination: clear to auscultation. CV Examination:  normal. Prophylactic Antibiotics: The patient does not                         require prophylactic antibiotics. Prior                         Anticoagulants: The patient has taken no  anticoagulant                         or antiplatelet agents. ASA Grade Assessment: III - A                         patient with severe systemic disease. After reviewing                         the risks and benefits, the patient was deemed in                         satisfactory condition to undergo the procedure. The                         anesthesia plan was to use monitored anesthesia care                         (MAC). Immediately prior to administration of                         medications, the patient was re-assessed for adequacy                         to receive sedatives. The heart rate, respiratory                         rate, oxygen saturations, blood pressure, adequacy of                         pulmonary ventilation, and response to care were                         monitored throughout the procedure. The physical                         status of the patient was re-assessed after the                         procedure.                        After obtaining informed consent, the endoscope was                         passed under direct vision. Throughout the procedure,                         the patient's blood pressure, pulse, and oxygen                         saturations were monitored continuously. The Endoscope  was introduced through the mouth, and advanced to the                         second part of duodenum. The upper GI endoscopy was                         accomplished without difficulty. The patient tolerated                         the procedure well. Findings:      A 3 cm hiatal hernia was present.      The exam of the esophagus was otherwise normal.      Patchy mild inflammation characterized by erythema was found in the       gastric antrum. Biopsies were taken with a cold forceps for Helicobacter       pylori testing. Estimated blood loss was minimal.      The examined duodenum was normal. Impression:            - 3 cm hiatal  hernia.                        - Gastritis. Biopsied.                        - Normal examined duodenum. Recommendation:        - Discharge patient to home.                        - Resume previous diet.                        - Continue present medications.                        - Await pathology results.                        - Return to referring physician as previously                         scheduled. Procedure Code(s):     --- Professional ---                        [phone], Esophagogastroduodenoscopy, flexible,                         transoral; with biopsy, single or multiple Diagnosis Code(s):     --- Professional ---                        K44.9, Diaphragmatic hernia without obstruction or                         gangrene                        K29.70, Gastritis, unspecified, without bleeding                        R10.13, Epigastric pain CPT copyright 2022 American Medical Association. All rights reserved. The codes documented in this report are preliminary and  upon coder review may  be revised to meet current compliance requirements. Ole Schick MD, MD 08/13/2024 12:10:51 PM Number of Addenda: 0 Note Initiated On: 08/13/2024 11:36 AM Estimated Blood Loss:  Estimated blood loss was minimal.      [facility]

## 2024-08-13 NOTE — Anesthesia Preprocedure Evaluation (Signed)
 Anesthesia Evaluation  Patient identified by MRN, date of birth, ID band Patient awake    Reviewed: Allergy & Precautions, NPO status , Patient's Chart, lab work & pertinent test results  History of Anesthesia Complications Negative for: history of anesthetic complications  Airway Mallampati: II  TM Distance: >3 FB Neck ROM: full    Dental  (+) Teeth Intact, Dental Advidsory Given   Pulmonary neg shortness of breath, asthma , sleep apnea and Continuous Positive Airway Pressure Ventilation , COPD (mild),  COPD inhaler, neg recent URI, Patient abstained from smoking.Not current smoker 5cm lung mass   Pulmonary exam normal breath sounds clear to auscultation       Cardiovascular Exercise Tolerance: Good METShypertension, Pt. on medications (-) angina (-) CAD, (-) Past MI and (-) Cardiac Stents Normal cardiovascular exam(-) dysrhythmias (-) Valvular Problems/Murmurs Rhythm:Regular Rate:Normal  echo: 12/2019: Left ventricular ejection fraction, by visual estimation, is 65 to  70%. The left ventricle has hyperdynamic function. There is no left  ventricular hypertrophy.;  ekg: 12/2019: nsr;   Neuro/Psych  Headaches, neg Seizures PSYCHIATRIC DISORDERS Anxiety Depression       GI/Hepatic Neg liver ROS,GERD  Medicated,,  Endo/Other  neg diabetes  Chronic steroid treatment  Renal/GU negative Renal ROS  negative genitourinary   Musculoskeletal   Abdominal  (+)  Abdomen: tender.   Peds  Hematology negative hematology ROS (+)   Anesthesia Other Findings Past Medical History: No date: Asthma 11/2019: COVID-19     Comment:  hospitalized x2 No date: GERD (gastroesophageal reflux disease) No date: Headache No date: Hypertension No date: Sleep apnea  Reproductive/Obstetrics                              Anesthesia Physical Anesthesia Plan  ASA: 3  Anesthesia Plan: General   Post-op Pain  Management:    Induction: Intravenous  PONV Risk Score and Plan: 4 or greater and Propofol  infusion, TIVA and Treatment may vary due to age or medical condition  Airway Management Planned: Natural Airway and Nasal Cannula  Additional Equipment: None  Intra-op Plan:   Post-operative Plan: Extubation in OR  Informed Consent: I have reviewed the patients History and Physical, chart, labs and discussed the procedure including the risks, benefits and alternatives for the proposed anesthesia with the patient or authorized representative who has indicated his/her understanding and acceptance.     Dental advisory given  Plan Discussed with: CRNA and Surgeon  Anesthesia Plan Comments: (Discussed risks of anesthesia with patient, including PONV, sore throat, lip/dental/eye damage. Rare risks discussed as well, such as cardiorespiratory and neurological sequelae, and allergic reactions. Discussed the role of CRNA in patient's perioperative care. Patient understands.)         Anesthesia Quick Evaluation

## 2024-08-13 NOTE — Transfer of Care (Signed)
 Immediate Anesthesia Transfer of Care Note  Patient: Jody Brewer  Procedure(s) Performed: COLONOSCOPY EGD (ESOPHAGOGASTRODUODENOSCOPY)  Patient Location: PACU  Anesthesia Type:General  Level of Consciousness: sedated  Airway & Oxygen Therapy: Patient Spontanous Breathing  Post-op Assessment: Report given to RN and Post -op Vital signs reviewed and stable  Post vital signs: Reviewed and stable  Last Vitals:  Vitals Value Taken Time  BP 93/39 08/13/24 12:13  Temp 36.6 C 08/13/24 12:11  Pulse 83 08/13/24 12:13  Resp 13 08/13/24 12:13  SpO2 100 % 08/13/24 12:13  Vitals shown include unfiled device data.  Last Pain:  Vitals:   08/13/24 1211  TempSrc: Temporal  PainSc: Asleep         Complications: No notable events documented.

## 2024-08-13 NOTE — Op Note (Signed)
 [facility] Gastroenterology Patient Name: Jody Brewer Procedure Date: 08/13/2024 11:35 AM MRN: [id number] Account #: [id number] Date of Birth: 06-Nov-1951 Admit Type: Outpatient Age: 73 Room: [facility] ENDO ROOM 3 Gender: Female Note Status: Finalized Instrument Name: Colon Scope [id number] Procedure:             Colonoscopy Indications:           Colon cancer screening in patient at increased risk:                         Family history of colon polyps in multiple 1st-degree                         relatives Providers:             Ole Schick MD, MD Referring MD:          Jody PHEBE Pinal, MD (Referring MD) Medicines:             Monitored Anesthesia Care Complications:         No immediate complications. Procedure:             Pre-Anesthesia Assessment:                        - Prior to the procedure, a History and Physical was                         performed, and patient medications and allergies were                         reviewed. The patient is competent. The risks and                         benefits of the procedure and the sedation options and                         risks were discussed with the patient. All questions                         were answered and informed consent was obtained.                         Patient identification and proposed procedure were                         verified by the physician, the nurse, the                         anesthesiologist, the anesthetist and the technician                         in the endoscopy suite. Mental Status Examination:                         alert and oriented. Airway Examination: normal                         oropharyngeal airway and neck mobility. Respiratory  Examination: clear to auscultation. CV Examination:                         normal. Prophylactic Antibiotics: The patient does not                         require prophylactic antibiotics. Prior                          Anticoagulants: The patient has taken no anticoagulant                         or antiplatelet agents. ASA Grade Assessment: III - A                         patient with severe systemic disease. After reviewing                         the risks and benefits, the patient was deemed in                         satisfactory condition to undergo the procedure. The                         anesthesia plan was to use monitored anesthesia care                         (MAC). Immediately prior to administration of                         medications, the patient was re-assessed for adequacy                         to receive sedatives. The heart rate, respiratory                         rate, oxygen saturations, blood pressure, adequacy of                         pulmonary ventilation, and response to care were                         monitored throughout the procedure. The physical                         status of the patient was re-assessed after the                         procedure.                        After obtaining informed consent, the colonoscope was                         passed under direct vision. Throughout the procedure,                         the patient's blood pressure, pulse, and oxygen  saturations were monitored continuously. The                         Colonoscope was introduced through the anus and                         advanced to the the terminal ileum, with                         identification of the appendiceal orifice and IC                         valve. The colonoscopy was somewhat difficult due to                         restricted mobility of the colon. The patient                         tolerated the procedure well. The quality of the bowel                         preparation was good. The terminal ileum, ileocecal                         valve, appendiceal orifice, and rectum were                          photographed. Findings:      The perianal and digital rectal examinations were normal.      The terminal ileum appeared normal.      Internal hemorrhoids were found during retroflexion. The hemorrhoids       were Grade I (internal hemorrhoids that do not prolapse).      The exam was otherwise without abnormality on direct and retroflexion       views. Impression:            - The examined portion of the ileum was normal.                        - Internal hemorrhoids.                        - The examination was otherwise normal on direct and                         retroflexion views.                        - No specimens collected. Recommendation:        - Discharge patient to home.                        - Resume previous diet.                        - Continue present medications.                        - Repeat colonoscopy in 5 years for screening purposes.                        -  Return to referring physician as previously                         scheduled. Procedure Code(s):     --- Professional ---                        H9894, Colorectal cancer screening; colonoscopy on                         individual at high risk Diagnosis Code(s):     --- Professional ---                        Z83.71, Family history of colonic polyps                        K64.0, First degree hemorrhoids CPT copyright 2022 American Medical Association. All rights reserved. The codes documented in this report are preliminary and upon coder review may  be revised to meet current compliance requirements. Ole Schick MD, MD 08/13/2024 12:16:34 PM Number of Addenda: 0 Note Initiated On: 08/13/2024 11:35 AM Scope Withdrawal Time: 0 hours 7 minutes 31 seconds  Total Procedure Duration: 0 hours 15 minutes 26 seconds  Estimated Blood Loss:  Estimated blood loss: none.      [facility]

## 2024-08-14 LAB — SURGICAL PATHOLOGY

## 2024-09-13 ENCOUNTER — Ambulatory Visit: Admitting: Urology

## 2024-09-13 ENCOUNTER — Telehealth: Payer: Self-pay | Admitting: Urology

## 2024-09-13 NOTE — Telephone Encounter (Signed)
 Error

## 2024-09-27 ENCOUNTER — Encounter: Payer: Self-pay | Admitting: Physician Assistant

## 2024-09-27 ENCOUNTER — Ambulatory Visit: Admitting: Physician Assistant

## 2024-09-27 VITALS — BP 146/79 | Ht 65.0 in | Wt 172.0 lb

## 2024-09-27 DIAGNOSIS — N390 Urinary tract infection, site not specified: Secondary | ICD-10-CM | POA: Diagnosis not present

## 2024-09-27 LAB — URINALYSIS, COMPLETE
Bilirubin, UA: NEGATIVE
Glucose, UA: NEGATIVE
Ketones, UA: NEGATIVE
Leukocytes,UA: NEGATIVE
Nitrite, UA: NEGATIVE
Protein,UA: NEGATIVE
RBC, UA: NEGATIVE
Specific Gravity, UA: 1.01 (ref 1.005–1.030)
Urobilinogen, Ur: 0.2 mg/dL (ref 0.2–1.0)
pH, UA: 6 (ref 5.0–7.5)

## 2024-09-27 LAB — MICROSCOPIC EXAMINATION: Epithelial Cells (non renal): >10 /HPF — AB (ref 0–10)

## 2024-09-27 NOTE — Progress Notes (Signed)
 09/27/2024 10:18 AM   Jody Brewer 1951/10/21 [id number]  CC: Chief Complaint  Patient presents with   Recurrent UTI   HPI: Jody Brewer is a 73 y.o. female with PMH recurrent UTI I with negative cystoscopy in November 2024 who presents today for follow-up on Hiprex .   Today she reports she is tolerating Hiprex  without any difficulty.  She ran out of estrogen cream and cranberry supplements about a month ago and decided to discontinue these.  She does not wish to resume them at this time.  Overall she is very pleased and feels that this is the longest she has gone without UTI symptoms since their onset about 2 years ago.  In-office UA pan negative; urine microscopy with >10 epithelial cells/hpf and moderate bacteria.   PMH: Past Medical History:  Diagnosis Date   AKI (acute kidney injury) 01/15/2020   Asthma    BOOP (bronchiolitis obliterans with organizing pneumonia) (HCC)    COVID-19 11/2019   hospitalized x2   DDD (degenerative disc disease), lumbosacral    Depression    Dizziness 01/02/2024   Fever 01/03/2024   GERD (gastroesophageal reflux disease)    Headache    Hyperlipidemia, mixed    Hyperlipidemia, mixed    Hypertension    Hypotension 01/02/2024   IBS (irritable bowel syndrome)    LPRD (laryngopharyngeal reflux disease)    Major depressive disorder, recurrent, mild    Multinodular goiter    Sleep apnea     Surgical History: Past Surgical History:  Procedure Laterality Date   ABDOMINAL HYSTERECTOMY     APPENDECTOMY     CATARACT EXTRACTION W/PHACO Left 06/10/2021   Procedure: CATARACT EXTRACTION PHACO AND INTRAOCULAR LENS PLACEMENT (IOC) LEFT;  Surgeon: Mittie Gaskin, MD;  Location: [facility];  Service: Ophthalmology;  Laterality: Left;  5.69 0:53.6 10.6%   CATARACT EXTRACTION W/PHACO Right 06/24/2021   Procedure: CATARACT EXTRACTION PHACO AND INTRAOCULAR LENS PLACEMENT (IOC) RIGHT;  Surgeon: Mittie Gaskin, MD;   Location: [facility];  Service: Ophthalmology;  Laterality: Right;  3.61 00:52.4   CHOLECYSTECTOMY     COLONOSCOPY N/A 08/13/2024   Procedure: COLONOSCOPY;  Surgeon: Maryruth Ole DASEN, MD;  Location: [facility] ENDOSCOPY;  Service: Endoscopy;  Laterality: N/A;   ELBOW BURSA SURGERY Right    ESOPHAGOGASTRODUODENOSCOPY N/A 08/13/2024   Procedure: EGD (ESOPHAGOGASTRODUODENOSCOPY);  Surgeon: Maryruth Ole DASEN, MD;  Location: [facility] ENDOSCOPY;  Service: Endoscopy;  Laterality: N/A;   FRENULECTOMY, LINGUAL     and removal of portion of soft palate   lSEPTOPLASTY AND BILATERAL TURBINATE REDUCTION     LUMBAR DISC SURGERY  1996   L3-L5   LUMBAR DISC SURGERY  2004   LUMBAR FUSION  2006   bone harvest rods (2) and screws (36)   LUMBAR LAMINECTOMY  1998   L3-L5   MAXILLARY ANTROSTOMY Right 08/12/2022   Procedure: MAXILLARY ANTROSTOMY REVISION;  Surgeon: Edda Mt, MD;  Location: [facility];  Service: ENT;  Laterality: Right;  placed disk on OR charge nurse desk 8-8 kp   OOPHORECTOMY     1 ovary removed   TOTAL KNEE ARTHROPLASTY Right 11/17/2021   Procedure: TOTAL KNEE ARTHROPLASTY;  Surgeon: Edie Norleen PARAS, MD;  Location: [facility] ORS;  Service: Orthopedics;  Laterality: Right;   UVULOPALATOPHARYNGOPLASTY  2001   XI ROBOTIC LAPAROSCOPIC ASSISTED APPENDECTOMY N/A 05/31/2023   Procedure: XI ROBOTIC LAPAROSCOPIC ASSISTED APPENDECTOMY;  Surgeon: Desiderio Schanz, MD;  Location: [facility] ORS;  Service: General;  Laterality: N/A;    Home  Medications:  Allergies as of 09/27/2024       Reactions   Nirmatrelvir-ritonavir Nausea Only    Severe nausea and vomiting.   Atorvastatin  Palpitations, Other (See Comments)   Muscle pain and severe twitching   Meloxicam Rash   Tolerates ibuprofen         Medication List        Accurate as of September 27, 2024 10:18 AM. If you have any questions, ask your nurse or doctor.          STOP taking these medications    estradiol  0.1 MG/GM vaginal  cream Commonly known as: ESTRACE  Stopped by: Lucie Hones       TAKE these medications    albuterol  108 (90 Base) MCG/ACT inhaler Commonly known as: VENTOLIN  HFA Inhale 2 puffs into the lungs every 4 (four) hours as needed for wheezing or shortness of breath.   ALPRAZolam  0.5 MG tablet Commonly known as: XANAX  Take 0.75 mg by mouth at bedtime.   celecoxib 200 MG capsule Commonly known as: CELEBREX Take 200 mg by mouth daily.   DULoxetine 30 MG capsule Commonly known as: CYMBALTA Take 1 tablet by mouth daily.   estradiol  0.5 MG tablet Commonly known as: ESTRACE  Take 0.5 mg by mouth daily.   losartan 50 MG tablet Commonly known as: COZAAR Take 50 mg by mouth daily.   methenamine  1 g tablet Commonly known as: Hiprex  Take 1 tablet (1 g total) by mouth 2 (two) times daily with a meal.   minoxidil 2.5 MG tablet Commonly known as: LONITEN Take 2.5 mg by mouth daily.   montelukast  10 MG tablet Commonly known as: SINGULAIR  Take 10 mg by mouth daily.   Multi-Vitamin tablet Take 1 tablet by mouth daily.   nortriptyline  10 MG capsule Commonly known as: PAMELOR  Take 20 mg by mouth at bedtime.   pantoprazole  40 MG tablet Commonly known as: PROTONIX  Take 40 mg by mouth 2 (two) times daily.   sucralfate 1 g tablet Commonly known as: CARAFATE Take 1 g by mouth 4 (four) times daily -  with meals and at bedtime.   Wixela Inhub 100-50 MCG/ACT Aepb Generic drug: fluticasone-salmeterol Inhale 1 puff into the lungs 2 (two) times daily.        Allergies:  Allergies  Allergen Reactions   Nirmatrelvir-Ritonavir Nausea Only     Severe nausea and vomiting.   Atorvastatin  Palpitations and Other (See Comments)    Muscle pain and severe twitching   Meloxicam Rash    Tolerates ibuprofen     Family History: Family History  Problem Relation Age of Onset   Emphysema Mother    Parkinson's disease Father    Dementia Father    Hypertension Father    Breast  cancer Neg Hx     Social History:   reports that she has never smoked. She has never been exposed to tobacco smoke. She has never used smokeless tobacco. She reports that she does not currently use alcohol after a past usage of about 3.0 standard drinks of alcohol per week. She reports that she does not currently use drugs.  Physical Exam: BP (!) 146/79   Ht 5' 5 (1.651 m)   Wt 172 lb (78 kg)   BMI 28.62 kg/m   Constitutional:  Alert and oriented, no acute distress, nontoxic appearing HEENT: [place], AT Cardiovascular: No clubbing, cyanosis, or edema Respiratory: Normal respiratory effort, no increased work of breathing Skin: No rashes, bruises or suspicious lesions Neurologic: Grossly intact, no focal  deficits, moving all 4 extremities Psychiatric: Normal mood and affect  Laboratory Data: Results for orders placed or performed in visit on 09/27/24  Microscopic Examination   Collection Time: 09/27/24  9:32 AM   Urine  Result Value Ref Range   WBC, UA 0-5 0 - 5 /hpf   RBC, Urine 0-2 0 - 2 /hpf   Epithelial Cells (non renal) >10 (A) 0 - 10 /hpf   Bacteria, UA Moderate (A) None seen/Few  Urinalysis, Complete   Collection Time: 09/27/24  9:32 AM  Result Value Ref Range   Specific Gravity, UA 1.010 1.005 - 1.030   pH, UA 6.0 5.0 - 7.5   Color, UA Yellow Yellow   Appearance Ur Clear Clear   Leukocytes,UA Negative Negative   Protein,UA Negative Negative/Trace   Glucose, UA Negative Negative   Ketones, UA Negative Negative   RBC, UA Negative Negative   Bilirubin, UA Negative Negative   Urobilinogen, Ur 0.2 0.2 - 1.0 mg/dL   Nitrite, UA Negative Negative   Microscopic Examination Comment    Microscopic Examination See below:    Assessment & Plan:   1. Recurrent UTI (Primary) She is doing exceptionally well on Hiprex , will continue this.  Okay to stay off estrogen cream for now, though we discussed that if she ever stops Hiprex  in the future I will want her to resume estrogen  cream at least 3 months prior.  She is in agreement with this plan. - Urinalysis, Complete  Return in about 6 months (around 03/28/2025) for Recurrent UTI follow-up.  Lucie Hones, PA-C  [facility] [place] [address] [place], [place] [address] [phone]

## 2024-10-13 ENCOUNTER — Other Ambulatory Visit: Payer: Self-pay | Admitting: Urology

## 2025-03-28 ENCOUNTER — Ambulatory Visit: Admitting: Physician Assistant
